# Patient Record
Sex: Female | Born: 1970
Health system: Southern US, Community
[De-identification: ages and names within clinical notes are randomized; demographics above are authoritative.]

## PROBLEM LIST (undated history)

## (undated) DIAGNOSIS — Z9884 Bariatric surgery status: Secondary | ICD-10-CM

## (undated) DIAGNOSIS — B2790 Infectious mononucleosis, unspecified without complication: Secondary | ICD-10-CM

## (undated) DIAGNOSIS — G4730 Sleep apnea, unspecified: Secondary | ICD-10-CM

## (undated) DIAGNOSIS — M48061 Spinal stenosis, lumbar region without neurogenic claudication: Secondary | ICD-10-CM

## (undated) DIAGNOSIS — Z8619 Personal history of other infectious and parasitic diseases: Secondary | ICD-10-CM

## (undated) DIAGNOSIS — I10 Essential (primary) hypertension: Secondary | ICD-10-CM

## (undated) DIAGNOSIS — J45909 Unspecified asthma, uncomplicated: Secondary | ICD-10-CM

## (undated) DIAGNOSIS — E119 Type 2 diabetes mellitus without complications: Secondary | ICD-10-CM

## (undated) HISTORY — DX: Type 2 diabetes mellitus without complications: E11.9

## (undated) HISTORY — PX: BREAST REDUCTION SURGERY: SHX8

## (undated) HISTORY — DX: Essential (primary) hypertension: I10

## (undated) HISTORY — PX: OTHER SURGICAL HISTORY: SHX169

## (undated) HISTORY — PX: CERVICAL LAMINECTOMY: SHX94

## (undated) HISTORY — PX: REDUCTION MAMMAPLASTY: SUR839

## (undated) HISTORY — DX: Spinal stenosis, lumbar region without neurogenic claudication: M48.061

## (undated) HISTORY — PX: DIAGNOSTIC LAPAROSCOPY: SUR761

---

## 2011-08-21 HISTORY — PX: GASTRIC BYPASS: SHX52

## 2014-11-28 DIAGNOSIS — I10 Essential (primary) hypertension: Secondary | ICD-10-CM | POA: Insufficient documentation

## 2014-11-28 DIAGNOSIS — Z9884 Bariatric surgery status: Secondary | ICD-10-CM | POA: Insufficient documentation

## 2015-04-30 ENCOUNTER — Ambulatory Visit (INDEPENDENT_AMBULATORY_CARE_PROVIDER_SITE_OTHER): Payer: BLUE CROSS/BLUE SHIELD | Admitting: Sports Medicine

## 2015-04-30 ENCOUNTER — Ambulatory Visit (INDEPENDENT_AMBULATORY_CARE_PROVIDER_SITE_OTHER): Payer: BLUE CROSS/BLUE SHIELD

## 2015-04-30 ENCOUNTER — Encounter: Payer: Self-pay | Admitting: Sports Medicine

## 2015-04-30 VITALS — BP 107/71 | HR 76 | Ht 67.0 in | Wt 184.0 lb

## 2015-04-30 DIAGNOSIS — Z9889 Other specified postprocedural states: Secondary | ICD-10-CM | POA: Insufficient documentation

## 2015-04-30 DIAGNOSIS — M4322 Fusion of spine, cervical region: Secondary | ICD-10-CM | POA: Diagnosis not present

## 2015-04-30 DIAGNOSIS — I10 Essential (primary) hypertension: Secondary | ICD-10-CM

## 2015-04-30 DIAGNOSIS — Z0000 Encounter for general adult medical examination without abnormal findings: Secondary | ICD-10-CM

## 2015-04-30 DIAGNOSIS — M8588 Other specified disorders of bone density and structure, other site: Secondary | ICD-10-CM

## 2015-04-30 MED ORDER — PREDNISONE 50 MG PO TABS: ORAL_TABLET | ORAL | Status: DC

## 2015-04-30 MED ORDER — LOSARTAN POTASSIUM 100 MG PO TABS: 100.0000 mg | ORAL_TABLET | Freq: Every day | ORAL | Status: DC

## 2015-04-30 NOTE — Assessment & Plan Note (Signed)
Present, x-rays, formal physical therapy.  Return in one month, we'll probably add gabapentin, and get an MR for interventional planning if no better. Symptoms seem somewhat higher up.

## 2015-04-30 NOTE — Assessment & Plan Note (Signed)
Refilling losartan, we will watch this over the next several visits.

## 2015-04-30 NOTE — Progress Notes (Signed)
  Subjective:    CC: Establish care.   HPI:  Neck pain: With right radicular pain, history of C5-C7 ACDF, overall did well after surgery, however she continued to have right hand paresthesias. Unfortunately pain is worsened significantly over the past few months, and is worse with prolonged downgaze. Moderate, persistent. No lower extremity symptoms.  Essential hypertension: Needs a refill on losartan  Past medical history, Surgical history, Family history not pertinant except as noted below, Social history, Allergies, and medications have been entered into the medical record, reviewed, and no changes needed.   Review of Systems: No headache, visual changes, nausea, vomiting, diarrhea, constipation, dizziness, abdominal pain, skin rash, fevers, chills, night sweats, swollen lymph nodes, weight loss, chest pain, body aches, joint swelling, muscle aches, shortness of breath, mood changes, visual or auditory hallucinations.  Objective:    General: Well Developed, well nourished, and in no acute distress.  Neuro: Alert and oriented x3, extra-ocular muscles intact, sensation grossly intact.  HEENT: Normocephalic, atraumatic, pupils equal round reactive to light, neck supple, no masses, no lymphadenopathy, thyroid nonpalpable.  Skin: Warm and dry, no rashes noted.  Cardiac: Regular rate and rhythm, no murmurs rubs or gallops.  Respiratory: Clear to auscultation bilaterally. Not using accessory muscles, speaking in full sentences.  Abdominal: Soft, nontender, nondistended, positive bowel sounds, no masses, no organomegaly.  Neck: Negative spurling's Full neck range of motion Grip strength and sensation normal in bilateral hands Strength good C4 to T1 distribution No sensory change to C4 to T1 Reflexes are all somewhat hypoactive on the right compared to the left  Impression and Recommendations:    The patient was counselled, risk factors were discussed, anticipatory guidance given.

## 2015-04-30 NOTE — Assessment & Plan Note (Signed)
Routine blood work was done in December of last year, we will recheck blood work at the end of 2016

## 2015-05-09 ENCOUNTER — Ambulatory Visit (INDEPENDENT_AMBULATORY_CARE_PROVIDER_SITE_OTHER): Payer: BLUE CROSS/BLUE SHIELD | Admitting: Physical Therapy

## 2015-05-09 ENCOUNTER — Encounter: Payer: Self-pay | Admitting: Physical Therapy

## 2015-05-09 DIAGNOSIS — R52 Pain, unspecified: Secondary | ICD-10-CM | POA: Diagnosis not present

## 2015-05-09 DIAGNOSIS — R293 Abnormal posture: Secondary | ICD-10-CM

## 2015-05-09 DIAGNOSIS — M5382 Other specified dorsopathies, cervical region: Secondary | ICD-10-CM | POA: Diagnosis not present

## 2015-05-09 NOTE — Patient Instructions (Signed)
Shoulder Press    [place] [phone]   Press both shoulders down. Hold 5___ seconds. Repeat _10__ times.  Surface: floor   Copyright  VHI. All rights reserved.  Head Press With [place] chin SLIGHTLY toward chest, keep mouth closed. Feel weight on back of head. Increase weight by pressing head down. Hold _5__ seconds. Relax. Repeat _10__ times. Surface: floor   Scapula Adduction With Pectorals, Low   Stand in doorframe with palms against frame and arms at 45. Lean forward and squeeze shoulder blades. Hold _30__ seconds. Repeat _1__ times per session. Do _1__ sessions per day.  Scapula Adduction With Pectorals, Mid-Range   Stand in doorframe with palms against frame and arms at 90. Lean forward and squeeze shoulder blades. Hold _45__ seconds. Repeat _1__ times per session. Do __1_ sessions per day.  Shoulder Rotation: Double Arm   On back, knees bent, feet flat, elbows tucked at sides, bent 90, hands palms up. Pull hands apart and down toward floor, keeping elbows near sides. Hold momentarily. Slowly return to starting position. Repeat 2x10__ times. Band color __red____

## 2015-05-09 NOTE — Therapy (Signed)
[place], [place], [address] Phone: [phone]   Fax:  [phone]  Physical Therapy Evaluation  Patient Details  Name: Denise Gay MRN: [id number] Date of Birth: 03-14-1971 Referring Provider:  Silverio Decamp,*  Encounter Date: 05/09/2015      PT End of Session - 05/09/15 1650    Visit Number 1   Number of Visits 8   Date for PT Re-Evaluation 06/06/15   PT Start Time 6712   PT Stop Time 4580   PT Time Calculation (min) 46 min   Activity Tolerance Patient tolerated treatment well      Past Medical History  Diagnosis Date  . Hypertension   . Diabetes mellitus without complication     Past Surgical History  Procedure Laterality Date  . Gastric bypass  08/2011    There were no vitals filed for this visit.  Visit Diagnosis:  Weakness of neck - Plan: PT plan of care cert/re-cert  Pain of multiple sites - Plan: PT plan of care cert/re-cert  Abnormal posture - Plan: PT plan of care cert/re-cert      Subjective Assessment - 05/09/15 1609    Subjective Pt reports neck surgery in 2007 until 5 months began having tingling into Rt UE into 2nd and 3rd finger, most of the pain is in the upper trap area.    Pertinent History prednisone x 5 days, Saturday and the pain is returning.    How long can you sit comfortably? no limitations except for turning her head.    How long can you walk comfortably? no limitations   Diagnostic tests x-ray   Patient Stated Goals pt wishes to figure out whats wrong, decrease pain and prevent surgery.    Currently in Pain? Yes   Pain Score 2    Pain Location Arm   Pain Orientation Right   Pain Descriptors / Indicators Stabbing;Numbness   Pain Type Chronic pain            [facility] PT Assessment - 05/09/15 0001    Assessment   Medical Diagnosis h/o cervical discectomy   Onset Date/Surgical Date 12/09/14   Hand Dominance Left   Next MD Visit  05/28/15   Prior Therapy not for this    Precautions   Precautions None   Balance Screen   Has the patient fallen in the past 6 months No   Has the patient had a decrease in activity level because of a fear of falling?  No   Is the patient reluctant to leave their home because of a fear of falling?  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    Leisure go to [employer], swim.    Observation/Other Assessments   Focus on Therapeutic Outcomes (FOTO)  57% limited   Posture/Postural Control   Posture/Postural Control Postural limitations   Postural Limitations Rounded Shoulders;Forward head   ROM / Strength   AROM / PROM / Strength AROM;Strength   AROM   Overall AROM  --  bilat UE's WNL   AROM Assessment Site Cervical   Cervical Flexion 55   Cervical Extension 35   Cervical - Right Side Bend 25  pain at endrange   Cervical - Left Side Bend 20   Cervical - Right Rotation 45   Cervical - Left Rotation 55  Strength   Overall Strength --  bilat UE's WNL   Overall Strength Comments mid traps Rt 4+/5, Lt 4/5, low traps 4/5   Palpation   Palpation comment significant tightness and tenderness in Rt upper trap/levator   Special Tests    Special Tests Cervical   Cervical Tests Spurling's   Spurling's   Findings Negative                   [facility] Adult PT Treatment/Exercise - 05/09/15 0001    Exercises   Exercises Neck   Neck Exercises: Theraband   Shoulder External Rotation Red;20 reps  in hooklying   Neck Exercises: Standing   Other Standing Exercises door way stretch low and mid   Neck Exercises: Supine   Cervical Isometrics 10 reps  head presses   Other Supine Exercise 10 reps shoulder presses   Modalities   Modalities Ultrasound   Ultrasound   Ultrasound Location Rt upper trap/levator   Ultrasound Parameters combo, 100% 3.35mhz,  1.5w/cm2   Ultrasound Goals Pain  tightness                PT Education - 05/09/15 1631    Education provided Yes   Education Details HEP and work station set up   [employer]) Educated Patient   Methods Explanation;Demonstration;Handout   Comprehension Verbalized understanding;Returned demonstration             PT Long Term Goals - 05/09/15 1654    PT LONG TERM GOAL #1   Title I with postural ther ex ( 06/06/15)   Time 4   Period Weeks   Status New   PT LONG TERM GOAL #2   Title increase strength upper back =/> 5-/5 ( 06/06/15)   Time 4   Period Weeks   Status New   PT LONG TERM GOAL #3   Title report pain decrease =/> 75 % in Rt UE ( 06/06/15)   Time 4   Period Weeks   Status New   PT LONG TERM GOAL #4   Title improve FOTO =/< 41% limited ( 06/06/15)    Time 4   Period Weeks   Status New   PT LONG TERM GOAL #5   Title increase cervical rotation biilat =/> 60 degrees ( 06/06/15)   Time 4   Period Weeks   Status New               Plan - 05/09/15 1651    Clinical Impression Statement 44 y/o female presents with postural changes, weakness and trigger points that are pulling on her Rt upper shoulder and possibly causing nerve pinching and symptoms into her Rt UE. 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   Pt will benefit from skilled therapeutic intervention in order to improve on the following deficits Postural dysfunction;Decreased strength;Pain;Decreased range of motion   Rehab Potential Good   PT Frequency 2x / week   PT Duration 4 weeks   PT Treatment/Interventions Ultrasound;Moist Heat;Therapeutic exercise;Taping;Manual techniques;Neuromuscular re-education;Cryotherapy;Electrical Stimulation;Patient/family education;Passive range of motion   PT Next Visit Plan progress postural realignment, TPR    Consulted and Agree with Plan of Care Patient         Problem List Patient Active Problem List   Diagnosis Date Noted  . H/O C5-C7  ACDF with adjacent level disease 04/30/2015  . Annual physical exam 04/30/2015  . History of Roux-en-Y gastric bypass 11/28/2014  . Essential hypertension, benign 11/28/2014    Manuela Schwartz  Charlii Yost PT  05/09/2015, 4:58 PM  [facility] [place], [place], [address] Phone: [phone]   Fax:  [phone]

## 2015-05-14 ENCOUNTER — Ambulatory Visit (INDEPENDENT_AMBULATORY_CARE_PROVIDER_SITE_OTHER): Payer: BLUE CROSS/BLUE SHIELD | Admitting: Physical Therapy

## 2015-05-14 ENCOUNTER — Encounter: Payer: Self-pay | Admitting: Physical Therapy

## 2015-05-14 DIAGNOSIS — R293 Abnormal posture: Secondary | ICD-10-CM | POA: Diagnosis not present

## 2015-05-14 DIAGNOSIS — M5382 Other specified dorsopathies, cervical region: Secondary | ICD-10-CM | POA: Diagnosis not present

## 2015-05-14 DIAGNOSIS — R52 Pain, unspecified: Secondary | ICD-10-CM | POA: Diagnosis not present

## 2015-05-14 NOTE — Therapy (Signed)
[place], [place], [address] Phone: [phone]   Fax:  [phone]  Physical Therapy Treatment  Patient Details  Name: Denise Gay MRN: [id number] Date of Birth: Jan 03, 1971 Referring Provider:  Silverio Decamp,*  Encounter Date: 05/14/2015      PT End of Session - 05/14/15 1558    Visit Number 2   Number of Visits 8   Date for PT Re-Evaluation 06/06/15   PT Start Time 8811   PT Stop Time 0315   PT Time Calculation (min) 54 min      Past Medical History  Diagnosis Date  . Hypertension   . Diabetes mellitus without complication     Past Surgical History  Procedure Laterality Date  . Gastric bypass  08/2011    There were no vitals filed for this visit.  Visit Diagnosis:  Weakness of neck  Pain of multiple sites  Abnormal posture      Subjective Assessment - 05/14/15 1558    Subjective Pt reports she is having pain on both sides of her neck.  It began Friday. Doesn 't feel like she can do too much for her work station .    Currently in Pain? Yes   Pain Score 5    Pain Location Neck   Pain Orientation Left;Right   Pain Descriptors / Indicators Sharp;Burning   Pain Radiating Towards into both arms Rt to fingers, Lt just below the elbow   Pain Onset In the past 7 days   Aggravating Factors  working on her computer monitors.    Pain Relieving Factors moved TV to be in front of her            [facility] PT Assessment - 05/14/15 0001    Assessment   Medical Diagnosis h/o cervical discectomy   Onset Date/Surgical Date 12/09/14   Next MD Visit 05/28/15   AROM   Cervical - Right Rotation 56   Cervical - Left Rotation 50                     [facility] Adult PT Treatment/Exercise - 05/14/15 0001    Neck Exercises: Machines for Strengthening   UBE (Upper Arm Bike) L1 x 2'/2'   Neck Exercises: Theraband   Other Theraband Exercises in hooklying, with yellow band.  overrhead, horizontal abduction, SASH, increased difficulty with Lt side today.    Neck Exercises: Supine   Other Supine Exercise 20 reps thoracic lifts   Ultrasound   Ultrasound Location Lt and Rt upper trap   Ultrasound Parameters combo, 7' each side, 100%   Ultrasound Goals Pain  tightness   Manual Therapy   Manual Therapy Soft tissue mobilization   Soft tissue mobilization bilat UT and levators, STW to bilat pecs and cervical muscle stretching                     PT Long Term Goals - 05/14/15 1558    PT LONG TERM GOAL #1   Title I with postural ther ex ( 06/06/15)   Status On-going   PT LONG TERM GOAL #2   Title increase strength upper back =/> 5-/5 ( 06/06/15)   Status On-going   PT LONG TERM GOAL #3   Title report pain decrease =/> 75 % in Rt UE ( 06/06/15)   Status On-going   PT LONG TERM GOAL #4   Title improve FOTO =/< 41% limited ( 06/06/15)  Status On-going   PT LONG TERM GOAL #5   Title increase cervical rotation biilat =/> 60 degrees ( 06/06/15)   Status On-going               Plan - 05/14/15 1657    Clinical Impression Statement Pt had increased pain over the weekend however she also had increased stress as she was fostering a cat.  She is tight in bilat upper traps Rt > Lt, No goals met, only second visit   Pt will benefit from skilled therapeutic intervention in order to improve on the following deficits Postural dysfunction;Decreased strength;Pain;Decreased range of motion   Rehab Potential Good   PT Frequency 2x / week   PT Duration 4 weeks   PT Next Visit Plan progress postural realignment, TPR    Consulted and Agree with Plan of Care Patient        Problem List Patient Active Problem List   Diagnosis Date Noted  . H/O C5-C7 ACDF with adjacent level disease 04/30/2015  . Annual physical exam 04/30/2015  . History of Roux-en-Y gastric bypass 11/28/2014  . Essential hypertension, benign 11/28/2014    Jeral Pinch PT 05/14/2015,  5:01 PM  [facility] [place], [place], [address] Phone: [phone]   Fax:  [phone]

## 2015-05-16 ENCOUNTER — Ambulatory Visit (INDEPENDENT_AMBULATORY_CARE_PROVIDER_SITE_OTHER): Payer: BLUE CROSS/BLUE SHIELD | Admitting: Rehabilitative and Restorative Service Providers"

## 2015-05-16 ENCOUNTER — Encounter: Payer: Self-pay | Admitting: Rehabilitative and Restorative Service Providers"

## 2015-05-16 DIAGNOSIS — M5382 Other specified dorsopathies, cervical region: Secondary | ICD-10-CM

## 2015-05-16 DIAGNOSIS — R52 Pain, unspecified: Secondary | ICD-10-CM | POA: Diagnosis not present

## 2015-05-16 DIAGNOSIS — R293 Abnormal posture: Secondary | ICD-10-CM | POA: Diagnosis not present

## 2015-05-16 NOTE — Therapy (Signed)
[place], [place], [address] Phone: [phone]   Fax:  [phone]  Physical Therapy Treatment  Patient Details  Name: Denise Gay MRN: [id number] Date of Birth: February 08, 1971 Referring Provider:  Silverio Decamp,*  Encounter Date: 05/16/2015      PT End of Session - 05/16/15 1202    Visit Number 3   Number of Visits 8   Date for PT Re-Evaluation 06/06/15   PT Start Time 0935   PT Stop Time 1032   PT Time Calculation (min) 57 min   Activity Tolerance Patient tolerated treatment well      Past Medical History  Diagnosis Date  . Hypertension   . Diabetes mellitus without complication     Past Surgical History  Procedure Laterality Date  . Gastric bypass  08/2011    There were no vitals filed for this visit.  Visit Diagnosis:  Weakness of neck  Pain of multiple sites  Abnormal posture      Subjective Assessment - 05/16/15 1133    Subjective Pain in the Lt more than the Rt today. She has requested an evaluation of her work station. Will see ophthalmologist to ask about computer glasses to decrease axial extension wihen at [facility] station.   Currently in Pain? Yes   Pain Score 5    Pain Location Neck   Pain Orientation Left;Right   Pain Descriptors / Indicators Sharp;Burning   Pain Type Chronic pain   Pain Onset More than a month ago   Pain Frequency Constant             [facility] Adult PT Treatment/Exercise - 05/16/15 0001    Self-Care   Self-Care --  education re head position/glasses at computer   Neuro Re-ed    Neuro Re-ed Details  working on posture and alignment; positioin of head and neck; chin tuck; chest lift; posture   Neck Exercises: Standing   Other Standing Exercises doorway 3 positioins 3 reps 20 sec each   Other Standing Exercises scap squeeze around noodle/work on posture   Neck Exercises: Supine   Neck Retraction 10 reps;10 secs  difficulty  achieving neutral spine position   Cryotherapy   Number Minutes Cryotherapy 12 Minutes   Cryotherapy Location Cervical   Type of Cryotherapy Ice pack   Ultrasound   Ultrasound Location Lt/Rtposterior/lateral cervical musculature; upper trap   Ultrasound Parameters 1.5 w/cm2; 100% 1 MHz   Ultrasound Goals Pain   Manual Therapy   Manual Therapy Soft tissue mobilization;Myofascial release;Manual Traction   Soft tissue mobilization ant/lat/post cervical musculature; pecs; upper traps   Myofascial Release chest; traps; leveator   Manual Traction cervical traction working on neutral position for chin/head/neck                PT Education - 05/16/15 1200    Education provided Yes   Education Details suggestions for glasses for work; postural correction; chin tuck; chest lift; stretch for pec in doorway; passive pec stretch with noodle in standing; golf balls for occipital inhibition   Person(s) Educated Patient   Methods Explanation;Demonstration;Tactile cues;Verbal cues;Handout   Comprehension Verbalized understanding;Returned demonstration;Verbal cues required;Tactile cues required             PT Long Term Goals - 05/14/15 1558    PT LONG TERM GOAL #1   Title I with postural ther ex ( 06/06/15)   Status On-going   PT LONG TERM GOAL #2   Title increase strength upper  back =/> 5-/5 ( 06/06/15)   Status On-going   PT LONG TERM GOAL #3   Title report pain decrease =/> 75 % in Rt UE ( 06/06/15)   Status On-going   PT LONG TERM GOAL #4   Title improve FOTO =/< 41% limited ( 06/06/15)    Status On-going   PT LONG TERM GOAL #5   Title increase cervical rotation biilat =/> 60 degrees ( 06/06/15)   Status On-going               Plan - 05/16/15 1203    Clinical Impression Statement Continued pain in neck and shoulders, Lt > Rt. Discussed posture and alignment of head and neck at work. Pt has bifocals and likely tips head to see computer screen creating static position of  neck. 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 Education re-posture and alignment; HEP.   Pt will benefit from skilled therapeutic intervention in order to improve on the following deficits Postural dysfunction;Decreased strength;Pain;Decreased range of motion   Rehab Potential Good   PT Frequency 2x / week   PT Duration 4 weeks   PT Treatment/Interventions Ultrasound;Moist Heat;Therapeutic exercise;Taping;Manual techniques;Neuromuscular re-education;Cryotherapy;Electrical Stimulation;Patient/family education;Passive range of motion   PT Next Visit Plan continue work on postural correction; axial extension; pec stretches; posterior shoulcer girdel strengthening; manual work C-spine   PT Home Exercise Plan postural work; HEP   Consulted and Agree with Plan of Care Patient        Problem List Patient Active Problem List   Diagnosis Date Noted  . H/O C5-C7 ACDF with adjacent level disease 04/30/2015  . Annual physical exam 04/30/2015  . History of Roux-en-Y gastric bypass 11/28/2014  . Essential hypertension, benign 11/28/2014    Everardo All, PT, MPH 05/16/2015, 12:10 PM  [facility] [place], [place], [address] Phone: [phone]   Fax:  [phone]

## 2015-05-16 NOTE — Patient Instructions (Signed)
Lying on back - golf balls at skull on either side of spine 2-5 min    Scapula Adduction With Pectorals, Low   Stand in doorframe with palms against frame and arms at 45. Lean forward and squeeze shoulder blades. Hold __20-30_ seconds. Repeat _3__ times per session. Do _3-4__ sessions per day.  Copyright  VHI. All rights reserved.    Scapula Adduction With Pectorals, Mid-Range   Stand in doorframe with palms against frame and arms at 90. Lean forward and squeeze shoulder blades. Hold _20-30__ seconds. Repeat _3__ times per session. Do _3-4__ sessions per day. \Scapula Adduction With Pectorals, High   Stand in doorframe with palms against frame and arms at 120. Lean forward and squeeze shoulder blades. Hold _20-30__ seconds. Repeat _3__ times per session. Do _3-4__ sessions per day.

## 2015-05-17 ENCOUNTER — Encounter: Payer: BLUE CROSS/BLUE SHIELD | Admitting: Physical Therapy

## 2015-05-21 ENCOUNTER — Ambulatory Visit (INDEPENDENT_AMBULATORY_CARE_PROVIDER_SITE_OTHER): Payer: BLUE CROSS/BLUE SHIELD | Admitting: Physical Therapy

## 2015-05-21 DIAGNOSIS — R293 Abnormal posture: Secondary | ICD-10-CM | POA: Diagnosis not present

## 2015-05-21 DIAGNOSIS — M5382 Other specified dorsopathies, cervical region: Secondary | ICD-10-CM

## 2015-05-21 DIAGNOSIS — R52 Pain, unspecified: Secondary | ICD-10-CM

## 2015-05-21 NOTE — Patient Instructions (Signed)
.  Sash   On back, knees bent, feet flat, left hand on left hip, right hand above left. Pull right arm DIAGONALLY (hip to shoulder) across chest. Bring right arm along head toward floor. Hold momentarily. Slowly return to starting position. Repeat _10__ times. Do with left arm. Band color _yellow_____  (thumb pointed up)   Resisted Horizontal Abduction: Bilateral   While on back, place tubing in both hands, arms out in front. Keeping arms straight, pinch shoulder blades together and stretch arms out. Repeat _10___ times per set. Do __2__ sets per session. Do __1__ sessions per day. Yellow band

## 2015-05-21 NOTE — Therapy (Signed)
[place], [place], [address] Phone: [phone]   Fax:  [phone]  Physical Therapy Treatment  Patient Details  Name: Denise Gay MRN: [id number] Date of Birth: 01-12-1971 Referring Provider:  Silverio Decamp,*  Encounter Date: 05/21/2015      PT End of Session - 05/21/15 1602    Visit Number 4   Number of Visits 8   Date for PT Re-Evaluation 06/06/15   PT Start Time 1602   PT Stop Time 1702   PT Time Calculation (min) 60 min      Past Medical History  Diagnosis Date  . Hypertension   . Diabetes mellitus without complication     Past Surgical History  Procedure Laterality Date  . Gastric bypass  08/2011    There were no vitals filed for this visit.  Visit Diagnosis:  Weakness of neck  Pain of multiple sites  Abnormal posture      Subjective Assessment - 05/21/15 1603    Subjective Pt reports she is still experiencing pain in neck and tingling down to fingers (1st/2nd finger Rt hand, 3rd/4th finger on Lt).  "Not as bad as last week". Pt reports she needs a MD note to have work station assessed.    Currently in Pain? Yes   Pain Score 2    Pain Location Neck   Pain Orientation Right;Left;Lower   Pain Descriptors / Indicators Burning;Sharp   Aggravating Factors  working on computer    Pain Relieving Factors hot tub use, massage, medicine.             [facility] PT Assessment - 05/21/15 0001    Assessment   Medical Diagnosis h/o cervical discectomy   Onset Date/Surgical Date 12/09/14   Hand Dominance Left   Next MD Visit 05/28/15   AROM   AROM Assessment Site Cervical   Cervical Flexion 45   Cervical Extension 50   Cervical - Right Side Bend 35   Cervical - Left Side Bend 35   Cervical - Right Rotation 65   Cervical - Left Rotation 55                     [facility] Adult PT Treatment/Exercise - 05/21/15 0001    Exercises   Exercises Neck   Neck  Exercises: Machines for Strengthening   UBE (Upper Arm Bike) L1: 1 min each way up to 4 min .   Neck Exercises: Theraband   Shoulder External Rotation 10 reps;Red  2 sets, supine   Horizontal ABduction 10 reps  yellow band, supine. 2 sets   Other Theraband Exercises In hooklying: yellow band x 10 reps for bilateral shoulder flex, sash each arm - no irritation to next this time.    Neck Exercises: Standing   Other Standing Exercises scap squeeze around noodle/work on posture- 5 sec hold x 10    Modalities   Modalities Cryotherapy   Cryotherapy   Number Minutes Cryotherapy 12 Minutes   Cryotherapy Location Cervical   Type of Cryotherapy Ice pack   Manual Therapy   Manual Therapy Soft tissue mobilization;Myofascial release;Manual Traction   Soft tissue mobilization ant/lat/post cervical musculature;  upper traps   Manual Traction cervical traction working on neutral position for chin/head/neck   Neck Exercises: Stretches   Upper Trapezius Stretch 3 reps;20 seconds   Corner Stretch 2 reps;20 seconds   Corner Stretch Limitations doorway stretch 3 angles bilateral arms; required multiple cues to correct form.  PT Education - 05/21/15 1702    Education provided Yes   Education Details HEP    Person(s) Educated Patient   Methods Explanation;Handout;Demonstration   Comprehension Returned demonstration;Verbalized understanding;Tactile cues required             PT Long Term Goals - 05/14/15 1558    PT LONG TERM GOAL #1   Title I with postural ther ex ( 06/06/15)   Status On-going   PT LONG TERM GOAL #2   Title increase strength upper back =/> 5-/5 ( 06/06/15)   Status On-going   PT LONG TERM GOAL #3   Title report pain decrease =/> 75 % in Rt UE ( 06/06/15)   Status On-going   PT LONG TERM GOAL #4   Title improve FOTO =/< 41% limited ( 06/06/15)    Status On-going   PT LONG TERM GOAL #5   Title increase cervical rotation biilat =/> 60 degrees ( 06/06/15)    Status On-going               Plan - 05/21/15 1634    Clinical Impression Statement Pt demo improved cervical ROM this visit. Pt able to tolerate ther ex better than 2 visits ago; no reported increase in pain.  Making good gains towards goals.    Pt will benefit from skilled therapeutic intervention in order to improve on the following deficits Postural dysfunction;Decreased strength;Pain;Decreased range of motion   Rehab Potential Good   PT Frequency 2x / week   PT Duration 4 weeks   PT Treatment/Interventions Ultrasound;Moist Heat;Therapeutic exercise;Taping;Manual techniques;Neuromuscular re-education;Cryotherapy;Electrical Stimulation;Patient/family education;Passive range of motion   PT Next Visit Plan continue work on postural correction; axial extension; pec stretches; posterior shoulder girdel strengthening; manual work C-spine   Consulted and Agree with Plan of Care Patient        Problem List Patient Active Problem List   Diagnosis Date Noted  . H/O C5-C7 ACDF with adjacent level disease 04/30/2015  . Annual physical exam 04/30/2015  . History of Roux-en-Y gastric bypass 11/28/2014  . Essential hypertension, benign 11/28/2014    Kerin Perna, PTA 05/21/2015 5:09 PM  [place], [place], [address] Phone: [phone]   Fax:  [phone]

## 2015-05-23 ENCOUNTER — Encounter: Payer: Self-pay | Admitting: Physical Therapy

## 2015-05-23 ENCOUNTER — Ambulatory Visit (INDEPENDENT_AMBULATORY_CARE_PROVIDER_SITE_OTHER): Payer: BLUE CROSS/BLUE SHIELD | Admitting: Physical Therapy

## 2015-05-23 DIAGNOSIS — R52 Pain, unspecified: Secondary | ICD-10-CM

## 2015-05-23 DIAGNOSIS — R293 Abnormal posture: Secondary | ICD-10-CM | POA: Diagnosis not present

## 2015-05-23 DIAGNOSIS — M5382 Other specified dorsopathies, cervical region: Secondary | ICD-10-CM | POA: Diagnosis not present

## 2015-05-23 NOTE — Therapy (Addendum)
[place], [place], [address] Phone: [phone]   Fax:  [phone]  Physical Therapy Treatment  Patient Details  Name: Joliet Mallozzi MRN: [id number] Date of Birth: 04-May-1971 Referring Provider:  Silverio Decamp,*  Encounter Date: 05/23/2015      PT End of Session - 05/23/15 1605    Visit Number 5   Number of Visits 8   Date for PT Re-Evaluation 06/06/15   PT Start Time 9163   PT Stop Time 8466   PT Time Calculation (min) 49 min      Past Medical History  Diagnosis Date  . Hypertension   . Diabetes mellitus without complication     Past Surgical History  Procedure Laterality Date  . Gastric bypass  08/2011    There were no vitals filed for this visit.  Visit Diagnosis:  Weakness of neck  Pain of multiple sites  Abnormal posture      Subjective Assessment - 05/23/15 1605    Subjective Pt reports tingling is still into her fingers, the pain is worse today.  Was fine over the weekend.    Currently in Pain? Yes   Pain Score 4    Pain Location Neck   Pain Orientation Right   Pain Type Chronic pain   Pain Radiating Towards neck and tingling constant, arm pain intermittent.    Aggravating Factors  working on the computer   Pain Relieving Factors med, hot tub.                          [facility] Adult PT Treatment/Exercise - 05/23/15 0001    Neck Exercises: Machines for Strengthening   UBE (Upper Arm Bike) L2x 2'/2'   Neck Exercises: Standing   Other Standing Exercises scap squeeze around noodle/work on posture- FWD reach with arms to 90 degrees, the horizontal abduction.    Modalities   Modalities Cryotherapy;Electrical Stimulation   Cryotherapy   Number Minutes Cryotherapy 15 Minutes   Cryotherapy Location Cervical   Type of Cryotherapy Ice pack   Electrical Stimulation   Electrical Stimulation Location cervical    Electrical Stimulation Action IFC    Electrical Stimulation Parameters to tolerance   Electrical Stimulation Goals Pain   Manual Therapy   Manual Therapy Soft tissue mobilization;Myofascial release;Manual Traction   Soft tissue mobilization ant/lat/post cervical musculature;  upper traps   Myofascial Release chest; traps; leveator   Manual Traction cervical traction working on neutral position for chin/head/neck                     PT Long Term Goals - 05/14/15 1558    PT LONG TERM GOAL #1   Title I with postural ther ex ( 06/06/15)   Status On-going   PT LONG TERM GOAL #2   Title increase strength upper back =/> 5-/5 ( 06/06/15)   Status On-going   PT LONG TERM GOAL #3   Title report pain decrease =/> 75 % in Rt UE ( 06/06/15)   Status On-going   PT LONG TERM GOAL #4   Title improve FOTO =/< 41% limited ( 06/06/15)    Status On-going   PT LONG TERM GOAL #5   Title increase cervical rotation biilat =/> 60 degrees ( 06/06/15)   Status On-going               Plan - 05/23/15 1641    Clinical Impression Statement Pt  reports some increased symptoms in her neck today. Felt really good after last Wed tx and into the weekend.  She is planning on making an appointment with the eye MD to see about computer glassess.    Pt will benefit from skilled therapeutic intervention in order to improve on the following deficits Postural dysfunction;Decreased strength;Pain;Decreased range of motion   Rehab Potential Good   PT Frequency 2x / week   PT Duration 4 weeks   PT Treatment/Interventions Ultrasound;Moist Heat;Therapeutic exercise;Taping;Manual techniques;Neuromuscular re-education;Cryotherapy;Electrical Stimulation;Patient/family education;Passive range of motion   PT Next Visit Plan continue work on postural correction; axial extension; pec stretches; posterior shoulcer girdel strengthening; manual work C-spine   PT Home Exercise Plan postural work; HEP   Consulted and Agree with Plan of Care Patient         Problem List Patient Active Problem List   Diagnosis Date Noted  . H/O C5-C7 ACDF with adjacent level disease 04/30/2015  . Annual physical exam 04/30/2015  . History of Roux-en-Y gastric bypass 11/28/2014  . Essential hypertension, benign 11/28/2014    Jeral Pinch PT  05/23/2015, 4:43 PM  [facility] [place], [place], [address] Phone: [phone]   Fax:  [phone]  PHYSICAL THERAPY DISCHARGE SUMMARY  Visits from Start of Care: 5  Current functional level related to goals / functional outcomes: See above   Remaining deficits: See above   Education / Equipment: Initial HEP Plan: Patient agrees to discharge.  Patient goals were not met. Patient is being discharged due to a change in medical status. She is going to have an MRI later in September ?????        Jeral Pinch, PT 06/20/2015 4:33 PM

## 2015-05-28 ENCOUNTER — Encounter: Payer: Self-pay | Admitting: Sports Medicine

## 2015-05-28 ENCOUNTER — Ambulatory Visit (INDEPENDENT_AMBULATORY_CARE_PROVIDER_SITE_OTHER): Payer: BLUE CROSS/BLUE SHIELD | Admitting: Sports Medicine

## 2015-05-28 ENCOUNTER — Encounter: Payer: BLUE CROSS/BLUE SHIELD | Admitting: Physical Therapy

## 2015-05-28 DIAGNOSIS — Z9889 Other specified postprocedural states: Secondary | ICD-10-CM

## 2015-05-28 LAB — BASIC METABOLIC PANEL
BUN: 13 mg/dL (ref 7–25)
CO2: 23 mmol/L (ref 20–31)
Calcium: 8.9 mg/dL (ref 8.6–10.2)
Chloride: 106 mmol/L (ref 98–110)
Creat: 0.77 mg/dL (ref 0.50–1.10)
Glucose, Bld: 82 mg/dL (ref 65–99)
Potassium: 4.5 mmol/L (ref 3.5–5.3)
Sodium: 138 mmol/L (ref 135–146)

## 2015-05-28 MED ORDER — GABAPENTIN 300 MG PO CAPS: ORAL_CAPSULE | ORAL | Status: DC

## 2015-05-28 NOTE — Progress Notes (Signed)
  Subjective:    CC: Follow-up  HPI: Denise Gay returns, she is a pleasant 44 year old female with a C5-C7 ACDF, currently having C5 type radiculopathy. She has been through formal physical therapy, oral medications, unfortunately continues to have pain. Moderate, persistent with radiation down the left shoulder in a C5 versus C4 distribution. No lower acuity symptoms or bowel or bladder dysfunction, no saddle numbness.  Past medical history, Surgical history, Family history not pertinant except as noted below, Social history, Allergies, and medications have been entered into the medical record, reviewed, and no changes needed.   Review of Systems: No fevers, chills, night sweats, weight loss, chest pain, or shortness of breath.   Objective:    General: Well Developed, well nourished, and in no acute distress.  Neuro: Alert and oriented x3, extra-ocular muscles intact, sensation grossly intact.  HEENT: Normocephalic, atraumatic, pupils equal round reactive to light, neck supple, no masses, no lymphadenopathy, thyroid nonpalpable.  Skin: Warm and dry, no rashes. Cardiac: Regular rate and rhythm, no murmurs rubs or gallops, no lower extremity edema.  Respiratory: Clear to auscultation bilaterally. Not using accessory muscles, speaking in full sentences.  X-rays reviewed and do show a C3-4 and C4-5 adjacent level disease. There is solid fusion from C5-C7.  Impression and Recommendations:    I spent 25 minutes with this patient, greater than 50% was face-to-face time counseling regarding the above diagnoses

## 2015-05-28 NOTE — Assessment & Plan Note (Signed)
Symptoms do likely represent C5 radiculopathy versus C4 radiculopathy, she does have adjacent level disease at the C4-C5 level as well as the C3-C4 level. Physical therapy seems to be worsening symptoms, at this point we are going to obtain an MRI with contrast, as well as start gabapentin. Return to see me to go over MRI.

## 2015-05-30 ENCOUNTER — Encounter: Payer: BLUE CROSS/BLUE SHIELD | Admitting: Physical Therapy

## 2015-06-04 ENCOUNTER — Encounter: Payer: BLUE CROSS/BLUE SHIELD | Admitting: Physical Therapy

## 2015-06-04 ENCOUNTER — Telehealth: Payer: Self-pay

## 2015-06-04 NOTE — Telephone Encounter (Signed)
Patient called stated that she has a  Cervical spine MRI scheduled for Saturday. She request a medication for anxiety sent to her pharmacy. Johnny Latu,CMA

## 2015-06-05 MED ORDER — DIAZEPAM 5 MG PO TABS: ORAL_TABLET | ORAL | Status: DC

## 2015-06-05 NOTE — Telephone Encounter (Signed)
Prescription for Valium is in my box, she will need a driver

## 2015-06-05 NOTE — Telephone Encounter (Signed)
Rx is in provider box waiting for a signature. Jeanell Mangan,CMA

## 2015-06-06 ENCOUNTER — Encounter: Payer: BLUE CROSS/BLUE SHIELD | Admitting: Physical Therapy

## 2015-06-09 ENCOUNTER — Ambulatory Visit (HOSPITAL_BASED_OUTPATIENT_CLINIC_OR_DEPARTMENT_OTHER): Payer: BLUE CROSS/BLUE SHIELD

## 2015-07-14 ENCOUNTER — Ambulatory Visit (HOSPITAL_BASED_OUTPATIENT_CLINIC_OR_DEPARTMENT_OTHER): Payer: BLUE CROSS/BLUE SHIELD

## 2016-01-15 ENCOUNTER — Other Ambulatory Visit: Payer: Self-pay | Admitting: Sports Medicine

## 2016-01-15 ENCOUNTER — Encounter: Payer: Self-pay | Admitting: Sports Medicine

## 2016-01-15 ENCOUNTER — Ambulatory Visit (HOSPITAL_COMMUNITY)
Admission: RE | Admit: 2016-01-15 | Discharge: 2016-01-15 | Disposition: A | Discharge status: Home / Self Care | Payer: BLUE CROSS/BLUE SHIELD | Source: Ambulatory Visit | Attending: Sports Medicine | Admitting: Sports Medicine

## 2016-01-15 ENCOUNTER — Ambulatory Visit (INDEPENDENT_AMBULATORY_CARE_PROVIDER_SITE_OTHER): Payer: BLUE CROSS/BLUE SHIELD | Admitting: Sports Medicine

## 2016-01-15 ENCOUNTER — Ambulatory Visit (INDEPENDENT_AMBULATORY_CARE_PROVIDER_SITE_OTHER): Payer: BLUE CROSS/BLUE SHIELD

## 2016-01-15 ENCOUNTER — Telehealth: Payer: Self-pay | Admitting: Sports Medicine

## 2016-01-15 VITALS — BP 152/83 | HR 80 | Resp 18 | Wt 195.8 lb

## 2016-01-15 DIAGNOSIS — G959 Disease of spinal cord, unspecified: Secondary | ICD-10-CM

## 2016-01-15 DIAGNOSIS — M6281 Muscle weakness (generalized): Secondary | ICD-10-CM | POA: Insufficient documentation

## 2016-01-15 DIAGNOSIS — R531 Weakness: Secondary | ICD-10-CM

## 2016-01-15 DIAGNOSIS — M5126 Other intervertebral disc displacement, lumbar region: Secondary | ICD-10-CM | POA: Diagnosis not present

## 2016-01-15 DIAGNOSIS — M4806 Spinal stenosis, lumbar region: Secondary | ICD-10-CM | POA: Insufficient documentation

## 2016-01-15 DIAGNOSIS — M545 Low back pain: Secondary | ICD-10-CM

## 2016-01-15 DIAGNOSIS — M5136 Other intervertebral disc degeneration, lumbar region: Secondary | ICD-10-CM | POA: Insufficient documentation

## 2016-01-15 MED ORDER — CYCLOBENZAPRINE HCL 10 MG PO TABS: ORAL_TABLET | ORAL | Status: DC

## 2016-01-15 MED ORDER — MELOXICAM 15 MG PO TABS: ORAL_TABLET | ORAL | Status: DC

## 2016-01-15 MED ORDER — DIAZEPAM 5 MG PO TABS: ORAL_TABLET | ORAL | Status: DC

## 2016-01-15 MED ORDER — PREDNISONE 50 MG PO TABS: ORAL_TABLET | ORAL | Status: DC

## 2016-01-15 NOTE — Addendum Note (Signed)
Addended by: Silverio Decamp on: 01/15/2016 04:51 PM   Modules accepted: Orders

## 2016-01-15 NOTE — Telephone Encounter (Signed)
Patient's husband called request to know if you can fill out a handicap form or give something for patient to take to work for her leg pain. She is not able to walk far and her husband said she has to go to work tomorrow and needs to be able to park close. He said she can hardly work and she had an emergency mri scheduled today. The husband Shanon Brow) said she needs it today if possible. [phone] Thanks

## 2016-01-15 NOTE — Telephone Encounter (Signed)
Okay thank you Dr. Darene Lamer I will call the patient and let her know. Thanks

## 2016-01-15 NOTE — Progress Notes (Signed)
  Subjective:    CC: back pain and leg weakness  HPI: For the past several Weeks this pleasant 45 year old female has had worsening back pain, worse with standing, worse with Valsalva, with radiation down into the thighs but also progressive weakness in both legs particularly to dorsiflexion of both feet, no bowel or bladder dysfunction, no saddle numbness, no constitutional symptoms, no trauma. She did just back from a cruise.  Past medical history, Surgical history, Family history not pertinant except as noted below, Social history, Allergies, and medications have been entered into the medical record, reviewed, and no changes needed.   Review of Systems: No fevers, chills, night sweats, weight loss, chest pain, or shortness of breath.   Objective:    General: Well Developed, well nourished, and in no acute distress.  Neuro: Alert and oriented x3, extra-ocular muscles intact, sensation grossly intact.  HEENT: Normocephalic, atraumatic, pupils equal round reactive to light, neck supple, no masses, no lymphadenopathy, thyroid nonpalpable.  Skin: Warm and dry, no rashes. Cardiac: Regular rate and rhythm, no murmurs rubs or gallops, no lower extremity edema.  Respiratory: Clear to auscultation bilaterally. Not using accessory muscles, speaking in full sentences. Back Exam:  Inspection: Unremarkable  Motion: Flexion 45 deg, Extension 45 deg, Side Bending to 45 deg bilaterally,  Rotation to 45 deg bilaterally  SLR laying: Negative  XSLR laying: Negative  Palpable tenderness: None. FABER: negative. Sensory change: Gross sensation intact to all lumbar and sacral dermatomes.  Reflexes: 2+ at both patellar tendons, 2+ at achilles tendons, Babinski's downgoing.  Strength at foot  Plantar-flexion: 5/5 Dorsi-flexion: 4-/5 Eversion: 5/5 Inversion: 5/5  Leg strength  Quad: 5/5 Hamstring: 5/5 Hip flexor: 5/5 Hip abductors: 5/5  Gait unremarkable.  Impression and Recommendations:    I spent 40  minutes with this patient, greater than 50% was face-to-face time counseling regarding the above diagnoses

## 2016-01-15 NOTE — Assessment & Plan Note (Addendum)
Progressive lower extremity weakness to dorsiflexion of the ankle suggestive of bilateral foot drop. Combined with back pain without constitutional symptoms we do need to obtain an urgent MRI of the lumbar spine without contrast. Adding prednisone, meloxicam, Flexeril. X-rays will be done downstairs today.  MRI shows severe spinal stenosis at L3-L4 and L4-L5, considering bilateral dorsiflexion weakness of both ankles she does need urgent operative intervention. This was discussed with Dr. Christella Noa with spine surgery, we looked at her images together and he will probably take her to the operating room on Thursday. Official referral placed.

## 2016-01-15 NOTE — Telephone Encounter (Signed)
Its finished and in my box, they haven't filled out their portion.

## 2016-01-16 ENCOUNTER — Ambulatory Visit: Payer: BLUE CROSS/BLUE SHIELD | Admitting: Family Medicine

## 2016-01-17 ENCOUNTER — Ambulatory Visit (INDEPENDENT_AMBULATORY_CARE_PROVIDER_SITE_OTHER): Payer: BLUE CROSS/BLUE SHIELD | Admitting: Sports Medicine

## 2016-01-17 ENCOUNTER — Other Ambulatory Visit: Payer: BLUE CROSS/BLUE SHIELD

## 2016-01-17 DIAGNOSIS — M6281 Muscle weakness (generalized): Secondary | ICD-10-CM | POA: Diagnosis not present

## 2016-01-17 DIAGNOSIS — R531 Weakness: Secondary | ICD-10-CM

## 2016-01-17 MED ORDER — PAROXETINE HCL 20 MG PO TABS: 20.0000 mg | ORAL_TABLET | Freq: Every day | ORAL | Status: DC

## 2016-01-17 NOTE — Progress Notes (Signed)
  Subjective:    CC:  MRI follow-up  HPI: This is a pleasant 45  Year old female, she returns to go over her MRI,  She has progressive weakness in her legs with difficulty walking, predominantly dorsiflexion weakness. MRI reults be dictated below, I have ordered discussed this with neurosurgery who plans to take her to the operating room today for a 2 level decompression. Continues to deny any bowel or bladder dysfunction or saddle numbness.  Past medical history, Surgical history, Family history not pertinant except as noted below, Social history, Allergies, and medications have been entered into the medical record, reviewed, and no changes needed.   Review of Systems: No fevers, chills, night sweats, weight loss, chest pain, or shortness of breath.   Objective:    General: Well Developed, well nourished, and in no acute distress.  Neuro: Alert and oriented x3, extra-ocular muscles intact, sensation grossly intact.  HEENT: Normocephalic, atraumatic, pupils equal round reactive to light, neck supple, no masses, no lymphadenopathy, thyroid nonpalpable.  Skin: Warm and dry, no rashes. Cardiac: Regular rate and rhythm, no murmurs rubs or gallops, no lower extremity edema.  Respiratory: Clear to auscultation bilaterally. Not using accessory muscles, speaking in full sentences.   MRI personally reviewed and shows severe central canal stenosis at L3-L4 and L4-L5.  Impression and Recommendations:

## 2016-01-17 NOTE — Assessment & Plan Note (Signed)
2 level central canal stenosis with progressive weakness. This was discussed with neurosurgery and they're going to proceed with a 2 level decompression. FMLA paperwork filled out today.

## 2016-01-18 ENCOUNTER — Encounter: Payer: Self-pay | Admitting: Neurology

## 2016-01-18 ENCOUNTER — Ambulatory Visit (INDEPENDENT_AMBULATORY_CARE_PROVIDER_SITE_OTHER): Payer: BLUE CROSS/BLUE SHIELD | Admitting: Neurology

## 2016-01-18 VITALS — BP 142/89 | HR 80 | Ht 67.0 in | Wt 198.0 lb

## 2016-01-18 DIAGNOSIS — M4806 Spinal stenosis, lumbar region: Secondary | ICD-10-CM

## 2016-01-18 DIAGNOSIS — M48061 Spinal stenosis, lumbar region without neurogenic claudication: Secondary | ICD-10-CM | POA: Insufficient documentation

## 2016-01-18 DIAGNOSIS — R29898 Other symptoms and signs involving the musculoskeletal system: Secondary | ICD-10-CM

## 2016-01-18 HISTORY — DX: Spinal stenosis, lumbar region without neurogenic claudication: M48.061

## 2016-01-18 MED ORDER — ALPRAZOLAM 0.5 MG PO TABS: ORAL_TABLET | ORAL | Status: DC

## 2016-01-18 NOTE — Progress Notes (Signed)
Reason for visit: Leg weakness  Referring physician: Dr. Cherre Huger Ardoin is a 45 y.o. female  History of present illness:  [name] is a 45 year old white female with a history of cervical spine surgery done previously in [place]. The patient indicated that this was done in 2007 at the C4-5 level, and that there was spinal cord compression prior to surgery. The patient has done well following this, she has bariatric surgery for weight loss, she is on B12 supplementation and multivitamins. Around 12/26/2015, she developed some back pain across the low back, and began noting some weakness of the lower extremities. She had already planned on going on a cruise which she went ahead and did, but the trip down to [place] worsened the back pain and leg weakness. While on the cruise, she visited several beaches, she noted that she had difficulty getting out of the ocean, and she fell on a pier and required assistance standing up. The patient has not noted any difficulty with control of the bowels or the bladder. She does have some discomfort down the back of the legs at times, the pain is somewhat more severe with walking. The patient has undergone MRI evaluation of the lumbar spine shows fairly severe spinal stenosis at the L3-4 and L4-5 levels. The patient was seen by Dr. Cyndy Freeze, and she is referred to this office for evaluation. There is concern that the onset of the weakness is rather rapid, and may not be fully related to the spinal stenosis. The patient also has hyperreflexia the knees, and in the upper extremities as well. The patient denies any significant numbness with exception she has some bilateral hand numbness following the cervical spine surgery. Occasionally, she may have some tingling into the left leg. She has been on prednisone without much benefit with the leg weakness.  Past Medical History  Diagnosis Date  . Hypertension   . Diabetes mellitus without complication  ([place])   . Spinal stenosis of lumbar region 01/18/2016    Past Surgical History  Procedure Laterality Date  . Gastric bypass  08/2011  . Cervical laminectomy    . Breast reduction surgery      Family History  Problem Relation Age of Onset  . Diabetes Mother   . Hypertension Father   . Diabetes Brother   . Diabetes Maternal Grandmother     Social history:  reports that she quit smoking about 10 years ago. She does not have any smokeless tobacco history on file. Her alcohol and drug histories are not on file.  Medications:  Prior to Admission medications   Medication Sig Start Date End Date Taking? Authorizing Provider  albuterol (PROVENTIL HFA;VENTOLIN HFA) 108 (90 BASE) MCG/ACT inhaler Inhale into the lungs every 6 (six) hours as needed for wheezing or shortness of breath.   Yes Historical Provider, MD  cyclobenzaprine (FLEXERIL) 10 MG tablet One half tab PO qHS, then increase gradually to one tab TID. 01/15/16  Yes Silverio Decamp, MD  diazepam (VALIUM) 5 MG tablet Take 1 tab PO 1 hour before procedure or imaging. 01/15/16  Yes Silverio Decamp, MD  gabapentin (NEURONTIN) 300 MG capsule One tab PO qHS for a week, then BID for a week, then TID. May double weekly to a max of 3,600mg /day 05/28/15  Yes Silverio Decamp, MD  levonorgestrel-ethinyl estradiol (JOLESSA) 0.15-0.03 MG tablet  11/06/14  Yes Historical Provider, MD  loratadine (CLARITIN) 10 MG tablet Take 10 mg by mouth daily.  Yes Historical Provider, MD  losartan (COZAAR) 100 MG tablet Take 1 tablet (100 mg total) by mouth daily. 04/30/15  Yes Silverio Decamp, MD  meloxicam (MOBIC) 15 MG tablet One tab PO qAM with breakfast for 2 weeks, then daily prn pain. 01/15/16  Yes Silverio Decamp, MD  Multiple Vitamin (MULTIVITAMIN) tablet Take 1 tablet by mouth daily.   Yes Historical Provider, MD  PARoxetine (PAXIL) 20 MG tablet Take 1 tablet (20 mg total) by mouth daily. 01/17/16  Yes Silverio Decamp, MD    predniSONE (DELTASONE) 50 MG tablet One tab PO daily for 5 days. 01/15/16  Yes Silverio Decamp, MD     No Known Allergies  ROS:  Out of a complete 14 system review of symptoms, the patient complains only of the following symptoms, and all other reviewed systems are negative.  Back pain, walking difficulty Leg weakness  Blood pressure 142/89, pulse 80, height 5\' 7"  (1.702 m), weight 198 lb (89.812 kg).  Physical Exam  General: The patient is alert and cooperative at the time of the examination.  Eyes: Pupils are equal, round, and reactive to light. Discs are flat bilaterally.  Neck: The neck is supple, no carotid bruits are noted.  Respiratory: The respiratory examination is clear.  Cardiovascular: The cardiovascular examination reveals a regular rate and rhythm, no obvious murmurs or rubs are noted.  Skin: Extremities are with 1+ edema below the knees.  Neurologic Exam  Mental status: The patient is alert and oriented x 3 at the time of the examination. The patient has apparent normal recent and remote memory, with an apparently normal attention span and concentration ability.  Cranial nerves: Facial symmetry is present. There is good sensation of the face to pinprick and soft touch bilaterally. The strength of the facial muscles and the muscles to head turning and shoulder shrug are normal bilaterally. Speech is well enunciated, no aphasia or dysarthria is noted. Extraocular movements are full. Visual fields are full. The tongue is midline, and the patient has symmetric elevation of the soft palate. No obvious hearing deficits are noted.  Motor: The motor testing reveals 5 over 5 strength of the upper extremities. With the lower extremities, there is slight weakness with hip flexion bilaterally, the patient has bilateral foot drops, and has plantar flexion weakness on the left, not on the right. There is weakness with eversion greater than inversion of the feet bilaterally.  Good symmetric motor tone is noted throughout.  Sensory: Sensory testing is intact to pinprick, soft touch, vibration sensation, and position sense on the upper extremities. With the lower extremity, there is some decrease in pinprick sensation of the left foot is compared to the right, no stocking pattern pinprick sensory deficit is noted. Vibration sensation is decreased on the left foot relative to the right, minimal depression of position sensation is seen in both feet. No evidence of extinction is noted.  Coordination: Cerebellar testing reveals good finger-nose-finger and heel-to-shin bilaterally.  Gait and station: Gait is slightly wide-based. Tandem gait is slightly unsteady. Romberg is negative. No drift is seen.  Reflexes: Deep tendon reflexes are notable for increased reflexes on the triceps reflexes bilaterally, the left biceps reflex is brisk as compared to the right. The knee jerk reflexes are brisk bilaterally, the ankle jerk reflexes are depressed bilaterally. Toes are downgoing bilaterally.   MRI lumbar 01/15/16:  IMPRESSION: Moderately severe to severe congenital and acquired central canal stenosis and right worse than left lateral recess narrowing at  L4-5 due to a disc bulge more prominent to the right and ligamentum flavum thickening.  Moderately severe congenital and acquired central canal and bilateral lateral recess stenosis L4-5 due to a disc bulge and ligamentum flavum thickening.  * MRI scan images were reviewed online. I agree with the written report.    Assessment/Plan:  1. Lumbosacral spinal stenosis, L3-4 and L4-5  2. History of cervical spine surgery  The patient will be sent for blood work today. She will undergo nerve conduction studies of both legs, EMG of the left leg. MRI of the cervical spine will be done given the hyperreflexia. The clinical examination does show some weakness proximally and distally in both legs, the left leg is more affected  than the right. The hyperreflexia seen in the arms and at the knees may be a residual from prior cervical spine surgery. If acute denervation is seen on EMG, this would suggest that the spinal stenosis is the etiology of the leg weakness. MRI of the cervical spine will be done to exclude the possibility of spinal cord compression, or evidence of demyelinating disease. These studies will be done soon as possible. The patient is claustrophobic, a prescription was given for alprazolam.  Jill Alexanders MD 01/18/2016 12:56 PM  [facility] [address] [place], [place] [address]  Phone [phone] Fax [phone]

## 2016-01-22 LAB — VITAMIN B12: Vitamin B-12: 649 pg/mL (ref 211–946)

## 2016-01-22 LAB — RPR: RPR Ser Ql: NONREACTIVE

## 2016-01-22 LAB — CK: Total CK: 80 U/L (ref 24–173)

## 2016-01-22 LAB — TSH: TSH: 0.919 u[IU]/mL (ref 0.450–4.500)

## 2016-01-22 LAB — ANA W/REFLEX: Anti Nuclear Antibody(ANA): NEGATIVE

## 2016-01-22 LAB — SEDIMENTATION RATE: Sed Rate: 8 mm/hr (ref 0–32)

## 2016-01-22 LAB — HIV ANTIBODY (ROUTINE TESTING W REFLEX): HIV Screen 4th Generation wRfx: NONREACTIVE

## 2016-01-22 LAB — METHYLMALONIC ACID, SERUM: Methylmalonic Acid: 158 nmol/L (ref 0–378)

## 2016-01-23 ENCOUNTER — Ambulatory Visit (INDEPENDENT_AMBULATORY_CARE_PROVIDER_SITE_OTHER): Payer: BLUE CROSS/BLUE SHIELD

## 2016-01-23 ENCOUNTER — Telehealth: Payer: Self-pay | Admitting: *Deleted

## 2016-01-23 DIAGNOSIS — R29898 Other symptoms and signs involving the musculoskeletal system: Secondary | ICD-10-CM

## 2016-01-23 DIAGNOSIS — M4806 Spinal stenosis, lumbar region: Secondary | ICD-10-CM | POA: Diagnosis not present

## 2016-01-23 DIAGNOSIS — M48061 Spinal stenosis, lumbar region without neurogenic claudication: Secondary | ICD-10-CM

## 2016-01-23 NOTE — Telephone Encounter (Signed)
Spoke to pt gave lab results.  She verbalized understanding

## 2016-01-23 NOTE — Telephone Encounter (Signed)
-----   Message from Kathrynn Ducking, MD sent at 01/22/2016  4:38 PM EDT -----  The blood work results are unremarkable. Please call the patient.  ----- Message -----    From: [facility] Lab Results In Interface    Sent: 01/19/2016   7:42 AM      To: Kathrynn Ducking, MD

## 2016-01-25 ENCOUNTER — Ambulatory Visit (INDEPENDENT_AMBULATORY_CARE_PROVIDER_SITE_OTHER): Payer: BLUE CROSS/BLUE SHIELD | Admitting: Neurology

## 2016-01-25 ENCOUNTER — Ambulatory Visit (INDEPENDENT_AMBULATORY_CARE_PROVIDER_SITE_OTHER): Payer: Self-pay | Admitting: Neurology

## 2016-01-25 ENCOUNTER — Encounter: Payer: Self-pay | Admitting: Neurology

## 2016-01-25 DIAGNOSIS — R29898 Other symptoms and signs involving the musculoskeletal system: Secondary | ICD-10-CM

## 2016-01-25 DIAGNOSIS — M4806 Spinal stenosis, lumbar region: Secondary | ICD-10-CM

## 2016-01-25 DIAGNOSIS — M48061 Spinal stenosis, lumbar region without neurogenic claudication: Secondary | ICD-10-CM

## 2016-01-25 NOTE — Progress Notes (Signed)
Denise Gay is a 45 year old patient with a history of subacute onset bilateral leg weakness. The patient comes in today for EMG and nerve conduction study that shows evidence of diffuse denervation of the left leg. This suggests that the lumbosacral spinal stenosis is a contributing factor to the leg weakness.  MRI of the cervical spine shows two-level central disc bulges with spinal stenosis that is significant at the C3-4 and C4-5 levels with flattening the spinal cord, I do not have the formal report of the MRI, but I do not see any cord signal in the cervical area. This issue will likely need to be managed in the future.  I have called the office of Dr. Christella Noa and left him my cell phone number for him to call me back regarding the above findings.

## 2016-01-25 NOTE — Procedures (Signed)
     HISTORY:  Denise Gay is a 45 year old patient with a history of prior cervical spine surgery who has been found to have significant lumbosacral spinal stenosis at the L3-4 and L4-5 levels. The patient has had a subacute onset of leg weakness and difficulty with walking. The patient is being evaluated for this leg weakness.  NERVE CONDUCTION STUDIES:  Nerve conduction studies were performed on both lower extremities. The distal motor latencies and motor amplitudes for the peroneal and posterior tibial nerves were within normal limits. The nerve conduction velocities for these nerves were also normal. The H reflex latencies were normal. The sensory latencies for the peroneal nerves were within normal limits.   EMG STUDIES:  EMG study was performed on the left lower extremity:  The tibialis anterior muscle reveals 2 to 4K motor units with full recruitment. 2+ fibrillations and positive waves were seen. The peroneus tertius muscle reveals 2 to 4K motor units with full recruitment. 2+ fibrillations and positive waves were seen. The medial gastrocnemius muscle reveals 1 to 3K motor units with full recruitment. 1+ fibrillations and positive waves were seen. The vastus lateralis muscle reveals 2 to 4K motor units with full recruitment. 1+ fibrillations and positive waves were seen. The iliopsoas muscle reveals 2 to 4K motor units with full recruitment. 1+ fibrillations and positive waves were seen. The biceps femoris muscle (long head) reveals 2 to 4K motor units with full recruitment. 2+ fibrillations and positive waves were seen. The lumbosacral paraspinal muscles were tested at 3 levels, and revealed 2+ positive waves at all 3 levels tested. There was good relaxation.   IMPRESSION:  Nerve conduction studies done on both lower extremities were unremarkable, no evidence of a peripheral neuropathy is seen. EMG evaluation of the left lower extremity shows diffuse acute denervation involving  multiple nerve roots from at least the L3 level through the S1 level. This study would suggest that the lumbosacral spinal stenosis is a significant contributing factor with the leg weakness.  Jill Alexanders MD 01/25/2016 10:50 AM   [facility] [address] [place], [place] [address]  Phone [phone] Fax [phone]

## 2016-01-25 NOTE — Progress Notes (Signed)
Please refer to EMG and nerve conduction study procedure note. 

## 2016-01-28 ENCOUNTER — Other Ambulatory Visit: Payer: Self-pay | Admitting: Sports Medicine

## 2016-02-04 ENCOUNTER — Ambulatory Visit: Payer: BLUE CROSS/BLUE SHIELD | Admitting: Sports Medicine

## 2016-02-21 ENCOUNTER — Telehealth: Payer: Self-pay | Admitting: Emergency Medicine

## 2016-02-21 ENCOUNTER — Encounter: Payer: Self-pay | Admitting: Emergency Medicine

## 2016-02-21 ENCOUNTER — Emergency Department
Admission: EM | Admit: 2016-02-21 | Discharge: 2016-02-21 | Disposition: A | Discharge status: Home / Self Care | Payer: BLUE CROSS/BLUE SHIELD | Source: Home / Self Care | Attending: Family Medicine | Admitting: Family Medicine

## 2016-02-21 DIAGNOSIS — L03312 Cellulitis of back [any part except buttock]: Secondary | ICD-10-CM

## 2016-02-21 DIAGNOSIS — L7682 Other postprocedural complications of skin and subcutaneous tissue: Secondary | ICD-10-CM

## 2016-02-21 DIAGNOSIS — R208 Other disturbances of skin sensation: Secondary | ICD-10-CM

## 2016-02-21 MED ORDER — DOXYCYCLINE HYCLATE 100 MG PO CAPS: 100.0000 mg | ORAL_CAPSULE | Freq: Two times a day (BID) | ORAL | Status: DC

## 2016-02-21 NOTE — ED Notes (Signed)
Incisional pain, post-op 2 weeks, concerned that it's infected, slight drainage of blood for a couple of days, she has been laying on her back.

## 2016-02-21 NOTE — ED Provider Notes (Signed)
CSN: [id number]     Arrival date & time 02/21/16  1603 History   First MD Initiated Contact with Patient 02/21/16 1647     Chief Complaint  Patient presents with  . Incisional Pain      HPI Comments: Patient underwent lumbar disc surgery two weeks ago.  She has been recovering uneventfully except for development of small amounts of bloody drainage from her incision over the past several days.  She is concerned that she may be developing an early wound infection.  She feels well otherwise.  No fevers, chills, and sweats.  The history is provided by the patient and the spouse.    Past Medical History  Diagnosis Date  . Hypertension   . Diabetes mellitus without complication ([place])   . Spinal stenosis of lumbar region 01/18/2016   Past Surgical History  Procedure Laterality Date  . Gastric bypass  08/2011  . Cervical laminectomy    . Breast reduction surgery     Family History  Problem Relation Age of Onset  . Diabetes Mother   . Hypertension Father   . Diabetes Brother   . Diabetes Maternal Grandmother    Social History  Substance Use Topics  . Smoking status: Former Smoker    Quit date: 12/18/2005  . Smokeless tobacco: None  . Alcohol Use: None   OB History    No data available     Review of Systems  Constitutional: Negative for fever, chills, diaphoresis, activity change, appetite change and fatigue.  HENT: Negative.   Eyes: Negative.   Respiratory: Negative.   Cardiovascular: Negative.   Gastrointestinal: Negative.   Genitourinary: Negative.   Musculoskeletal: Negative.   Skin:       Lumbar incisional wound drainage  Neurological: Negative for headaches.    Allergies  Review of patient's allergies indicates no known allergies.  Home Medications   Prior to Admission medications   Medication Sig Start Date End Date Taking? Authorizing Provider  oxyCODONE-acetaminophen (PERCOCET/ROXICET) 5-325 MG tablet Take by mouth every 6 (six) hours as needed for severe  pain.   Yes Historical Provider, MD  albuterol (PROVENTIL HFA;VENTOLIN HFA) 108 (90 BASE) MCG/ACT inhaler Inhale into the lungs every 6 (six) hours as needed for wheezing or shortness of breath.    Historical Provider, MD  ALPRAZolam Duanne Moron) 0.5 MG tablet Take 2 tablets approximately 45 minutes prior to the MRI study, take a third tablet if needed. 01/18/16   Kathrynn Ducking, MD  cyclobenzaprine (FLEXERIL) 10 MG tablet One half tab PO qHS, then increase gradually to one tab TID. 01/15/16   Silverio Decamp, MD  diazepam (VALIUM) 5 MG tablet Take 1 tab PO 1 hour before procedure or imaging. 01/15/16   Silverio Decamp, MD  doxycycline (VIBRAMYCIN) 100 MG capsule Take 1 capsule (100 mg total) by mouth 2 (two) times daily. Take with food. 02/21/16   Kandra Nicolas, MD  gabapentin (NEURONTIN) 300 MG capsule One tab PO qHS for a week, then BID for a week, then TID. May double weekly to a max of 3,600mg /day 05/28/15   Silverio Decamp, MD  levonorgestrel-ethinyl estradiol (JOLESSA) 0.15-0.03 MG tablet  11/06/14   Historical Provider, MD  loratadine (CLARITIN) 10 MG tablet Take 10 mg by mouth daily.    Historical Provider, MD  losartan (COZAAR) 100 MG tablet Take 1 tablet (100 mg total) by mouth daily. 04/30/15   Silverio Decamp, MD  meloxicam (MOBIC) 15 MG tablet One tab PO qAM with  breakfast for 2 weeks, then daily prn pain. 01/15/16   Silverio Decamp, MD  Multiple Vitamin (MULTIVITAMIN) tablet Take 1 tablet by mouth daily.    Historical Provider, MD  PARoxetine (PAXIL) 20 MG tablet Take 1 tablet (20 mg total) by mouth daily. 01/17/16   Silverio Decamp, MD  predniSONE (DELTASONE) 50 MG tablet One tab PO daily for 5 days. 01/15/16   Silverio Decamp, MD   Meds Ordered and Administered this Visit  Medications - No data to display  BP 120/82 mmHg  Pulse 89  Temp(Src) 98.4 F (36.9 C) (Oral)  Ht 5\' 7"  (1.702 m)  Wt 198 lb (89.812 kg)  BMI 31.00 kg/m2  SpO2 97% No data  found.   Physical Exam  Constitutional: She is oriented to person, place, and time. She appears well-developed and well-nourished.  HENT:  Head: Normocephalic.  Eyes: Pupils are equal, round, and reactive to light.  Neck: Neck supple.  Cardiovascular: Normal heart sounds.   Pulmonary/Chest: Breath sounds normal.  Abdominal: There is no tenderness.  Musculoskeletal: She exhibits no edema.  Neurological: She is alert and oriented to person, place, and time.  Skin: Skin is warm and dry.     Midline lumbosacral surgical incision appears to be healing well without surrounding erythema, swelling, or tenderness.  There is a small amount of crusted discharge from the wound centrally.  No dehiscence.  Nursing note and vitals reviewed.   ED Course  Procedures  None    Labs Reviewed  WOUND CULTURE      MDM   1. Incisional pain   2. Cellulitis of back except buttock    Wound culture pending.  Begin doxycycline 100mg  BID for staph coverage. Change bandage daily.  Keep wound clean and dry. If symptoms become significantly worse during the night or over the weekend, proceed to the local emergency room.  Followup with neurosurgeon as scheduled.    Kandra Nicolas, MD 02/27/16 2145

## 2016-02-21 NOTE — Discharge Instructions (Signed)
Change bandage daily.  Keep wound clean and dry. If symptoms become significantly worse during the night or over the weekend, proceed to the local emergency room.    Cellulitis Cellulitis is an infection of the skin and the tissue beneath it. The infected area is usually red and tender. Cellulitis occurs most often in the arms and lower legs.  CAUSES  Cellulitis is caused by bacteria that enter the skin through cracks or cuts in the skin. The most common types of bacteria that cause cellulitis are staphylococci and streptococci. SIGNS AND SYMPTOMS   Redness and warmth.  Swelling.  Tenderness or pain.  Fever. DIAGNOSIS  Your health care provider can usually determine what is wrong based on a physical exam. Blood tests may also be done. TREATMENT  Treatment usually involves taking an antibiotic medicine. HOME CARE INSTRUCTIONS   Take your antibiotic medicine as directed by your health care provider. Finish the antibiotic even if you start to feel better.  Keep the infected arm or leg elevated to reduce swelling.  Apply a warm cloth to the affected area up to 4 times per day to relieve pain.  Take medicines only as directed by your health care provider.  Keep all follow-up visits as directed by your health care provider. SEEK MEDICAL CARE IF:   You notice red streaks coming from the infected area.  Your red area gets larger or turns dark in color.  Your bone or joint underneath the infected area becomes painful after the skin has healed.  Your infection returns in the same area or another area.  You notice a swollen bump in the infected area.  You develop new symptoms.  You have a fever. SEEK IMMEDIATE MEDICAL CARE IF:   You feel very sleepy.  You develop vomiting or diarrhea.  You have a general ill feeling (malaise) with muscle aches and pains.   This information is not intended to replace advice given to you by your health care provider. Make sure you discuss  any questions you have with your health care provider.   Document Released: 07/16/2005 Document Revised: 06/27/2015 Document Reviewed: 12/22/2011 Elsevier Interactive Patient Education [employer].

## 2016-02-23 ENCOUNTER — Inpatient Hospital Stay (HOSPITAL_COMMUNITY): Admission: RE | Admit: 2016-02-23 | Payer: BLUE CROSS/BLUE SHIELD | Source: Ambulatory Visit | Admitting: Neurosurgery

## 2016-02-24 LAB — WOUND CULTURE: Gram Stain: NONE SEEN

## 2016-02-28 ENCOUNTER — Other Ambulatory Visit: Payer: Self-pay | Admitting: Sports Medicine

## 2016-02-28 DIAGNOSIS — R531 Weakness: Secondary | ICD-10-CM

## 2016-02-28 MED ORDER — CYCLOBENZAPRINE HCL 10 MG PO TABS: ORAL_TABLET | ORAL | Status: DC

## 2016-03-07 ENCOUNTER — Encounter: Payer: Self-pay | Admitting: Sports Medicine

## 2016-03-10 ENCOUNTER — Encounter: Payer: Self-pay | Admitting: Sports Medicine

## 2016-03-10 ENCOUNTER — Ambulatory Visit (INDEPENDENT_AMBULATORY_CARE_PROVIDER_SITE_OTHER): Payer: BLUE CROSS/BLUE SHIELD | Admitting: Sports Medicine

## 2016-03-10 VITALS — BP 117/77 | HR 90 | Resp 16 | Wt 204.6 lb

## 2016-03-10 DIAGNOSIS — Z9889 Other specified postprocedural states: Secondary | ICD-10-CM | POA: Diagnosis not present

## 2016-03-10 DIAGNOSIS — E669 Obesity, unspecified: Secondary | ICD-10-CM | POA: Diagnosis not present

## 2016-03-10 MED ORDER — OXYCODONE-ACETAMINOPHEN 5-325 MG PO TABS: 1.0000 | ORAL_TABLET | Freq: Three times a day (TID) | ORAL | Status: DC | PRN

## 2016-03-10 MED ORDER — PHENTERMINE HCL 37.5 MG PO TABS: ORAL_TABLET | ORAL | Status: DC

## 2016-03-10 NOTE — Assessment & Plan Note (Signed)
Starting phentermine, return monthly for weight checks and refills. 

## 2016-03-10 NOTE — Progress Notes (Signed)
  Subjective:    CC: Neck pain  HPI: Denise Gay is a pleasant 45 year old female, she has a history of a C5-C6 fusion, more recently she's had pain that she localizes in her neck with radiation down the right arm, moderate, persistent. Denies any weakness in her arms, overall she's done well after her lumbar decompression for severe spinal stenosis. She did have an MRI of her cervical spine that showed multilevel adjacent level cervical spinal stenosis without myelomalacia.  Obesity: Post Roux-en-Y gastric bypass, desires additional help losing weight, her surgery was years ago.  Past medical history, Surgical history, Family history not pertinant except as noted below, Social history, Allergies, and medications have been entered into the medical record, reviewed, and no changes needed.   Review of Systems: No fevers, chills, night sweats, weight loss, chest pain, or shortness of breath.   Objective:    General: Well Developed, well nourished, and in no acute distress.  Neuro: Alert and oriented x3, extra-ocular muscles intact, sensation grossly intact.  HEENT: Normocephalic, atraumatic, pupils equal round reactive to light, neck supple, no masses, no lymphadenopathy, thyroid nonpalpable.  Skin: Warm and dry, no rashes. Cardiac: Regular rate and rhythm, no murmurs rubs or gallops, no lower extremity edema.  Respiratory: Clear to auscultation bilaterally. Not using accessory muscles, speaking in full sentences. Neck: Negative spurling's Full neck range of motion Strength is somewhat weak to elbow flexion, reflexes are 1+ at the biceps. Strength good C4 to T1 distribution No sensory change to C4 to T1 Reflexes normal  Impression and Recommendations:    I spent 40 minutes with this patient, greater than 50% was face-to-face time counseling regarding the above diagnoses

## 2016-03-10 NOTE — Assessment & Plan Note (Signed)
MRI shows severe multilevel stenosis, adjacent to the prior fusion. Proceeding with cervical epidural, I'm going to give her a bit of Percocet with the understanding that no more prescriptions will be given. Return to see me in one month, if no better we will refer her to Dr. Lynann Bologna.

## 2016-03-11 ENCOUNTER — Ambulatory Visit (HOSPITAL_COMMUNITY)
Admission: RE | Admit: 2016-03-11 | Payer: BLUE CROSS/BLUE SHIELD | Source: Ambulatory Visit | Admitting: Obstetrics and Gynecology

## 2016-03-11 ENCOUNTER — Encounter (HOSPITAL_COMMUNITY): Admission: RE | Payer: Self-pay | Source: Ambulatory Visit

## 2016-03-11 SURGERY — HYSTERECTOMY, VAGINAL, LAPAROSCOPY-ASSISTED, WITH SALPINGECTOMY
Anesthesia: General | Laterality: Bilateral

## 2016-03-19 ENCOUNTER — Telehealth: Payer: Self-pay

## 2016-03-19 ENCOUNTER — Other Ambulatory Visit: Payer: Self-pay | Admitting: Sports Medicine

## 2016-03-19 DIAGNOSIS — R531 Weakness: Secondary | ICD-10-CM

## 2016-03-19 NOTE — Telephone Encounter (Signed)
Received faxed request for additional medical records from [facility] for STD benefits, along w/ authorization to release of info signed by pt. Last OV notes as well as MRI and NCV results faxed to [phone].

## 2016-03-20 ENCOUNTER — Ambulatory Visit
Admission: RE | Admit: 2016-03-20 | Discharge: 2016-03-20 | Disposition: A | Discharge status: Home / Self Care | Payer: BLUE CROSS/BLUE SHIELD | Source: Ambulatory Visit | Attending: Sports Medicine | Admitting: Sports Medicine

## 2016-03-20 MED ORDER — IOPAMIDOL (ISOVUE-300) INJECTION 61%
1.0000 mL | Freq: Once | INTRAVENOUS | Status: AC | PRN
  Administered 2016-03-20: 1 mL

## 2016-03-20 MED ORDER — CYCLOBENZAPRINE HCL 10 MG PO TABS: ORAL_TABLET | ORAL | Status: DC

## 2016-03-20 MED ORDER — METHYLPREDNISOLONE ACETATE 40 MG/ML INJ SUSP (RADIOLOG
120.0000 mg | Freq: Once | INTRAMUSCULAR | Status: AC
  Administered 2016-03-20: 120 mg via EPIDURAL

## 2016-03-20 NOTE — Discharge Instructions (Signed)

## 2016-03-20 NOTE — Addendum Note (Signed)
Addended by: Silverio Decamp on: 03/20/2016 10:33 AM   Modules accepted: Orders

## 2016-03-26 ENCOUNTER — Other Ambulatory Visit: Payer: Self-pay | Admitting: Sports Medicine

## 2016-03-26 DIAGNOSIS — E669 Obesity, unspecified: Secondary | ICD-10-CM

## 2016-04-01 ENCOUNTER — Telehealth: Payer: Self-pay | Admitting: Sports Medicine

## 2016-04-01 DIAGNOSIS — E669 Obesity, unspecified: Secondary | ICD-10-CM

## 2016-04-01 MED ORDER — IBUPROFEN 800 MG PO TABS: 800.0000 mg | ORAL_TABLET | Freq: Three times a day (TID) | ORAL | Status: DC | PRN

## 2016-04-01 NOTE — Telephone Encounter (Signed)
Routed to PCP for review

## 2016-04-01 NOTE — Telephone Encounter (Signed)
Ibuprofen sent in, appointment needed for weight checks and phentermine refills

## 2016-04-17 ENCOUNTER — Ambulatory Visit: Payer: BLUE CROSS/BLUE SHIELD | Admitting: Sports Medicine

## 2016-04-18 ENCOUNTER — Ambulatory Visit: Payer: BLUE CROSS/BLUE SHIELD | Admitting: Sports Medicine

## 2016-04-24 ENCOUNTER — Encounter: Payer: Self-pay | Admitting: Sports Medicine

## 2016-04-24 ENCOUNTER — Telehealth: Payer: Self-pay | Admitting: Sports Medicine

## 2016-04-24 DIAGNOSIS — Z9889 Other specified postprocedural states: Secondary | ICD-10-CM

## 2016-04-28 ENCOUNTER — Ambulatory Visit (INDEPENDENT_AMBULATORY_CARE_PROVIDER_SITE_OTHER): Payer: BLUE CROSS/BLUE SHIELD | Admitting: Sports Medicine

## 2016-04-28 ENCOUNTER — Other Ambulatory Visit: Payer: Self-pay | Admitting: Sports Medicine

## 2016-04-28 ENCOUNTER — Encounter: Payer: Self-pay | Admitting: Sports Medicine

## 2016-04-28 DIAGNOSIS — Z9889 Other specified postprocedural states: Secondary | ICD-10-CM

## 2016-04-28 DIAGNOSIS — M48061 Spinal stenosis, lumbar region without neurogenic claudication: Secondary | ICD-10-CM

## 2016-04-28 DIAGNOSIS — M4806 Spinal stenosis, lumbar region: Secondary | ICD-10-CM | POA: Diagnosis not present

## 2016-04-28 NOTE — Progress Notes (Signed)
Patient ID: Denise Gay, female   DOB: 11-17-1970, 45 y.o.   MRN: [id number]   Subjective:    CC: R arm weakness and L leg numbness  HPI: Presents one month s/p cervical epidural, which provided relief for 2 weeks but pain has now recurred.  Complains of R arm and neck weakness and pain.  Also complains of L leg numbness for past three weeks.  Pain is worse at night.  Pain does not pass the knee.  Prior MRIs show L4-L5 stenosis and C3-4, C4-5, C6-7 stenosis.   Past medical history, Surgical history, Family history not pertinant except as noted below, Social history, Allergies, and medications have been entered into the medical record, reviewed, and no changes needed.   Review of Systems: No fevers, chills, night sweats, weight loss, chest pain, or shortness of breath.   Objective:    General: Well Developed, well nourished, and in no acute distress.  Neuro: Alert and oriented x3, extra-ocular muscles intact, sensation grossly intact.  HEENT: Normocephalic, atraumatic, pupils equal round reactive to light, neck supple, no masses, no lymphadenopathy, thyroid nonpalpable.  Skin: Warm and dry, no rashes. Cardiac: Regular rate and rhythm, no murmurs rubs or gallops, no lower extremity edema.  Respiratory: Clear to auscultation bilaterally. Not using accessory muscles, speaking in full sentences.   Impression and Recommendations:   Recurrent cervical radiculopathy and new onset lumbar radiculopathy.    MRI lumbar spine with and without IV contrast   Right C6-C7 epidural   Will need to f/u with neurosurgery

## 2016-04-28 NOTE — Assessment & Plan Note (Addendum)
With severe stenosis at levels above her ACDF. We are going to proceed with a right-sided cervical epidural but I would do think she ultimately will need to touch base again with neurosurgery. On further questioning she would prefer to use a different spine surgeon, we will have her discuss this with Dr. Phylliss Bob with [facility].

## 2016-04-28 NOTE — Telephone Encounter (Signed)
Routed to PCP for review

## 2016-04-28 NOTE — Assessment & Plan Note (Signed)
Status post multilevel decompression, for lumbar spinal stenosis. Unfortunately having a recurrence of left-sided radicular pain. Does not past the knee. There is also a degree of axial discogenic back pain, we are going to proceed with an MRI with IV contrast for further evaluation of new disc herniation in a postop patient.

## 2016-04-29 ENCOUNTER — Ambulatory Visit: Payer: BLUE CROSS/BLUE SHIELD | Admitting: Sports Medicine

## 2016-04-29 ENCOUNTER — Other Ambulatory Visit: Payer: Self-pay | Admitting: Sports Medicine

## 2016-04-29 ENCOUNTER — Encounter: Payer: Self-pay | Admitting: Sports Medicine

## 2016-04-29 DIAGNOSIS — Z9889 Other specified postprocedural states: Secondary | ICD-10-CM

## 2016-04-29 LAB — BASIC METABOLIC PANEL
BUN: 19 mg/dL (ref 7–25)
CO2: 20 mmol/L (ref 20–31)
Calcium: 9.5 mg/dL (ref 8.6–10.2)
Chloride: 102 mmol/L (ref 98–110)
Creat: 0.61 mg/dL (ref 0.50–1.10)
Glucose, Bld: 84 mg/dL (ref 65–99)
Potassium: 4.6 mmol/L (ref 3.5–5.3)
Sodium: 135 mmol/L (ref 135–146)

## 2016-04-29 NOTE — Addendum Note (Signed)
Addended by: Silverio Decamp on: 04/29/2016 10:10 AM   Modules accepted: Orders

## 2016-04-30 ENCOUNTER — Telehealth: Payer: Self-pay | Admitting: Sports Medicine

## 2016-04-30 ENCOUNTER — Encounter: Payer: Self-pay | Admitting: Sports Medicine

## 2016-04-30 DIAGNOSIS — M5136 Other intervertebral disc degeneration, lumbar region: Secondary | ICD-10-CM

## 2016-04-30 DIAGNOSIS — M51369 Other intervertebral disc degeneration, lumbar region without mention of lumbar back pain or lower extremity pain: Secondary | ICD-10-CM

## 2016-04-30 MED ORDER — DIAZEPAM 5 MG PO TABS: ORAL_TABLET | ORAL | Status: DC

## 2016-04-30 NOTE — Telephone Encounter (Signed)
Left information on Pt's VM. Rx faxed.

## 2016-04-30 NOTE — Telephone Encounter (Signed)
Rx'ed single valium.  Rx in box. (or maybe still on printer if you are too quick)

## 2016-04-30 NOTE — Telephone Encounter (Signed)
Pt would like pre-meds for MRI, states she is worried about being claustrophobic. Will route.

## 2016-04-30 NOTE — Telephone Encounter (Signed)
Leta would you please look into this again.  She needs this epidural asap.

## 2016-05-05 ENCOUNTER — Ambulatory Visit
Admission: RE | Admit: 2016-05-05 | Discharge: 2016-05-05 | Disposition: A | Discharge status: Home / Self Care | Payer: BLUE CROSS/BLUE SHIELD | Source: Ambulatory Visit | Attending: Sports Medicine | Admitting: Sports Medicine

## 2016-05-05 ENCOUNTER — Ambulatory Visit (INDEPENDENT_AMBULATORY_CARE_PROVIDER_SITE_OTHER): Payer: BLUE CROSS/BLUE SHIELD

## 2016-05-05 DIAGNOSIS — M8448XA Pathological fracture, other site, initial encounter for fracture: Secondary | ICD-10-CM

## 2016-05-05 DIAGNOSIS — G9619 Other disorders of meninges, not elsewhere classified: Secondary | ICD-10-CM | POA: Diagnosis not present

## 2016-05-05 DIAGNOSIS — M5126 Other intervertebral disc displacement, lumbar region: Secondary | ICD-10-CM

## 2016-05-05 MED ORDER — IOPAMIDOL (ISOVUE-M 300) INJECTION 61%
1.0000 mL | Freq: Once | INTRAMUSCULAR | Status: AC | PRN
  Administered 2016-05-05: 1 mL via EPIDURAL

## 2016-05-05 MED ORDER — TRIAMCINOLONE ACETONIDE 40 MG/ML IJ SUSP (RADIOLOGY)
60.0000 mg | Freq: Once | INTRAMUSCULAR | Status: AC
  Administered 2016-05-05: 60 mg via EPIDURAL

## 2016-05-05 MED ORDER — GADOBENATE DIMEGLUMINE 529 MG/ML IV SOLN
20.0000 mL | Freq: Once | INTRAVENOUS | Status: AC | PRN
  Administered 2016-05-05: 19 mL via INTRAVENOUS

## 2016-05-05 NOTE — Discharge Instructions (Signed)

## 2016-05-06 ENCOUNTER — Encounter: Payer: Self-pay | Admitting: Sports Medicine

## 2016-05-29 ENCOUNTER — Other Ambulatory Visit: Payer: Self-pay | Admitting: Sports Medicine

## 2016-05-29 DIAGNOSIS — Z9889 Other specified postprocedural states: Secondary | ICD-10-CM

## 2016-06-26 ENCOUNTER — Other Ambulatory Visit: Payer: Self-pay | Admitting: Sports Medicine

## 2016-06-26 DIAGNOSIS — Z9889 Other specified postprocedural states: Secondary | ICD-10-CM

## 2016-07-25 ENCOUNTER — Other Ambulatory Visit: Payer: Self-pay | Admitting: *Deleted

## 2016-07-25 MED ORDER — LOSARTAN POTASSIUM 100 MG PO TABS: 100.0000 mg | ORAL_TABLET | Freq: Every day | ORAL | 0 refills | Status: DC

## 2016-07-28 ENCOUNTER — Telehealth: Payer: Self-pay | Admitting: Sports Medicine

## 2016-07-28 NOTE — Telephone Encounter (Signed)
I called pt and left a voicemail stating she needs to call the office for an appointment for F/u on meds

## 2016-08-04 ENCOUNTER — Encounter: Payer: Self-pay | Admitting: Sports Medicine

## 2016-08-04 ENCOUNTER — Ambulatory Visit (INDEPENDENT_AMBULATORY_CARE_PROVIDER_SITE_OTHER): Payer: BLUE CROSS/BLUE SHIELD | Admitting: Sports Medicine

## 2016-08-04 DIAGNOSIS — M48061 Spinal stenosis, lumbar region without neurogenic claudication: Secondary | ICD-10-CM | POA: Diagnosis not present

## 2016-08-04 DIAGNOSIS — I10 Essential (primary) hypertension: Secondary | ICD-10-CM

## 2016-08-04 MED ORDER — PHENTERMINE HCL 37.5 MG PO TABS: ORAL_TABLET | ORAL | 0 refills | Status: DC

## 2016-08-04 MED ORDER — IBUPROFEN 800 MG PO TABS: 800.0000 mg | ORAL_TABLET | Freq: Three times a day (TID) | ORAL | 2 refills | Status: DC | PRN

## 2016-08-04 MED ORDER — LOSARTAN POTASSIUM 100 MG PO TABS: 100.0000 mg | ORAL_TABLET | Freq: Every day | ORAL | 3 refills | Status: DC

## 2016-08-04 NOTE — Assessment & Plan Note (Signed)
Starting phentermine, return in one month for a weight check and refills

## 2016-08-04 NOTE — Progress Notes (Signed)
  Subjective:    CC: medication refill  HPI: 45 yo F with history of HTN, C4-C7 ACDF with adjacent level disease, lumbar spinal stenosis presenting for medication refill.  She is hoping to have her losartan refilled today.  She would also like to get her handicap license renewed.  She recently started appointments at the pain clinic for cervical and lumbar pain- she is on Percocet three times a day.  This is helping with her pain.  She has trigger point injections scheduled in a couple of weeks.  She does want to discuss whether or not she is allowed to take Ibuprofen now that she is being seen at pain clinic.  She would also like to discuss weight loss medication vs. Gastric bypass revision now that she feels healthy enough to try losing weight again.   Past medical history:  Negative.  See flowsheet/record as well for more information.  Surgical history: Negative.  See flowsheet/record as well for more information.  Family history: Negative.  See flowsheet/record as well for more information.  Social history: Negative.  See flowsheet/record as well for more information.  Allergies, and medications have been entered into the medical record, reviewed, and no changes needed.   Review of Systems: No fevers, chills, night sweats, weight loss, chest pain, or shortness of breath.   Objective:    General: Well Developed, well nourished, and in no acute distress.  Neuro: Alert and oriented x3, extra-ocular muscles intact, sensation grossly intact.  HEENT: Normocephalic, atraumatic, pupils equal round reactive to light, neck supple, no masses, no lymphadenopathy, thyroid nonpalpable.  Skin: Warm and dry, no rashes. Cardiac: Regular rate and rhythm, no murmurs rubs or gallops, no lower extremity edema.  Respiratory: Clear to auscultation bilaterally. Not using accessory muscles, speaking in full sentences.   Impression and Recommendations:    1. Cervical and lumbar radiculopathy: Currently being  managed by pain clinic. -Continue recommendations per pain clinic.  Okay to take Ibuprofen -Handicap pass paperwork today   2. HTN -Refill losartan   3. Weight loss: Is interested in re-starting phentermine -One month prescription for phentermine  -Follow-up in 1 month for weight re-check

## 2016-08-04 NOTE — Assessment & Plan Note (Signed)
Is currently working with the pain clinic. Adding ibuprofen 800.

## 2016-08-04 NOTE — Assessment & Plan Note (Signed)
Refilling medication

## 2016-09-01 ENCOUNTER — Ambulatory Visit (INDEPENDENT_AMBULATORY_CARE_PROVIDER_SITE_OTHER): Payer: BLUE CROSS/BLUE SHIELD | Admitting: Sports Medicine

## 2016-09-01 ENCOUNTER — Ambulatory Visit: Payer: BLUE CROSS/BLUE SHIELD | Admitting: Sports Medicine

## 2016-09-01 ENCOUNTER — Encounter: Payer: Self-pay | Admitting: Sports Medicine

## 2016-09-01 DIAGNOSIS — E6609 Other obesity due to excess calories: Secondary | ICD-10-CM

## 2016-09-01 DIAGNOSIS — H6690 Otitis media, unspecified, unspecified ear: Secondary | ICD-10-CM | POA: Insufficient documentation

## 2016-09-01 DIAGNOSIS — H6693 Otitis media, unspecified, bilateral: Secondary | ICD-10-CM | POA: Diagnosis not present

## 2016-09-01 MED ORDER — AZITHROMYCIN 250 MG PO TABS: ORAL_TABLET | ORAL | 0 refills | Status: DC

## 2016-09-01 MED ORDER — NALTREXONE-BUPROPION HCL ER 8-90 MG PO TB12: ORAL_TABLET | ORAL | 0 refills | Status: DC

## 2016-09-01 NOTE — Assessment & Plan Note (Signed)
Patient was noncompliant with phentermine. Switching to Contrave.

## 2016-09-01 NOTE — Progress Notes (Signed)
  Subjective:    CC: Follow-up  HPI: Obesity: Was relatively noncompliant with phentermine. Agrees to switch to something else.  Ear pain: Right-sided, recent upper respiration symptoms, history of otitis media.  Past medical history:  Negative.  See flowsheet/record as well for more information.  Surgical history: Negative.  See flowsheet/record as well for more information.  Family history: Negative.  See flowsheet/record as well for more information.  Social history: Negative.  See flowsheet/record as well for more information.  Allergies, and medications have been entered into the medical record, reviewed, and no changes needed.   Review of Systems: No fevers, chills, night sweats, weight loss, chest pain, or shortness of breath.   Objective:    General: Well Developed, well nourished, and in no acute distress.  Neuro: Alert and oriented x3, extra-ocular muscles intact, sensation grossly intact.  HEENT: Normocephalic, atraumatic, pupils equal round reactive to light, neck supple, no masses, no lymphadenopathy, thyroid nonpalpable. Oropharynx, nasopharynx unremarkable, bilateral otitis media, right worse than left. Skin: Warm and dry, no rashes. Cardiac: Regular rate and rhythm, no murmurs rubs or gallops, no lower extremity edema.  Respiratory: Clear to auscultation bilaterally. Not using accessory muscles, speaking in full sentences.  Impression and Recommendations:    Obesity Patient was noncompliant with phentermine. Switching to Contrave.  Otitis media Azithromycin  I spent 25 minutes with this patient, greater than 50% was face-to-face time counseling regarding the above diagnoses

## 2016-09-01 NOTE — Assessment & Plan Note (Signed)
Azithromycin

## 2016-09-02 ENCOUNTER — Encounter: Payer: Self-pay | Admitting: Sports Medicine

## 2016-09-05 ENCOUNTER — Telehealth: Payer: Self-pay | Admitting: *Deleted

## 2016-09-05 MED ORDER — LORCASERIN HCL ER 20 MG PO TB24: 1.0000 | ORAL_TABLET | Freq: Every day | ORAL | 3 refills | Status: DC

## 2016-09-05 NOTE — Telephone Encounter (Signed)
PA submitted through covermymed for Contrave Key: [id number] - PA Case ID: [id number]

## 2016-09-05 NOTE — Telephone Encounter (Signed)
Left message on patient's vm and pharm vm that contrave approved. Approved from 08/06/2016-01/03/2017

## 2016-09-29 ENCOUNTER — Ambulatory Visit: Payer: BLUE CROSS/BLUE SHIELD | Admitting: Sports Medicine

## 2017-04-14 ENCOUNTER — Telehealth: Payer: Self-pay | Admitting: Sports Medicine

## 2017-04-14 NOTE — Telephone Encounter (Signed)
Just received the new MRI, previous ACDF looks good from C5-C7, unfortunately there is severe central canal stenosis at C3-C4 and C4-C5. There is myelomalacia at C4-C5 suggesting spinal cord compression, she needs to get in with Dr. Lynann Bologna on an urgent basis. I see that she has seen him already and he did recommend extension of the fusion, but I don't see that surgery has been performed.

## 2017-04-15 NOTE — Telephone Encounter (Signed)
Pt notified of results -EH/RMA   

## 2017-04-29 ENCOUNTER — Other Ambulatory Visit: Payer: Self-pay | Admitting: Orthopedic Surgery

## 2017-05-07 NOTE — Pre-Procedure Instructions (Signed)
Maraya Dasher  [birth year]      [facility], [address] [place] [address] Phone: [phone] Fax: [phone]  [id number] [place], [address] [place] [address] Phone: [phone] Fax: [phone]    Your procedure is scheduled on July 25  Report to [facility] at [place].M.  Call this number if you have problems the morning of surgery:  [phone]   Remember:  Do not eat food or drink liquids after midnight.   Take these medicines the morning of surgery with A SIP OF WATER oxyCODONE-acetaminophen (PERCOCET), PARoxetine (PAXIL),   7 days prior to surgery STOP taking any Aspirin, Aleve, Naproxen, Ibuprofen, Motrin, Advil, Goody's, BC's, all herbal medications, fish oil, and all vitamins     Do not wear jewelry, make-up or nail polish.  Do not wear lotions, powders, or perfumes, or deoderant.  Do not shave 48 hours prior to surgery.    Do not bring valuables to the hospital.  [facility] is not responsible for any belongings or valuables.  Contacts, dentures or bridgework may not be worn into surgery.  Leave your suitcase in the car.  After surgery it may be brought to your room.  For patients admitted to the hospital, discharge time will be determined by your treatment team.  Patients discharged the day of surgery will not be allowed to drive home.    Special instructions:   [facility]- Preparing For Surgery  Before surgery, you can play an important role. Because skin is not sterile, your skin needs to be as free of germs as possible. You can reduce the number of germs on your skin by washing with CHG (chlorahexidine gluconate) Soap before surgery.  CHG is an antiseptic cleaner which kills germs and bonds with the skin to continue killing germs even after washing.  Please do not use if you  have an allergy to CHG or antibacterial soaps. If your skin becomes reddened/irritated stop using the CHG.  Do not shave (including legs and underarms) for at least 48 hours prior to first CHG shower. It is OK to shave your face.  Please follow these instructions carefully.   1. Shower the NIGHT BEFORE SURGERY and the MORNING OF SURGERY with CHG.   2. If you chose to wash your hair, wash your hair first as usual with your normal shampoo.  3. After you shampoo, rinse your hair and body thoroughly to remove the shampoo.  4. Use CHG as you would any other liquid soap. You can apply CHG directly to the skin and wash gently with a scrungie or a clean washcloth.   5. Apply the CHG Soap to your body ONLY FROM THE NECK DOWN.  Do not use on open wounds or open sores. Avoid contact with your eyes, ears, mouth and genitals (private parts). Wash genitals (private parts) with your normal soap.  6. Wash thoroughly, paying special attention to the area where your surgery will be performed.  7. Thoroughly rinse your body with warm water from the neck down.  8. DO NOT shower/wash with your normal soap after using and rinsing off the CHG Soap.  9. Pat yourself dry with a CLEAN TOWEL.   10. Wear CLEAN PAJAMAS   11. Place CLEAN SHEETS on your bed the night of your first shower and DO  NOT SLEEP WITH PETS.    Day of Surgery: Do not apply any deodorants/lotions. Please wear clean clothes to the hospital/surgery center.      Please read over the following fact sheets that you were given.

## 2017-05-08 ENCOUNTER — Encounter (HOSPITAL_COMMUNITY)
Admission: RE | Admit: 2017-05-08 | Discharge: 2017-05-08 | Disposition: A | Discharge status: Home / Self Care | Payer: BLUE CROSS/BLUE SHIELD | Source: Ambulatory Visit | Attending: Orthopedic Surgery | Admitting: Orthopedic Surgery

## 2017-05-08 ENCOUNTER — Encounter (HOSPITAL_COMMUNITY): Payer: Self-pay

## 2017-05-08 DIAGNOSIS — G959 Disease of spinal cord, unspecified: Secondary | ICD-10-CM | POA: Insufficient documentation

## 2017-05-08 DIAGNOSIS — Z01818 Encounter for other preprocedural examination: Secondary | ICD-10-CM | POA: Insufficient documentation

## 2017-05-08 DIAGNOSIS — Z01812 Encounter for preprocedural laboratory examination: Secondary | ICD-10-CM | POA: Diagnosis not present

## 2017-05-08 DIAGNOSIS — Z0183 Encounter for blood typing: Secondary | ICD-10-CM | POA: Insufficient documentation

## 2017-05-08 HISTORY — DX: Bariatric surgery status: Z98.84

## 2017-05-08 HISTORY — DX: Infectious mononucleosis, unspecified without complication: B27.90

## 2017-05-08 HISTORY — DX: Personal history of other infectious and parasitic diseases: Z86.19

## 2017-05-08 HISTORY — DX: Sleep apnea, unspecified: G47.30

## 2017-05-08 HISTORY — DX: Unspecified asthma, uncomplicated: J45.909

## 2017-05-08 LAB — CBC WITH DIFFERENTIAL/PLATELET
Basophils Absolute: 0.1 10*3/uL (ref 0.0–0.1)
Basophils Relative: 1 %
Eosinophils Absolute: 0.2 10*3/uL (ref 0.0–0.7)
Eosinophils Relative: 3 %
HCT: 42.7 % (ref 36.0–46.0)
Hemoglobin: 14.1 g/dL (ref 12.0–15.0)
Lymphocytes Relative: 29 %
Lymphs Abs: 2.7 10*3/uL (ref 0.7–4.0)
MCH: 28.8 pg (ref 26.0–34.0)
MCHC: 33 g/dL (ref 30.0–36.0)
MCV: 87.3 fL (ref 78.0–100.0)
Monocytes Absolute: 0.5 10*3/uL (ref 0.1–1.0)
Monocytes Relative: 5 %
Neutro Abs: 6 10*3/uL (ref 1.7–7.7)
Neutrophils Relative %: 62 %
Platelets: 248 10*3/uL (ref 150–400)
RBC: 4.89 MIL/uL (ref 3.87–5.11)
RDW: 13.6 % (ref 11.5–15.5)
WBC: 9.6 10*3/uL (ref 4.0–10.5)

## 2017-05-08 LAB — COMPREHENSIVE METABOLIC PANEL
ALT: 28 U/L (ref 14–54)
AST: 28 U/L (ref 15–41)
Albumin: 4 g/dL (ref 3.5–5.0)
Alkaline Phosphatase: 66 U/L (ref 38–126)
Anion gap: 5 (ref 5–15)
BUN: 14 mg/dL (ref 6–20)
CO2: 24 mmol/L (ref 22–32)
Calcium: 9.5 mg/dL (ref 8.9–10.3)
Chloride: 108 mmol/L (ref 101–111)
Creatinine, Ser: 0.58 mg/dL (ref 0.44–1.00)
GFR calc Af Amer: >60 mL/min (ref >60)
GFR calc non Af Amer: >60 mL/min (ref >60)
Glucose, Bld: 83 mg/dL (ref 65–99)
Potassium: 3.9 mmol/L (ref 3.5–5.1)
Sodium: 137 mmol/L (ref 135–145)
Total Bilirubin: 0.5 mg/dL (ref 0.3–1.2)
Total Protein: 7 g/dL (ref 6.5–8.1)

## 2017-05-08 LAB — SURGICAL PCR SCREEN
MRSA, PCR: NEGATIVE
Staphylococcus aureus: POSITIVE — AB

## 2017-05-08 LAB — HCG, SERUM, QUALITATIVE: Preg, Serum: NEGATIVE

## 2017-05-08 LAB — PROTIME-INR
INR: 0.98
Prothrombin Time: 13 seconds (ref 11.4–15.2)

## 2017-05-08 LAB — GLUCOSE, CAPILLARY: Glucose-Capillary: 86 mg/dL (ref 65–99)

## 2017-05-08 NOTE — Progress Notes (Signed)
Unable to get urine sample at PAT appointment.  Patient instructed to bring it in before surgery if she could and if not to bring it in the day of surgery

## 2017-05-08 NOTE — Progress Notes (Signed)
[facility], patient verbalized understanding of instructions

## 2017-05-08 NOTE — Progress Notes (Signed)
PCP - Denise Gay Cardiologist - denies  Chest x-ray - 05/08/17 EKG - 05/08/17 Stress Test - denies ECHO - denies Cardiac Cath - denies  Sleep Study - sleep study 7 years ago before gastric bypass surgery all she could tell me was somewhere in [place], [place] CPAP - does not wear since gastric bypass  Patient was diabetic before gastric bypass. She is not currently taking medications for diabetes.  Blood sugar today was 86    Patient denies shortness of breath, fever, cough and chest pain at PAT appointment   Patient verbalized understanding of instructions that were given to them at the PAT appointment. Patient was also instructed that they will need to review over the PAT instructions again at home before surgery.

## 2017-05-12 NOTE — Anesthesia Preprocedure Evaluation (Addendum)
Anesthesia Evaluation  Patient identified by MRN, date of birth, ID band Patient awake    Reviewed: Allergy & Precautions, H&P , NPO status , Patient's Chart, lab work & pertinent test results  Airway Mallampati: II  TM Distance: >3 FB Neck ROM: Full    Dental no notable dental hx. (+) Teeth Intact, Dental Advisory Given   Pulmonary neg pulmonary ROS, former smoker,    Pulmonary exam normal breath sounds clear to auscultation       Cardiovascular Exercise Tolerance: Good hypertension, Pt. on medications  Rhythm:Regular Rate:Normal     Neuro/Psych negative neurological ROS  negative psych ROS   GI/Hepatic negative GI ROS, Neg liver ROS,   Endo/Other  negative endocrine ROSdiabetes, Well Controlled  Renal/GU negative Renal ROS  negative genitourinary   Musculoskeletal   Abdominal   Peds  Hematology negative hematology ROS (+)   Anesthesia Other Findings   Reproductive/Obstetrics negative OB ROS                           Anesthesia Physical Anesthesia Plan  ASA: II  Anesthesia Plan: General   Post-op Pain Management:    Induction: Intravenous  PONV Risk Score and Plan: 4 or greater and Ondansetron, Dexamethasone and Midazolam  Airway Management Planned: Oral ETT and Video Laryngoscope Planned  Additional Equipment:   Intra-op Plan:   Post-operative Plan: Extubation in OR  Informed Consent: I have reviewed the patients History and Physical, chart, labs and discussed the procedure including the risks, benefits and alternatives for the proposed anesthesia with the patient or authorized representative who has indicated his/her understanding and acceptance.   Dental advisory given  Plan Discussed with: CRNA, Anesthesiologist and Surgeon  Anesthesia Plan Comments:       Anesthesia Quick Evaluation

## 2017-05-13 ENCOUNTER — Ambulatory Visit (HOSPITAL_COMMUNITY): Payer: BLUE CROSS/BLUE SHIELD

## 2017-05-13 ENCOUNTER — Observation Stay (HOSPITAL_COMMUNITY)
Admission: RE | Admit: 2017-05-13 | Discharge: 2017-05-14 | Disposition: A | Discharge status: Home / Self Care | Payer: BLUE CROSS/BLUE SHIELD | Source: Ambulatory Visit | Attending: Orthopedic Surgery | Admitting: Orthopedic Surgery

## 2017-05-13 ENCOUNTER — Ambulatory Visit (HOSPITAL_COMMUNITY): Payer: BLUE CROSS/BLUE SHIELD | Admitting: Anesthesiology

## 2017-05-13 ENCOUNTER — Ambulatory Visit (HOSPITAL_COMMUNITY)
Admission: RE | Disposition: A | Discharge status: Home / Self Care | Payer: Self-pay | Source: Ambulatory Visit | Attending: Orthopedic Surgery

## 2017-05-13 ENCOUNTER — Encounter (HOSPITAL_COMMUNITY): Payer: Self-pay

## 2017-05-13 DIAGNOSIS — M5001 Cervical disc disorder with myelopathy,  high cervical region: Secondary | ICD-10-CM | POA: Diagnosis present

## 2017-05-13 DIAGNOSIS — Z8249 Family history of ischemic heart disease and other diseases of the circulatory system: Secondary | ICD-10-CM | POA: Diagnosis not present

## 2017-05-13 DIAGNOSIS — Z87891 Personal history of nicotine dependence: Secondary | ICD-10-CM | POA: Insufficient documentation

## 2017-05-13 DIAGNOSIS — Z472 Encounter for removal of internal fixation device: Secondary | ICD-10-CM | POA: Diagnosis not present

## 2017-05-13 DIAGNOSIS — Z833 Family history of diabetes mellitus: Secondary | ICD-10-CM | POA: Insufficient documentation

## 2017-05-13 DIAGNOSIS — M5410 Radiculopathy, site unspecified: Secondary | ICD-10-CM | POA: Diagnosis present

## 2017-05-13 DIAGNOSIS — I10 Essential (primary) hypertension: Secondary | ICD-10-CM | POA: Insufficient documentation

## 2017-05-13 DIAGNOSIS — Z419 Encounter for procedure for purposes other than remedying health state, unspecified: Secondary | ICD-10-CM

## 2017-05-13 DIAGNOSIS — Z9884 Bariatric surgery status: Secondary | ICD-10-CM | POA: Insufficient documentation

## 2017-05-13 DIAGNOSIS — Z79899 Other long term (current) drug therapy: Secondary | ICD-10-CM | POA: Diagnosis not present

## 2017-05-13 HISTORY — PX: ANTERIOR CERVICAL DECOMP/DISCECTOMY FUSION: SHX1161

## 2017-05-13 LAB — GLUCOSE, CAPILLARY
Glucose-Capillary: 197 mg/dL — ABNORMAL HIGH (ref 65–99)
Glucose-Capillary: 170 mg/dL — ABNORMAL HIGH (ref 65–99)

## 2017-05-13 SURGERY — ANTERIOR CERVICAL DECOMPRESSION/DISCECTOMY FUSION 2 LEVELS
Anesthesia: General

## 2017-05-13 MED ORDER — DEXTROSE 5 % IV SOLN
INTRAVENOUS | Status: DC | PRN
  Administered 2017-05-13: 40 ug/min via INTRAVENOUS

## 2017-05-13 MED ORDER — HYDROMORPHONE HCL 1 MG/ML IJ SOLN
INTRAMUSCULAR | Status: AC
  Administered 2017-05-13: 0.5 mg via INTRAVENOUS
  Filled 2017-05-13: qty 0.5

## 2017-05-13 MED ORDER — FENTANYL CITRATE (PF) 100 MCG/2ML IJ SOLN
INTRAMUSCULAR | Status: DC | PRN
  Administered 2017-05-13 (×3): 50 ug via INTRAVENOUS
  Administered 2017-05-13: 150 ug via INTRAVENOUS

## 2017-05-13 MED ORDER — DIAZEPAM 5 MG PO TABS
ORAL_TABLET | ORAL | Status: AC
  Administered 2017-05-13: 5 mg via ORAL
  Filled 2017-05-13: qty 1

## 2017-05-13 MED ORDER — ONDANSETRON HCL 4 MG/2ML IJ SOLN
INTRAMUSCULAR | Status: DC | PRN
  Administered 2017-05-13: 4 mg via INTRAVENOUS

## 2017-05-13 MED ORDER — CEFAZOLIN SODIUM-DEXTROSE 2-4 GM/100ML-% IV SOLN
2.0000 g | INTRAVENOUS | Status: AC
  Administered 2017-05-13: 2 g via INTRAVENOUS

## 2017-05-13 MED ORDER — INSULIN ASPART 100 UNIT/ML ~~LOC~~ SOLN
0.0000 [IU] | Freq: Three times a day (TID) | SUBCUTANEOUS | Status: DC
  Administered 2017-05-13: 3 [IU] via SUBCUTANEOUS

## 2017-05-13 MED ORDER — FLEET ENEMA 7-19 GM/118ML RE ENEM: 1.0000 | ENEMA | Freq: Once | RECTAL | Status: DC | PRN

## 2017-05-13 MED ORDER — FENTANYL CITRATE (PF) 250 MCG/5ML IJ SOLN
INTRAMUSCULAR | Status: AC
  Filled 2017-05-13: qty 5

## 2017-05-13 MED ORDER — POVIDONE-IODINE 7.5 % EX SOLN
Freq: Once | CUTANEOUS | Status: DC
  Filled 2017-05-13: qty 118

## 2017-05-13 MED ORDER — SUCCINYLCHOLINE CHLORIDE 200 MG/10ML IV SOSY
PREFILLED_SYRINGE | INTRAVENOUS | Status: AC
  Filled 2017-05-13: qty 10

## 2017-05-13 MED ORDER — MORPHINE SULFATE (PF) 4 MG/ML IV SOLN
INTRAVENOUS | Status: AC
  Administered 2017-05-13: 2 mg via INTRAVENOUS
  Filled 2017-05-13: qty 1

## 2017-05-13 MED ORDER — CEFAZOLIN SODIUM-DEXTROSE 2-4 GM/100ML-% IV SOLN
INTRAVENOUS | Status: AC
  Filled 2017-05-13: qty 100

## 2017-05-13 MED ORDER — ZOLPIDEM TARTRATE 5 MG PO TABS
5.0000 mg | ORAL_TABLET | Freq: Every evening | ORAL | Status: DC | PRN
  Administered 2017-05-13: 5 mg via ORAL
  Filled 2017-05-13: qty 1

## 2017-05-13 MED ORDER — 0.9 % SODIUM CHLORIDE (POUR BTL) OPTIME
TOPICAL | Status: DC | PRN
  Administered 2017-05-13: 1000 mL

## 2017-05-13 MED ORDER — HYDROMORPHONE HCL 1 MG/ML IJ SOLN
INTRAMUSCULAR | Status: AC
  Administered 2017-05-13: 0.5 mg via INTRAVENOUS
  Filled 2017-05-13: qty 1

## 2017-05-13 MED ORDER — LIDOCAINE HCL (CARDIAC) 20 MG/ML IV SOLN
INTRAVENOUS | Status: DC | PRN
  Administered 2017-05-13: 60 mg via INTRAVENOUS

## 2017-05-13 MED ORDER — MIDAZOLAM HCL 5 MG/5ML IJ SOLN
INTRAMUSCULAR | Status: DC | PRN
  Administered 2017-05-13: 2 mg via INTRAVENOUS

## 2017-05-13 MED ORDER — DOCUSATE SODIUM 100 MG PO CAPS
100.0000 mg | ORAL_CAPSULE | Freq: Two times a day (BID) | ORAL | Status: DC
  Administered 2017-05-13 (×2): 100 mg via ORAL
  Filled 2017-05-13 (×2): qty 1

## 2017-05-13 MED ORDER — TOPIRAMATE 100 MG PO TABS
100.0000 mg | ORAL_TABLET | Freq: Every day | ORAL | Status: DC
  Administered 2017-05-13: 100 mg via ORAL
  Filled 2017-05-13: qty 1

## 2017-05-13 MED ORDER — PANTOPRAZOLE SODIUM 40 MG IV SOLR: 40.0000 mg | Freq: Every day | INTRAVENOUS | Status: DC

## 2017-05-13 MED ORDER — ONDANSETRON HCL 4 MG/2ML IJ SOLN
INTRAMUSCULAR | Status: AC
  Filled 2017-05-13: qty 2

## 2017-05-13 MED ORDER — PANTOPRAZOLE SODIUM 40 MG PO TBEC
40.0000 mg | DELAYED_RELEASE_TABLET | Freq: Every day | ORAL | Status: DC
  Administered 2017-05-13: 40 mg via ORAL

## 2017-05-13 MED ORDER — OXYCODONE-ACETAMINOPHEN 5-325 MG PO TABS
ORAL_TABLET | ORAL | Status: AC
  Filled 2017-05-13: qty 2

## 2017-05-13 MED ORDER — LOSARTAN POTASSIUM 50 MG PO TABS
100.0000 mg | ORAL_TABLET | Freq: Every day | ORAL | Status: DC
  Administered 2017-05-13: 100 mg via ORAL
  Filled 2017-05-13: qty 2

## 2017-05-13 MED ORDER — OXYCODONE-ACETAMINOPHEN 5-325 MG PO TABS
1.0000 | ORAL_TABLET | ORAL | Status: DC | PRN
  Administered 2017-05-13 – 2017-05-14 (×6): 2 via ORAL
  Filled 2017-05-13 (×5): qty 2

## 2017-05-13 MED ORDER — POTASSIUM CHLORIDE IN NACL 20-0.9 MEQ/L-% IV SOLN: INTRAVENOUS | Status: DC

## 2017-05-13 MED ORDER — PROPOFOL 10 MG/ML IV BOLUS
INTRAVENOUS | Status: DC | PRN
  Administered 2017-05-13: 130 mg via INTRAVENOUS

## 2017-05-13 MED ORDER — PAROXETINE HCL 30 MG PO TABS
30.0000 mg | ORAL_TABLET | Freq: Every day | ORAL | Status: DC
  Filled 2017-05-13: qty 1

## 2017-05-13 MED ORDER — SENNOSIDES-DOCUSATE SODIUM 8.6-50 MG PO TABS: 1.0000 | ORAL_TABLET | Freq: Every evening | ORAL | Status: DC | PRN

## 2017-05-13 MED ORDER — MORPHINE SULFATE (PF) 2 MG/ML IV SOLN: 1.0000 mg | INTRAVENOUS | Status: DC | PRN

## 2017-05-13 MED ORDER — SODIUM CHLORIDE 0.9% FLUSH: 3.0000 mL | INTRAVENOUS | Status: DC | PRN

## 2017-05-13 MED ORDER — THROMBIN 20000 UNITS EX KIT
PACK | CUTANEOUS | Status: DC | PRN
  Administered 2017-05-13: 20000 [IU] via TOPICAL

## 2017-05-13 MED ORDER — PHENOL 1.4 % MT LIQD
1.0000 | OROMUCOSAL | Status: DC | PRN
  Filled 2017-05-13: qty 177

## 2017-05-13 MED ORDER — ACETAMINOPHEN 325 MG PO TABS: 650.0000 mg | ORAL_TABLET | ORAL | Status: DC | PRN

## 2017-05-13 MED ORDER — MIDAZOLAM HCL 2 MG/2ML IJ SOLN
INTRAMUSCULAR | Status: AC
  Filled 2017-05-13: qty 2

## 2017-05-13 MED ORDER — HYDROMORPHONE HCL 1 MG/ML IJ SOLN
0.2500 mg | INTRAMUSCULAR | Status: DC | PRN
  Administered 2017-05-13 (×4): 0.5 mg via INTRAVENOUS

## 2017-05-13 MED ORDER — BISACODYL 5 MG PO TBEC: 5.0000 mg | DELAYED_RELEASE_TABLET | Freq: Every day | ORAL | Status: DC | PRN

## 2017-05-13 MED ORDER — BUPIVACAINE-EPINEPHRINE 0.25% -1:200000 IJ SOLN
INTRAMUSCULAR | Status: DC | PRN
  Administered 2017-05-13: 3 mL

## 2017-05-13 MED ORDER — ONDANSETRON HCL 4 MG/2ML IJ SOLN: 4.0000 mg | Freq: Four times a day (QID) | INTRAMUSCULAR | Status: DC | PRN

## 2017-05-13 MED ORDER — ONDANSETRON HCL 4 MG PO TABS: 4.0000 mg | ORAL_TABLET | Freq: Four times a day (QID) | ORAL | Status: DC | PRN

## 2017-05-13 MED ORDER — MENTHOL 3 MG MT LOZG: 1.0000 | LOZENGE | OROMUCOSAL | Status: DC | PRN

## 2017-05-13 MED ORDER — ALUM & MAG HYDROXIDE-SIMETH 200-200-20 MG/5ML PO SUSP: 30.0000 mL | Freq: Four times a day (QID) | ORAL | Status: DC | PRN

## 2017-05-13 MED ORDER — BUPIVACAINE-EPINEPHRINE (PF) 0.25% -1:200000 IJ SOLN
INTRAMUSCULAR | Status: AC
  Filled 2017-05-13: qty 30

## 2017-05-13 MED ORDER — INSULIN ASPART 100 UNIT/ML ~~LOC~~ SOLN: 0.0000 [IU] | Freq: Every day | SUBCUTANEOUS | Status: DC

## 2017-05-13 MED ORDER — LACTATED RINGERS IV SOLN
INTRAVENOUS | Status: DC | PRN
  Administered 2017-05-13 (×2): via INTRAVENOUS

## 2017-05-13 MED ORDER — SUGAMMADEX SODIUM 200 MG/2ML IV SOLN
INTRAVENOUS | Status: DC | PRN
  Administered 2017-05-13: 200 mg via INTRAVENOUS

## 2017-05-13 MED ORDER — THROMBIN 20000 UNITS EX SOLR
CUTANEOUS | Status: AC
  Filled 2017-05-13: qty 20000

## 2017-05-13 MED ORDER — CEFAZOLIN SODIUM-DEXTROSE 2-4 GM/100ML-% IV SOLN
2.0000 g | Freq: Three times a day (TID) | INTRAVENOUS | Status: AC
  Administered 2017-05-13 (×2): 2 g via INTRAVENOUS
  Filled 2017-05-13 (×2): qty 100

## 2017-05-13 MED ORDER — MORPHINE SULFATE (PF) 4 MG/ML IV SOLN: 1.0000 mg | INTRAVENOUS | Status: DC | PRN

## 2017-05-13 MED ORDER — PROPOFOL 10 MG/ML IV BOLUS
INTRAVENOUS | Status: AC
  Filled 2017-05-13: qty 20

## 2017-05-13 MED ORDER — EPHEDRINE 5 MG/ML INJ
INTRAVENOUS | Status: AC
  Filled 2017-05-13: qty 10

## 2017-05-13 MED ORDER — DEXAMETHASONE SODIUM PHOSPHATE 10 MG/ML IJ SOLN
INTRAMUSCULAR | Status: DC | PRN
  Administered 2017-05-13: 5 mg via INTRAVENOUS

## 2017-05-13 MED ORDER — MORPHINE SULFATE (PF) 4 MG/ML IV SOLN
2.0000 mg | INTRAVENOUS | Status: DC | PRN
  Administered 2017-05-13 (×4): 2 mg via INTRAVENOUS

## 2017-05-13 MED ORDER — SODIUM CHLORIDE 0.9% FLUSH: 3.0000 mL | Freq: Two times a day (BID) | INTRAVENOUS | Status: DC

## 2017-05-13 MED ORDER — SODIUM CHLORIDE 0.9 % IV SOLN: 250.0000 mL | INTRAVENOUS | Status: DC

## 2017-05-13 MED ORDER — DIAZEPAM 5 MG PO TABS
5.0000 mg | ORAL_TABLET | Freq: Four times a day (QID) | ORAL | Status: DC | PRN
  Administered 2017-05-13 – 2017-05-14 (×4): 5 mg via ORAL
  Filled 2017-05-13 (×3): qty 1

## 2017-05-13 MED ORDER — ROCURONIUM BROMIDE 100 MG/10ML IV SOLN
INTRAVENOUS | Status: DC | PRN
  Administered 2017-05-13: 10 mg via INTRAVENOUS
  Administered 2017-05-13: 50 mg via INTRAVENOUS
  Administered 2017-05-13 (×5): 10 mg via INTRAVENOUS

## 2017-05-13 MED ORDER — ACETAMINOPHEN 650 MG RE SUPP: 650.0000 mg | RECTAL | Status: DC | PRN

## 2017-05-13 SURGICAL SUPPLY — 73 items
BENZOIN TINCTURE PRP APPL 2/3 (GAUZE/BANDAGES/DRESSINGS) ×2 IMPLANT
BIT DRILL NEURO 2X3.1 SFT TUCH (MISCELLANEOUS) ×1 IMPLANT
BIT DRILL SRG 14X2.2XFLT CHK (BIT) ×1 IMPLANT
BIT DRL SRG 14X2.2XFLT CHK (BIT) ×1
BLADE CLIPPER SURG (BLADE) ×2 IMPLANT
BLADE SURG 15 STRL LF DISP TIS (BLADE) ×1 IMPLANT
BLADE SURG 15 STRL SS (BLADE) ×1
BONE VIVIGEN FORMABLE 1.3CC (Bone Implant) ×2 IMPLANT
BUR MATCHSTICK NEURO 3.0 LAGG (BURR) IMPLANT
CARTRIDGE OIL MAESTRO DRILL (MISCELLANEOUS) ×1 IMPLANT
COLLAR CERV LO CONTOUR FIRM DE (SOFTGOODS) IMPLANT
CORDS BIPOLAR (ELECTRODE) ×2 IMPLANT
COVER SURGICAL LIGHT HANDLE (MISCELLANEOUS) ×2 IMPLANT
CRADLE DONUT ADULT HEAD (MISCELLANEOUS) ×2 IMPLANT
DECANTER SPIKE VIAL GLASS SM (MISCELLANEOUS) ×2 IMPLANT
DIFFUSER DRILL AIR PNEUMATIC (MISCELLANEOUS) ×2 IMPLANT
DRAIN JACKSON RD 7FR 3/32 (WOUND CARE) IMPLANT
DRAPE C-ARM 42X72 X-RAY (DRAPES) ×2 IMPLANT
DRAPE POUCH INSTRU U-SHP 10X18 (DRAPES) ×2 IMPLANT
DRAPE SURG 17X23 STRL (DRAPES) ×8 IMPLANT
DRILL BIT SKYLINE 14MM (BIT) ×1
DRILL NEURO 2X3.1 SOFT TOUCH (MISCELLANEOUS) ×2
DURAPREP 26ML APPLICATOR (WOUND CARE) ×2 IMPLANT
ELECT COATED BLADE 2.86 ST (ELECTRODE) ×2 IMPLANT
ELECT REM PT RETURN 9FT ADLT (ELECTROSURGICAL) ×2
ELECTRODE REM PT RTRN 9FT ADLT (ELECTROSURGICAL) ×1 IMPLANT
EVACUATOR SILICONE 100CC (DRAIN) IMPLANT
GAUZE SPONGE 4X4 12PLY STRL (GAUZE/BANDAGES/DRESSINGS) ×2 IMPLANT
GAUZE SPONGE 4X4 16PLY XRAY LF (GAUZE/BANDAGES/DRESSINGS) ×2 IMPLANT
GLOVE BIO SURGEON STRL SZ7 (GLOVE) ×2 IMPLANT
GLOVE BIO SURGEON STRL SZ8 (GLOVE) ×2 IMPLANT
GLOVE BIOGEL PI IND STRL 7.0 (GLOVE) ×4 IMPLANT
GLOVE BIOGEL PI IND STRL 8 (GLOVE) ×1 IMPLANT
GLOVE BIOGEL PI INDICATOR 7.0 (GLOVE) ×4
GLOVE BIOGEL PI INDICATOR 8 (GLOVE) ×1
GLOVE SURG SS PI 6.5 STRL IVOR (GLOVE) ×4 IMPLANT
GOWN STRL REUS W/ TWL LRG LVL3 (GOWN DISPOSABLE) ×3 IMPLANT
GOWN STRL REUS W/ TWL XL LVL3 (GOWN DISPOSABLE) ×1 IMPLANT
GOWN STRL REUS W/TWL LRG LVL3 (GOWN DISPOSABLE) ×3
GOWN STRL REUS W/TWL XL LVL3 (GOWN DISPOSABLE) ×1
INTERLOCK LRDTC CRVCL VBR 7MM (Bone Implant) ×2 IMPLANT
IV CATH 14GX2 1/4 (CATHETERS) ×2 IMPLANT
KIT BASIN OR (CUSTOM PROCEDURE TRAY) ×2 IMPLANT
KIT ROOM TURNOVER OR (KITS) ×2 IMPLANT
LORDOTIC CERVICAL VBR 7MM SM (Bone Implant) ×4 IMPLANT
MANIFOLD NEPTUNE II (INSTRUMENTS) ×2 IMPLANT
NEEDLE PRECISIONGLIDE 27X1.5 (NEEDLE) ×2 IMPLANT
NEEDLE SPNL 20GX3.5 QUINCKE YW (NEEDLE) ×2 IMPLANT
NS IRRIG 1000ML POUR BTL (IV SOLUTION) ×2 IMPLANT
OIL CARTRIDGE MAESTRO DRILL (MISCELLANEOUS) ×2
PACK ORTHO CERVICAL (CUSTOM PROCEDURE TRAY) ×2 IMPLANT
PAD ARMBOARD 7.5X6 YLW CONV (MISCELLANEOUS) ×4 IMPLANT
PATTIES SURGICAL .5 X.5 (GAUZE/BANDAGES/DRESSINGS) IMPLANT
PATTIES SURGICAL .5 X1 (DISPOSABLE) ×2 IMPLANT
PIN DISTRACTION 14 (PIN) ×4 IMPLANT
PLATE SKYLINE TWO LEVEL 32MM (Plate) ×2 IMPLANT
SCREW SKYLINE VAR OS 14MM (Screw) ×12 IMPLANT
SPONGE INTESTINAL PEANUT (DISPOSABLE) ×2 IMPLANT
SPONGE SURGIFOAM ABS GEL 100 (HEMOSTASIS) ×2 IMPLANT
STRIP CLOSURE SKIN 1/2X4 (GAUZE/BANDAGES/DRESSINGS) ×2 IMPLANT
SURGIFLO W/THROMBIN 8M KIT (HEMOSTASIS) IMPLANT
SUT MNCRL AB 4-0 PS2 18 (SUTURE) ×2 IMPLANT
SUT SILK 4 0 (SUTURE)
SUT SILK 4-0 18XBRD TIE 12 (SUTURE) IMPLANT
SUT VIC AB 2-0 CT2 18 VCP726D (SUTURE) ×2 IMPLANT
SYR BULB IRRIGATION 50ML (SYRINGE) ×2 IMPLANT
SYR CONTROL 10ML LL (SYRINGE) ×6 IMPLANT
TAPE CLOTH 4X10 WHT NS (GAUZE/BANDAGES/DRESSINGS) ×2 IMPLANT
TAPE UMBILICAL COTTON 1/8X30 (MISCELLANEOUS) ×2 IMPLANT
TOWEL OR 17X24 6PK STRL BLUE (TOWEL DISPOSABLE) ×2 IMPLANT
TOWEL OR 17X26 10 PK STRL BLUE (TOWEL DISPOSABLE) ×2 IMPLANT
WATER STERILE IRR 1000ML POUR (IV SOLUTION) ×2 IMPLANT
YANKAUER SUCT BULB TIP NO VENT (SUCTIONS) ×2 IMPLANT

## 2017-05-13 NOTE — Transfer of Care (Signed)
Immediate Anesthesia Transfer of Care Note  Patient: Denise Gay  Procedure(s) Performed: Procedure(s) with comments: ANTERIOR CERVICAL DECOMPRESSION FUSION, CERVICAL 3-4, CERVICAL 4-5 WITH INSTRUMENTATION AND ALLOGRAFT; HARDWARE REMOVAL AT CERVICAL 5-6, CERVICAL 6-7. (N/A) - ANTERIOR CERVICAL DECOMPRESSION FUSION, CERVICAL 3-4, CERVICAL 4-5 WITH INSTRUMENTATION AND ALLOGRAFT; HARDWARE REMOVAL AT CERVICAL 5-6, CERVICAL 6-7; REQUEST 3.5 HOURS  Patient Location: PACU  Anesthesia Type:General  Level of Consciousness: awake, alert , oriented and patient cooperative  Airway & Oxygen Therapy: Patient Spontanous Breathing and Patient connected to nasal cannula oxygen  Post-op Assessment: Report given to RN, Post -op Vital signs reviewed and stable, Patient moving all extremities X 4 and Patient able to stick tongue midline  Post vital signs: Reviewed and stable  Last Vitals:  Vitals:   05/13/17 0640 05/13/17 1100  BP: (!) 157/87 (!) 160/91  Pulse: 74 (!) 102  Resp: 18 14  Temp: 36.8 C 37.4 C    Last Pain:  Vitals:   05/13/17 0640  TempSrc: Oral  PainSc:       Patients Stated Pain Goal: 3 (13/24/40 1027)  Complications: No apparent anesthesia complications

## 2017-05-13 NOTE — Anesthesia Postprocedure Evaluation (Signed)
Anesthesia Post Note  Patient: Denise Gay  Procedure(s) Performed: Procedure(s) (LRB): ANTERIOR CERVICAL DECOMPRESSION FUSION, CERVICAL 3-4, CERVICAL 4-5 WITH INSTRUMENTATION AND ALLOGRAFT; HARDWARE REMOVAL AT CERVICAL 5-6, CERVICAL 6-7. (N/A)     Patient location during evaluation: PACU Anesthesia Type: General Level of consciousness: awake and alert Pain management: pain level controlled Vital Signs Assessment: post-procedure vital signs reviewed and stable Respiratory status: spontaneous breathing, nonlabored ventilation and respiratory function stable Cardiovascular status: blood pressure returned to baseline and stable Postop Assessment: no signs of nausea or vomiting Anesthetic complications: no    Last Vitals:  Vitals:   05/13/17 1300 05/13/17 1315  BP:  129/77  Pulse: 81 83  Resp: 13 13  Temp: 37 C     Last Pain:  Vitals:   05/13/17 1315  TempSrc:   PainSc: 3                  Livvy Spilman,W. EDMOND

## 2017-05-13 NOTE — Op Note (Signed)
NAME:  Denise Gay, Denise Gay               ACCOUNT NO.:  [id number]  MEDICAL RECORD NO.:  [id number]  LOCATION:                                 FACILITY:  PHYSICIAN:  Phylliss Bob, MD           DATE OF BIRTH:  DATE OF PROCEDURE:  05/13/2017                              OPERATIVE REPORT   PREOPERATIVE DIAGNOSES: 1. Cervical myelopathy. 2. Spinal cord compression at C3-4, C4-5. 3. Status post previous anterior cervical decompression and fusion     with instrumentation at C5-6, C6-7.  POSTOPERATIVE DIAGNOSES: 1. Cervical myelopathy. 2. Spinal cord compression at C3-4, C4-5. 3. Status post previous anterior cervical decompression and fusion     with instrumentation at C5-6, C6-7.  PROCEDURES: 1. Removal of anterior instrumentation (anterior cervical plate and     screws spanning C5-C7). 2. Anterior cervical decompression and fusion at C3-4, C4-5. 3. Placement of anterior instrumentation at C3-C5. 4. Insertion of interbody device x2 (Titan intervertebral spacers). 5. Use of morselized allograft - ViviGen. 6. Intraoperative use of fluoroscopy.  SURGEON:  Phylliss Bob, MD  ASSISTANT:  Pricilla Holm, PAC.  ANESTHESIA:  General endotracheal anesthesia.  COMPLICATIONS:  None.  DISPOSITION:  Stable.  ESTIMATED BLOOD LOSS:  Minimal.  INDICATIONS FOR SURGERY:  Briefly, [name] is a very pleasant 46- year-old female, who did present to me with progressive cervical myelopathy.  Of note, she is status post a previous C5-6 and C6-7 ACDF from approximately 10 years prior with retained hardware.  The patient's MRI did reveal spinal cord compression at C3-4 and C4-5.  Given the patient's ongoing deterioration in her balance in addition to her other symptoms associated with cervical myelopathy, we did elect to proceed with the procedure outlined above.  The patient was fully aware of the risks and limitations of surgery and did elect to proceed.  OPERATIVE DETAILS:  On May 13, 2017,  the patient was brought to Surgery and general endotracheal anesthesia was administered.  The patient was placed supine on the hospital bed.  Her neck was very minimally and gently extended.  Her arms were secured to her sides and all bony prominences were padded.  A left-sided transverse incision was then made.  The platysma was incised and a Smith-Robinson approach was used. The anterior spine was noted.  The previously placed plate across F7-T0 was identified.  Of note, there were substantial osteophytes noted encompassing the plate, particularly over the superior aspect of the plate.  These were removed.  All 6 screws into the C5, C6, and C7 vertebral bodies were removed, as was the anterior cervical plate.  Bone wax was placed in the holes that remained from removal of the screws.  A self-retaining retractor was then placed, centered over the C4-5 intervertebral space.  Caspar pins were placed into the C4 and C5 vertebral bodies and distraction was applied.  Extensive osteophytes were noted anteriorly, and were removed.  I then proceeded with a thorough and complete C4-5 intervertebral diskectomy, with decompression of the spinal canal and right and left neural foramina.  The endplates were then prepared and the appropriate size interbody spacer was packed with ViviGen and tamped into  position in the usual fashion.  I was very pleased with the press-fit of the implant.  The lower Caspar pin was then removed and bone wax was placed in its place.  A new Caspar pin was placed into the C3 vertebral body and distraction was then applied across the C3-4 intervertebral space.  Once again, a thorough diskectomy and decompression was performed.  After preparing the endplates, the appropriate size interbody spacer was packed with ViviGen and tamped into position in the usual fashion.  The Caspar pins were then removed. The appropriate-sized anterior cervical plate was placed over the anterior  spine.  14-mm variable angle screws were placed, 2 in each vertebral body at C3, C4, and C5.  The plates were then locked to the plate using the Cam locking mechanism.  The wound was then copiously irrigated.  I was very pleased with the final fluoroscopic images.  The wound was then closed in layers using 2-0 Vicryl followed by 4-0 Monocryl.  Benzoin and Steri-Strips were applied followed by a sterile dressing.  All instrument counts were correct at the termination of the procedure.  Of note, Pricilla Holm was my assistant throughout surgery and did aid in retraction, suctioning, and closure from start to finish.     Phylliss Bob, MD   ______________________________ Phylliss Bob, MD    MD/MEDQ  D:  05/13/2017  T:  05/13/2017  Job:  [id number]  cc:   Silverio Decamp, MD [facility]

## 2017-05-13 NOTE — H&P (Signed)
PREOPERATIVE H&P  Chief Complaint: Bilateral arm and hand tingling and weakness  HPI: Denise Gay is a 46 y.o. female who presents with ongoing pain in the neck with arm symptoms as noted above.  MRI reveals SCC at C3/4 and C4/5  Patient has failed multiple forms of conservative care and continues to have pain (see office notes for additional details regarding the patient's full course of treatment)  Past Medical History:  Diagnosis Date  . Asthma    as a child  . Diabetes mellitus without complication ([place])    was before gastric bypass but patient is not taking any medications for that  . Gastric bypass status for obesity   . History of staph infection    from previous lumbar back surgery  . Hypertension   . Mononucleosis   . Sleep apnea    before gastric bypass no longer wearing CPAP  . Spinal stenosis of lumbar region 01/18/2016   Past Surgical History:  Procedure Laterality Date  . BREAST REDUCTION SURGERY    . CERVICAL LAMINECTOMY    . GASTRIC BYPASS  08/2011  . lumber surgery     Social History   Social History  . Marital status: Married    Spouse name: N/A  . Number of children: N/A  . Years of education: N/A   Social History Main Topics  . Smoking status: Former Smoker    Quit date: 12/18/2005  . Smokeless tobacco: Never Used  . Alcohol use No  . Drug use: No  . Sexual activity: Not Asked   Other Topics Concern  . None   Social History Narrative  . None   Family History  Problem Relation Age of Onset  . Diabetes Mother   . Hypertension Father   . Diabetes Brother   . Diabetes Maternal Grandmother    No Known Allergies Prior to Admission medications   Medication Sig Start Date End Date Taking? Authorizing Provider  Biotin 5000 MCG SUBL Place 1 tablet under the tongue daily.   Yes [provider]  ibuprofen (ADVIL,MOTRIN) 800 MG tablet Take 1 tablet (800 mg total) by mouth every 8 (eight) hours as needed. 08/04/16  Yes  Silverio Decamp, MD  losartan (COZAAR) 100 MG tablet Take 1 tablet (100 mg total) by mouth daily. 08/04/16  Yes Silverio Decamp, MD  Multiple Vitamins-Minerals (MULTIVITAMIN WITH MINERALS) tablet Take 1 tablet by mouth daily.   Yes [provider]  oxyCODONE-acetaminophen (PERCOCET) 7.5-325 MG tablet Take 1 tablet by mouth every 6 (six) hours as needed for severe pain.   Yes [provider]  PARoxetine (PAXIL) 30 MG tablet Take 30 mg by mouth daily.   Yes [provider]  topiramate (TOPAMAX) 50 MG tablet Take 100 mg by mouth at bedtime.   Yes [provider]     All other systems have been reviewed and were otherwise negative with the exception of those mentioned in the HPI and as above.  Physical Exam: Vitals:   05/13/17 0640  BP: (!) 157/87  Pulse: 74  Resp: 18  Temp: 98.3 F (36.8 C)    General: Alert, no acute distress Cardiovascular: No pedal edema Respiratory: No cyanosis, no use of accessory musculature Skin: No lesions in the area of chief complaint Neurologic: Sensation intact distally Psychiatric: Patient is competent for consent with normal mood and affect Lymphatic: No axillary or cervical lymphadenopathy   Assessment/Plan: Cervical myelopathy  Plan for Procedure(s): ANTERIOR CERVICAL DECOMPRESSION FUSION, CERVICAL  3-4, CERVICAL 4-5 WITH INSTRUMENTATION AND ALLOGRAFT; HARDWARE REMOVAL AT CERVICAL 5-6, CERVICAL 6-7.   Sinclair Ship, MD 05/13/2017 7:07 AM

## 2017-05-13 NOTE — Anesthesia Procedure Notes (Signed)
Procedure Name: Intubation Date/Time: 05/13/2017 7:41 AM Performed by: Carney Living Pre-anesthesia Checklist: Patient identified, Emergency Drugs available, Suction available, Patient being monitored and Timeout performed Patient Re-evaluated:Patient Re-evaluated prior to induction Oxygen Delivery Method: Circle system utilized Preoxygenation: Pre-oxygenation with 100% oxygen Induction Type: IV induction Ventilation: Mask ventilation without difficulty Laryngoscope Size: Glidescope and 4 Grade View: Grade I Tube size: 7.5 mm Number of attempts: 1 Airway Equipment and Method: Stylet and Video-laryngoscopy Placement Confirmation: ETT inserted through vocal cords under direct vision,  positive ETCO2 and breath sounds checked- equal and bilateral Secured at: 22 cm Tube secured with: Tape Dental Injury: Teeth and Oropharynx as per pre-operative assessment  Comments: Pt head remained neutral throughout DL and ETT placement, Airway as above, VSS

## 2017-05-14 ENCOUNTER — Encounter (HOSPITAL_COMMUNITY): Payer: Self-pay | Admitting: Orthopedic Surgery

## 2017-05-14 DIAGNOSIS — M5001 Cervical disc disorder with myelopathy,  high cervical region: Secondary | ICD-10-CM | POA: Diagnosis not present

## 2017-05-14 LAB — GLUCOSE, CAPILLARY: Glucose-Capillary: 100 mg/dL — ABNORMAL HIGH (ref 65–99)

## 2017-05-14 MED FILL — Thrombin For Soln 20000 Unit: CUTANEOUS | Qty: 1 | Status: AC

## 2017-05-14 NOTE — Progress Notes (Signed)
Pt doing well. Pt and husband given D/C instructions with Rx's, verbal understanding was provided. Pt's incision is clean and dry with no sign of infection. Pt's IV was removed prior to D/C. Pt has Aspen collar and Philly collar @ bedside for home. Pt D/C'd home home via wheelchair @ 0945 per MD order. Pt is stable @ D/C and has no other needs at this time. Holli Humbles, RN

## 2017-05-14 NOTE — Progress Notes (Signed)
    Patient doing well Minimal neck discomfort Has been eating and drinking well Has been ambulating   Physical Exam: Vitals:   05/13/17 1951 05/13/17 2311  BP: (!) 155/91 (!) 142/87  Pulse: 74 72  Resp: 20 20  Temp: 97.9 F (36.6 C) 98 F (36.7 C)    Dressing in place NVI  POD #1 s/p C3-5 ACDF, doing well  - encourage ambulation - Percocet for pain, Valium for muscle spasms - likely d/c home later today with f/u in 2 weeks

## 2017-05-14 NOTE — Discharge Instructions (Signed)
Anterior Cervical Diskectomy and Fusion, Care After °Refer to this sheet in the next few weeks. These instructions provide you with information about caring for yourself after your procedure. Your health care provider may also give you more specific instructions. Your treatment has been planned according to current medical practices, but problems sometimes occur. Call your health care provider if you have any problems or questions after your procedure. °What can I expect after the procedure? °After the procedure, it is common to have: °· Neck pain. °· Discomfort when swallowing. °· Slight hoarseness. ° °Follow these instructions at home: °If You Have a Neck Brace: °· Wear it as told by your health care provider. Remove it only as told by your health care provider. °· Keep the brace clean and dry. °Incision care ° °· Follow instructions from your health care provider about how to take care of the cut made during surgery (incision). Make sure you: °? Wash your hands with soap and water before you change your bandage (dressing). If soap and water are not available, use hand sanitizer. °? Change your dressing as told by your health care provider. °? Leave stitches (sutures), skin glue, or adhesive strips in place. These skin closures may need to stay in place for 2 weeks or longer. If adhesive strip edges start to loosen and curl up, you may trim the loose edges. Do not remove adhesive strips completely unless your health care provider tells you to do that. °· Check your incision every day for signs of infection. Watch for: °? Redness, swelling, or pain. °? Fluid or blood. °? Warmth. °? Pus or a bad smell. °Managing pain, stiffness, and swelling °· Take over-the-counter and prescription medicines only as told by your health care provider. °· If directed, apply ice to the injured area. °? Put ice in a plastic bag. °? Place a towel between your skin and the bag. °? Leave the ice on for 20 minutes, 2-3 times per  day. °Activity °· Return to your normal activities as told by your health care provider. Ask your health care provider what activities are safe for you. °· Do exercises as told by your health care provider. °· Do not lift anything that is heavier than 10 lb (4.5 kg). °General instructions °· Do not drive or operate heavy machinery while taking prescription pain medicines. °· Do not use any tobacco products, including cigarettes, chewing tobacco, or e-cigarettes. Tobacco can delay healing. If you need help quitting, ask your health care provider. °· Keep all follow-up visits and physical therapy appointments as told by your health care provider. This is important. °Contact a health care provider if: °· You have a fever. °· You have redness, swelling, or pain around your incision. °· You have fluid or blood coming from your incision. °· You have pus or a bad smell coming from your incision. °· You have pain that is not controlled by your pain medicine. °· You have increasing hoarseness or trouble swallowing. °Get help right away if: °· You have severe pain. °· You have sudden numbness or weakness in your arms. °· You have warmth, tenderness, or swelling in your calf. °· You have chest pain. °· You have difficulty breathing. °This information is not intended to replace advice given to you by your health care provider. Make sure you discuss any questions you have with your health care provider. °Document Released: 11/02/2015 Document Revised: 03/13/2016 Document Reviewed: 09/20/2015 °Elsevier Interactive Patient Education © 2018 Else[employer] Inc[employer] ° °

## 2017-05-14 NOTE — Progress Notes (Signed)
Orthopedic Tech Progress Note Patient Details:  Denise Gay [id number]  Ortho Devices Type of Ortho Device: [place] cervical collar Ortho Device/Splint Location: neck Ortho Device/Splint Interventions: Loanne Drilling, Kris Burd 05/14/2017, 10:49 AM

## 2017-05-27 NOTE — Discharge Summary (Signed)
Patient ID: Denise Gay MRN: [id number] DOB/AGE: [birth year] 48 y.o.  Admit date: 05/13/2017 Discharge date: 05/14/2017  Admission Diagnoses:  Active Problems:   Radiculopathy   Discharge Diagnoses:  Same  Past Medical History:  Diagnosis Date  . Asthma    as a child  . Diabetes mellitus without complication ([place])    was before gastric bypass but patient is not taking any medications for that  . Gastric bypass status for obesity   . History of staph infection    from previous lumbar back surgery  . Hypertension   . Mononucleosis   . Sleep apnea    before gastric bypass no longer wearing CPAP  . Spinal stenosis of lumbar region 01/18/2016    Surgeries: Procedure(s): ANTERIOR CERVICAL DECOMPRESSION FUSION, CERVICAL 3-4, CERVICAL 4-5 WITH INSTRUMENTATION AND ALLOGRAFT; HARDWARE REMOVAL AT CERVICAL 5-6, CERVICAL 6-7. on 05/13/2017   Consultants:   Discharged Condition: Improved  Hospital Course: Denise Gay is an 46 y.o. female who was admitted 05/13/2017 for operative treatment of myeloradiculopathy. Patient has severe unremitting pain that affects sleep, daily activities, and work/hobbies. After pre-op clearance the patient was taken to the operating room on 05/13/2017 and underwent  Procedure(s): ANTERIOR CERVICAL DECOMPRESSION FUSION, CERVICAL 3-4, CERVICAL 4-5 WITH INSTRUMENTATION AND ALLOGRAFT; HARDWARE REMOVAL AT CERVICAL 5-6, CERVICAL 6-7.Marland Kitchen    Patient was given perioperative antibiotics:  Anti-infectives    Start     Dose/Rate Route Frequency Ordered Stop   05/13/17 1600  ceFAZolin (ANCEF) IVPB 2g/100 mL premix     2 g 200 mL/hr over 30 Minutes Intravenous Every 8 hours 05/13/17 1134 05/14/17 0019   05/13/17 0547  ceFAZolin (ANCEF) 2-4 GM/100ML-% IVPB    Comments:  Tamsen Snider   : cabinet override      05/13/17 0547 05/13/17 0752   05/13/17 0546  ceFAZolin (ANCEF) IVPB 2g/100 mL premix     2 g 200 mL/hr over 30 Minutes Intravenous On call to O.R. 05/13/17  4481 05/13/17 8563       Patient was given sequential compression devices, early ambulation to prevent DVT.  Patient benefited maximally from hospital stay and there were no complications.    Recent vital signs: BP (!) 155/88 (BP Location: Right Arm)   Pulse 81   Temp 98 F (36.7 C) (Oral)   Resp 20   SpO2 98%     Discharge Medications:   Allergies as of 05/14/2017   No Known Allergies     Medication List    TAKE these medications   Biotin 5000 MCG Subl Place 1 tablet under the tongue daily.   losartan 100 MG tablet Commonly known as:  COZAAR Take 1 tablet (100 mg total) by mouth daily.   multivitamin with minerals tablet Take 1 tablet by mouth daily.   oxyCODONE-acetaminophen 7.5-325 MG tablet Commonly known as:  PERCOCET Take 1 tablet by mouth every 6 (six) hours as needed for severe pain.   PARoxetine 30 MG tablet Commonly known as:  PAXIL Take 30 mg by mouth daily.   topiramate 50 MG tablet Commonly known as:  TOPAMAX Take 100 mg by mouth at bedtime.       Diagnostic Studies: Dg Chest 2 View  Result Date: 05/08/2017 CLINICAL DATA:  Spinal surgery.  Preadmission exam . EXAM: CHEST  2 VIEW COMPARISON:  No recent prior . FINDINGS: Mediastinum and hilar structures normal. Lungs are clear. Heart size normal. No pleural effusion or pneumothorax. Degenerative changes thoracic spine. Prior cervicothoracic spine fusion .  IMPRESSION: No acute cardiopulmonary disease. Electronically Signed   By: Marcello Moores  Register   On: 05/08/2017 09:29   Dg Cervical Spine 1 View  Result Date: 05/13/2017 CLINICAL DATA:  Cervical spine fusion. EXAM: DG CERVICAL SPINE - 1 VIEW; DG C-ARM 61-120 MIN COMPARISON:  Cervical spine MRI 01/23/2016 FLUOROSCOPY TIME:  C-arm fluoroscopic images were obtained intraoperatively and submitted for post operative interpretation. Please see the performing provider's procedural report for the fluoroscopy time utilized. FINDINGS: Two intraoperative spot  fluoroscopic images of the cervical spine in the lateral projection are provided. The first image demonstrates interval placement of interbody fusion devices at C3-4 and C4-5. The second image demonstrates placement of an anterior fusion plate and screws from C3-C5. The lower cervical spine is obscured by the patient's shoulders. An endotracheal tube is partially visualized. IMPRESSION: Intraoperative images during C3-C5 ACDF. Electronically Signed   By: Logan Bores M.D.   On: 05/13/2017 10:54   Dg C-arm 1-60 Min  Result Date: 05/13/2017 CLINICAL DATA:  Cervical spine fusion. EXAM: DG CERVICAL SPINE - 1 VIEW; DG C-ARM 61-120 MIN COMPARISON:  Cervical spine MRI 01/23/2016 FLUOROSCOPY TIME:  C-arm fluoroscopic images were obtained intraoperatively and submitted for post operative interpretation. Please see the performing provider's procedural report for the fluoroscopy time utilized. FINDINGS: Two intraoperative spot fluoroscopic images of the cervical spine in the lateral projection are provided. The first image demonstrates interval placement of interbody fusion devices at C3-4 and C4-5. The second image demonstrates placement of an anterior fusion plate and screws from C3-C5. The lower cervical spine is obscured by the patient's shoulders. An endotracheal tube is partially visualized. IMPRESSION: Intraoperative images during C3-C5 ACDF. Electronically Signed   By: Logan Bores M.D.   On: 05/13/2017 10:54    Disposition: 01-Home or Self Care   POD #1 s/p C3-5 ACDF, doing well  - encourage ambulation - Percocet for pain, Valium for muscle spasms -Written scripts for pain signed and in chart -D/C instructions sheet printed and in chart -D/C today  -F/U in office 2 weeks   Signed: Justice Britain 05/27/2017, 11:07 AM

## 2017-06-26 DIAGNOSIS — G959 Disease of spinal cord, unspecified: Secondary | ICD-10-CM | POA: Diagnosis not present

## 2017-07-01 DIAGNOSIS — M25511 Pain in right shoulder: Secondary | ICD-10-CM | POA: Diagnosis not present

## 2017-07-01 DIAGNOSIS — M6281 Muscle weakness (generalized): Secondary | ICD-10-CM | POA: Diagnosis not present

## 2017-07-01 DIAGNOSIS — M542 Cervicalgia: Secondary | ICD-10-CM | POA: Diagnosis not present

## 2017-07-02 DIAGNOSIS — M25511 Pain in right shoulder: Secondary | ICD-10-CM | POA: Diagnosis not present

## 2017-07-02 DIAGNOSIS — M6281 Muscle weakness (generalized): Secondary | ICD-10-CM | POA: Diagnosis not present

## 2017-07-02 DIAGNOSIS — M542 Cervicalgia: Secondary | ICD-10-CM | POA: Diagnosis not present

## 2017-07-06 DIAGNOSIS — M6281 Muscle weakness (generalized): Secondary | ICD-10-CM | POA: Diagnosis not present

## 2017-07-06 DIAGNOSIS — M542 Cervicalgia: Secondary | ICD-10-CM | POA: Diagnosis not present

## 2017-07-06 DIAGNOSIS — M25511 Pain in right shoulder: Secondary | ICD-10-CM | POA: Diagnosis not present

## 2017-07-13 DIAGNOSIS — M25511 Pain in right shoulder: Secondary | ICD-10-CM | POA: Diagnosis not present

## 2017-07-13 DIAGNOSIS — M542 Cervicalgia: Secondary | ICD-10-CM | POA: Diagnosis not present

## 2017-07-13 DIAGNOSIS — M6281 Muscle weakness (generalized): Secondary | ICD-10-CM | POA: Diagnosis not present

## 2017-07-17 DIAGNOSIS — M25511 Pain in right shoulder: Secondary | ICD-10-CM | POA: Diagnosis not present

## 2017-07-17 DIAGNOSIS — M6281 Muscle weakness (generalized): Secondary | ICD-10-CM | POA: Diagnosis not present

## 2017-07-17 DIAGNOSIS — M542 Cervicalgia: Secondary | ICD-10-CM | POA: Diagnosis not present

## 2017-07-20 DIAGNOSIS — M542 Cervicalgia: Secondary | ICD-10-CM | POA: Diagnosis not present

## 2017-07-20 DIAGNOSIS — M6281 Muscle weakness (generalized): Secondary | ICD-10-CM | POA: Diagnosis not present

## 2017-07-20 DIAGNOSIS — M25511 Pain in right shoulder: Secondary | ICD-10-CM | POA: Diagnosis not present

## 2017-07-22 DIAGNOSIS — M542 Cervicalgia: Secondary | ICD-10-CM | POA: Diagnosis not present

## 2017-07-22 DIAGNOSIS — M25511 Pain in right shoulder: Secondary | ICD-10-CM | POA: Diagnosis not present

## 2017-07-22 DIAGNOSIS — M6281 Muscle weakness (generalized): Secondary | ICD-10-CM | POA: Diagnosis not present

## 2017-07-24 DIAGNOSIS — M542 Cervicalgia: Secondary | ICD-10-CM | POA: Diagnosis not present

## 2017-07-24 DIAGNOSIS — M6281 Muscle weakness (generalized): Secondary | ICD-10-CM | POA: Diagnosis not present

## 2017-07-27 DIAGNOSIS — M6281 Muscle weakness (generalized): Secondary | ICD-10-CM | POA: Diagnosis not present

## 2017-07-27 DIAGNOSIS — M542 Cervicalgia: Secondary | ICD-10-CM | POA: Diagnosis not present

## 2017-07-27 DIAGNOSIS — M25511 Pain in right shoulder: Secondary | ICD-10-CM | POA: Diagnosis not present

## 2017-07-29 DIAGNOSIS — M6281 Muscle weakness (generalized): Secondary | ICD-10-CM | POA: Diagnosis not present

## 2017-07-29 DIAGNOSIS — M542 Cervicalgia: Secondary | ICD-10-CM | POA: Diagnosis not present

## 2017-07-29 DIAGNOSIS — M25511 Pain in right shoulder: Secondary | ICD-10-CM | POA: Diagnosis not present

## 2017-08-03 DIAGNOSIS — M542 Cervicalgia: Secondary | ICD-10-CM | POA: Diagnosis not present

## 2017-08-03 DIAGNOSIS — M25511 Pain in right shoulder: Secondary | ICD-10-CM | POA: Diagnosis not present

## 2017-08-03 DIAGNOSIS — M6281 Muscle weakness (generalized): Secondary | ICD-10-CM | POA: Diagnosis not present

## 2017-08-07 DIAGNOSIS — M542 Cervicalgia: Secondary | ICD-10-CM | POA: Diagnosis not present

## 2017-08-07 DIAGNOSIS — Z9889 Other specified postprocedural states: Secondary | ICD-10-CM | POA: Diagnosis not present

## 2017-08-18 DIAGNOSIS — M5033 Other cervical disc degeneration, cervicothoracic region: Secondary | ICD-10-CM | POA: Diagnosis not present

## 2017-08-18 DIAGNOSIS — M9971 Connective tissue and disc stenosis of intervertebral foramina of cervical region: Secondary | ICD-10-CM | POA: Diagnosis not present

## 2017-08-18 DIAGNOSIS — M75100 Unspecified rotator cuff tear or rupture of unspecified shoulder, not specified as traumatic: Secondary | ICD-10-CM | POA: Diagnosis not present

## 2017-08-18 DIAGNOSIS — M5020 Other cervical disc displacement, unspecified cervical region: Secondary | ICD-10-CM | POA: Diagnosis not present

## 2017-08-18 DIAGNOSIS — Z981 Arthrodesis status: Secondary | ICD-10-CM | POA: Diagnosis not present

## 2017-08-18 DIAGNOSIS — M542 Cervicalgia: Secondary | ICD-10-CM | POA: Diagnosis not present

## 2017-08-18 DIAGNOSIS — M7581 Other shoulder lesions, right shoulder: Secondary | ICD-10-CM | POA: Diagnosis not present

## 2017-08-31 DIAGNOSIS — M542 Cervicalgia: Secondary | ICD-10-CM | POA: Diagnosis not present

## 2017-08-31 DIAGNOSIS — Z981 Arthrodesis status: Secondary | ICD-10-CM | POA: Diagnosis not present

## 2017-08-31 DIAGNOSIS — M67911 Unspecified disorder of synovium and tendon, right shoulder: Secondary | ICD-10-CM | POA: Diagnosis not present

## 2017-08-31 DIAGNOSIS — M25511 Pain in right shoulder: Secondary | ICD-10-CM | POA: Diagnosis not present

## 2017-08-31 DIAGNOSIS — M4712 Other spondylosis with myelopathy, cervical region: Secondary | ICD-10-CM | POA: Diagnosis not present

## 2017-08-31 DIAGNOSIS — M5030 Other cervical disc degeneration, unspecified cervical region: Secondary | ICD-10-CM | POA: Diagnosis not present

## 2017-10-14 DIAGNOSIS — M7918 Myalgia, other site: Secondary | ICD-10-CM | POA: Diagnosis not present

## 2017-11-06 DIAGNOSIS — M542 Cervicalgia: Secondary | ICD-10-CM | POA: Diagnosis not present

## 2017-11-13 DIAGNOSIS — F331 Major depressive disorder, recurrent, moderate: Secondary | ICD-10-CM | POA: Diagnosis not present

## 2017-12-24 DIAGNOSIS — M7918 Myalgia, other site: Secondary | ICD-10-CM | POA: Diagnosis not present

## 2017-12-24 DIAGNOSIS — Z981 Arthrodesis status: Secondary | ICD-10-CM | POA: Diagnosis not present

## 2017-12-24 DIAGNOSIS — G894 Chronic pain syndrome: Secondary | ICD-10-CM | POA: Diagnosis not present

## 2017-12-24 DIAGNOSIS — I10 Essential (primary) hypertension: Secondary | ICD-10-CM | POA: Diagnosis not present

## 2018-01-01 DIAGNOSIS — F331 Major depressive disorder, recurrent, moderate: Secondary | ICD-10-CM | POA: Diagnosis not present

## 2018-01-01 DIAGNOSIS — I10 Essential (primary) hypertension: Secondary | ICD-10-CM | POA: Diagnosis not present

## 2018-03-02 DIAGNOSIS — F4310 Post-traumatic stress disorder, unspecified: Secondary | ICD-10-CM | POA: Diagnosis not present

## 2018-03-02 DIAGNOSIS — F330 Major depressive disorder, recurrent, mild: Secondary | ICD-10-CM | POA: Diagnosis not present

## 2018-03-02 DIAGNOSIS — F4323 Adjustment disorder with mixed anxiety and depressed mood: Secondary | ICD-10-CM | POA: Diagnosis not present

## 2018-03-05 DIAGNOSIS — M7918 Myalgia, other site: Secondary | ICD-10-CM | POA: Diagnosis not present

## 2018-03-05 DIAGNOSIS — M47812 Spondylosis without myelopathy or radiculopathy, cervical region: Secondary | ICD-10-CM | POA: Diagnosis not present

## 2018-03-05 DIAGNOSIS — M5030 Other cervical disc degeneration, unspecified cervical region: Secondary | ICD-10-CM | POA: Diagnosis not present

## 2018-03-05 DIAGNOSIS — G8929 Other chronic pain: Secondary | ICD-10-CM | POA: Diagnosis not present

## 2018-03-05 DIAGNOSIS — I10 Essential (primary) hypertension: Secondary | ICD-10-CM | POA: Diagnosis not present

## 2018-03-26 DIAGNOSIS — M7918 Myalgia, other site: Secondary | ICD-10-CM | POA: Diagnosis not present

## 2018-03-26 DIAGNOSIS — M47812 Spondylosis without myelopathy or radiculopathy, cervical region: Secondary | ICD-10-CM | POA: Diagnosis not present

## 2018-03-26 DIAGNOSIS — G894 Chronic pain syndrome: Secondary | ICD-10-CM | POA: Diagnosis not present

## 2018-03-26 DIAGNOSIS — I10 Essential (primary) hypertension: Secondary | ICD-10-CM | POA: Diagnosis not present

## 2018-03-26 DIAGNOSIS — G8929 Other chronic pain: Secondary | ICD-10-CM | POA: Diagnosis not present

## 2018-04-02 DIAGNOSIS — Z13220 Encounter for screening for lipoid disorders: Secondary | ICD-10-CM | POA: Diagnosis not present

## 2018-04-02 DIAGNOSIS — J301 Allergic rhinitis due to pollen: Secondary | ICD-10-CM | POA: Diagnosis not present

## 2018-04-02 DIAGNOSIS — I10 Essential (primary) hypertension: Secondary | ICD-10-CM | POA: Diagnosis not present

## 2018-05-07 DIAGNOSIS — G8929 Other chronic pain: Secondary | ICD-10-CM | POA: Diagnosis not present

## 2018-05-07 DIAGNOSIS — M7918 Myalgia, other site: Secondary | ICD-10-CM | POA: Diagnosis not present

## 2018-05-27 DIAGNOSIS — F4310 Post-traumatic stress disorder, unspecified: Secondary | ICD-10-CM | POA: Diagnosis not present

## 2018-05-27 DIAGNOSIS — F330 Major depressive disorder, recurrent, mild: Secondary | ICD-10-CM | POA: Diagnosis not present

## 2018-05-27 DIAGNOSIS — I10 Essential (primary) hypertension: Secondary | ICD-10-CM | POA: Diagnosis not present

## 2018-06-04 DIAGNOSIS — I10 Essential (primary) hypertension: Secondary | ICD-10-CM | POA: Diagnosis not present

## 2018-06-04 DIAGNOSIS — R1084 Generalized abdominal pain: Secondary | ICD-10-CM | POA: Diagnosis not present

## 2018-06-04 LAB — BASIC METABOLIC PANEL
BUN: 14 (ref 4–21)
Creatinine: 0.6 (ref 0.5–1.1)
Glucose: 100
Potassium: 4.3 (ref 3.4–5.3)
Sodium: 141 (ref 137–147)

## 2018-06-04 LAB — HEPATIC FUNCTION PANEL
ALT: 22 (ref 7–35)
AST: 21 (ref 13–35)
Alkaline Phosphatase: 57 (ref 25–125)
Bilirubin, Total: 0.5

## 2018-09-24 DIAGNOSIS — G894 Chronic pain syndrome: Secondary | ICD-10-CM | POA: Diagnosis not present

## 2018-09-24 DIAGNOSIS — M4712 Other spondylosis with myelopathy, cervical region: Secondary | ICD-10-CM | POA: Diagnosis not present

## 2018-09-24 DIAGNOSIS — G8929 Other chronic pain: Secondary | ICD-10-CM | POA: Diagnosis not present

## 2018-09-24 DIAGNOSIS — M7918 Myalgia, other site: Secondary | ICD-10-CM | POA: Diagnosis not present

## 2018-11-01 ENCOUNTER — Encounter: Payer: Self-pay | Admitting: Family Medicine

## 2018-11-01 ENCOUNTER — Ambulatory Visit (INDEPENDENT_AMBULATORY_CARE_PROVIDER_SITE_OTHER): Payer: BLUE CROSS/BLUE SHIELD | Admitting: Family Medicine

## 2018-11-01 VITALS — BP 139/79 | HR 76 | Ht 67.0 in | Wt 183.0 lb

## 2018-11-01 DIAGNOSIS — M7502 Adhesive capsulitis of left shoulder: Secondary | ICD-10-CM | POA: Diagnosis not present

## 2018-11-01 DIAGNOSIS — M25512 Pain in left shoulder: Secondary | ICD-10-CM

## 2018-11-01 NOTE — Patient Instructions (Addendum)
Thank you for coming in today. Call or go to the ER if you develop a large red swollen joint with extreme pain or oozing puss.  Attend Physical therapy.  Recheck in about 1 month with Dr T or myself.  I think you may have frozen shoulder.    Adhesive Capsulitis  Adhesive capsulitis, also called frozen shoulder, causes the shoulder to become stiff and painful to move. This condition happens when there is inflammation of the tendons and ligaments that surround the shoulder joint (shoulder capsule). What are the causes? This condition may be caused by:  An injury to your shoulder joint.  Straining your shoulder.  Not moving your shoulder for a period of time. This can happen if your arm was injured or in a sling.  Long-standing conditions, such as: ? Diabetes. ? Thyroid problems. ? Heart disease. ? Stroke. ? Rheumatoid arthritis. ? Lung disease. In some cases, the cause is not known. What increases the risk? You are more likely to develop this condition if you are:  A woman.  Older than 50 years of age. What are the signs or symptoms? Symptoms of this condition include:  Pain in your shoulder when you move your arm. There may also be pain when parts of your shoulder are touched. The pain may be worse at night or when you are resting.  A sore or aching shoulder.  The inability to move your shoulder normally.  Muscle spasms. How is this diagnosed? This condition is diagnosed with a physical exam and imaging tests, such as an X-ray or MRI. How is this treated? This condition may be treated with:  Treatment of the underlying cause or condition.  Medicine. Medicine may be given to relieve pain, inflammation, or muscle spasms.  Steroid injections into the shoulder joint.  Physical therapy. This involves performing exercises to get the shoulder moving again.  Acupuncture. This is a type of treatment that involves stimulating specific points on your body by inserting thin  needles through your skin.  Shoulder manipulation. This is a procedure to move the shoulder into another position. It is done after you are given a medicine to make you fall asleep (general anesthetic). The joint may also be injected with salt water at high pressure to break down scarring.  Surgery. This may be done in severe cases when other treatments have failed. Although most people recover completely from adhesive capsulitis, some may not regain full shoulder movement. Follow these instructions at home: Managing pain, stiffness, and swelling      If directed, put ice on the injured area: ? Put ice in a plastic bag. ? Place a towel between your skin and the bag. ? Leave the ice on for 20 minutes, 2-3 times per day.  If directed, apply heat to the affected area before you exercise. Use the heat source that your health care provider recommends, such as a moist heat pack or a heating pad. ? Place a towel between your skin and the heat source. ? Leave the heat on for 20-30 minutes. ? Remove the heat if your skin turns bright red. This is especially important if you are unable to feel pain, heat, or cold. You may have a greater risk of getting burned. General instructions  Take over-the-counter and prescription medicines only as told by your health care provider.  If you are being treated with physical therapy, follow instructions from your physical therapist.  Avoid exercises that put a lot of demand on your shoulder, such  as throwing. These exercises can make pain worse.  Keep all follow-up visits as told by your health care provider. This is important. Contact a health care provider if:  You develop new symptoms.  Your symptoms get worse. Summary  Adhesive capsulitis, also called frozen shoulder, causes the shoulder to become stiff and painful to move.  You are more likely to have this condition if you are a woman and over age 38.  It is treated with physical therapy,  medicines, and sometimes surgery. This information is not intended to replace advice given to you by your health care provider. Make sure you discuss any questions you have with your health care provider. Document Released: 08/03/2009 Document Revised: 03/12/2018 Document Reviewed: 03/12/2018 Elsevier Interactive Patient Education  2019 [employer].

## 2018-11-02 NOTE — Progress Notes (Signed)
Denise Gay is a 48 y.o. female who presents to [facility] today for left shoulder pain.  Denise Gay has a history of left shoulder rotator cuff tear status post physical therapy.  She is had some imaging evaluation treatment and management of this about a year and a half ago.  She was doing pretty well with only minimal pain until about a month ago when she developed worsening pain in her left shoulder associated with decreased range of motion.  She notes this worsened again about a week ago.  She cannot recall any specific injury recently.  She denies any fevers chills nausea vomiting or diarrhea.  She notes pain especially with abduction and internal rotation.  Symptoms are moderate she is having difficulty with typical household tasks including dressing grooming and cleaning.  She denies any pain radiating to the hand.  She is tried some over-the-counter medications for pain which have helped a little.    ROS:  As above  Exam:  BP 139/79   Pulse 76   Ht 5\' 7"  (1.702 m)   Wt 183 lb (83 kg)   BMI 28.66 kg/m  Wt Readings from Last 5 Encounters:  11/01/18 183 lb (83 kg)  05/08/17 205 lb 9 oz (93.2 kg)  09/01/16 207 lb 4.8 oz (94 kg)  08/04/16 208 lb (94.3 kg)  04/28/16 204 lb 11.2 oz (92.9 kg)   General: Well Developed, well nourished, and in no acute distress.  Neuro/Psych: Alert and oriented x3, extra-ocular muscles intact, able to move all 4 extremities, sensation grossly intact. Skin: Warm and dry, no rashes noted.  Respiratory: Not using accessory muscles, speaking in full sentences, trachea midline.  Cardiovascular: Pulses palpable, no extremity edema. Abdomen: Does not appear distended. MSK:  C-spine nontender to midline normal neck motion.  Left shoulder normal-appearing nontender. Range of motion external rotation 45 degrees beyond the neutral position. Abduction limited to 110 degrees active and passive. Internal rotation limited  to lumbar spine. Strength is intact but painful especially with abduction and external rotation. Positive Hawkins and Neer's test. Positive empty can test. Negative Yergason's and speeds test.  Right shoulder normal-appearing nontender normal motion normal strength negative impingement testing.    Lab and Radiology Results Procedure: Real-time Ultrasound Guided Injection of left subacromial space Device: GE Logiq E   Images permanently stored and available for review in the ultrasound unit. Verbal informed consent obtained.  Discussed risks and benefits of procedure. Warned about infection bleeding damage to structures skin hypopigmentation and fat atrophy among others. Patient expresses understanding and agreement Time-out conducted.   Noted no overlying erythema, induration, or other signs of local infection.   Skin prepped in a sterile fashion.   Local anesthesia: Topical Ethyl chloride.   With sterile technique and under real time ultrasound guidance:  40 mg of Kenalog and 2 mL of Marcaine injected easily.   Completed without difficulty   Pain did not immediately resolved suggesting alternative location of pain generator . advised to call if fevers/chills, erythema, induration, drainage, or persistent bleeding.   Images permanently stored and available for review in the ultrasound unit.  Impression: Technically successful ultrasound guided injection.        Assessment and Plan: 48 y.o. female with  Left shoulder pain.  Patient does have some limitation in range of motion.  Given her lack of complete response to subacromial bursa injection I think it is possible that her pain generator is glenohumeral.  I think it  is possible she may have early stages of adhesive capsulitis.  Plan to attend physical therapy and work on home exercise program.  I am hopeful that the steroid component of the injection today may also be beneficial.  Follow-up in about a month or so with myself or  her primary care provider Dr. Dianah Field.  Return sooner if needed.    Orders Placed This Encounter  Procedures  . Ambulatory referral to Physical Therapy    Referral Priority:   Routine    Referral Type:   Physical Medicine    Referral Reason:   Specialty Services Required    Requested Specialty:   Physical Therapy   No orders of the defined types were placed in this encounter.   Historical information moved to improve visibility of documentation.  Past Medical History:  Diagnosis Date  . Asthma    as a child  . Diabetes mellitus without complication ([place])    was before gastric bypass but patient is not taking any medications for that  . Gastric bypass status for obesity   . History of staph infection    from previous lumbar back surgery  . Hypertension   . Mononucleosis   . Sleep apnea    before gastric bypass no longer wearing CPAP  . Spinal stenosis of lumbar region 01/18/2016   Past Surgical History:  Procedure Laterality Date  . ANTERIOR CERVICAL DECOMP/DISCECTOMY FUSION N/A 05/13/2017   Procedure: ANTERIOR CERVICAL DECOMPRESSION FUSION, CERVICAL 3-4, CERVICAL 4-5 WITH INSTRUMENTATION AND ALLOGRAFT; HARDWARE REMOVAL AT CERVICAL 5-6, CERVICAL 6-7.;  Surgeon: Phylliss Bob, MD;  Location: [facility];  Service: Orthopedics;  Laterality: N/A;  ANTERIOR CERVICAL DECOMPRESSION FUSION, CERVICAL 3-4, CERVICAL 4-5 WITH INSTRUMENTATION AND ALLOGRAFT; HARDWARE REMOVAL AT CERVICAL 5-6, CERVICAL 6-  . BREAST REDUCTION SURGERY    . CERVICAL LAMINECTOMY    . GASTRIC BYPASS  08/2011  . lumber surgery     Social History   Tobacco Use  . Smoking status: Former Smoker    Last attempt to quit: 12/18/2005    Years since quitting: 12.8  . Smokeless tobacco: Never Used  Substance Use Topics  . Alcohol use: No    Alcohol/week: 0.0 standard drinks   family history includes Diabetes in her brother, maternal grandmother, and mother; Hypertension in her father.  Medications: Current  Outpatient Medications  Medication Sig Dispense Refill  . Biotin 5000 MCG SUBL Place 1 tablet under the tongue daily.    Marland Kitchen losartan (COZAAR) 100 MG tablet Take 1 tablet (100 mg total) by mouth daily. 90 tablet 3  . Multiple Vitamins-Minerals (MULTIVITAMIN WITH MINERALS) tablet Take 1 tablet by mouth daily.    Marland Kitchen oxyCODONE-acetaminophen (PERCOCET) 7.5-325 MG tablet Take 1 tablet by mouth every 6 (six) hours as needed for severe pain.    Marland Kitchen PARoxetine (PAXIL) 30 MG tablet Take 30 mg by mouth daily.    Marland Kitchen topiramate (TOPAMAX) 50 MG tablet Take 100 mg by mouth at bedtime.     No current facility-administered medications for this visit.    No Known Allergies    Discussed warning signs or symptoms. Please see discharge instructions. Patient expresses understanding.

## 2018-11-04 ENCOUNTER — Other Ambulatory Visit: Payer: Self-pay

## 2018-11-04 ENCOUNTER — Encounter: Payer: Self-pay | Admitting: Physical Therapy

## 2018-11-04 ENCOUNTER — Ambulatory Visit: Payer: BLUE CROSS/BLUE SHIELD | Admitting: Physical Therapy

## 2018-11-04 DIAGNOSIS — M25612 Stiffness of left shoulder, not elsewhere classified: Secondary | ICD-10-CM | POA: Diagnosis not present

## 2018-11-04 DIAGNOSIS — R293 Abnormal posture: Secondary | ICD-10-CM | POA: Diagnosis not present

## 2018-11-04 DIAGNOSIS — M25512 Pain in left shoulder: Secondary | ICD-10-CM

## 2018-11-04 NOTE — Patient Instructions (Signed)
Access Code: [email or web address]  URL: [email or web address]  Date: 11/04/2018  Prepared by: Faustino Congress   Exercises  Doorway Pec Stretch at 60 Elevation - 3 reps - 1 sets - 30 sec hold - 2x daily - 7x weekly  Standing Scapular Retraction - 10 reps - 1 sets - 5 sec hold - 2x daily - 7x weekly  Shoulder External Rotation and Scapular Retraction - 10 reps - 1 sets - 5 sec hold - 1x daily - 7x weekly  Standing Backward Shoulder Rolls - 10 reps - 1 sets - 2x daily - 7x weekly

## 2018-11-04 NOTE — Therapy (Addendum)
[place], [place], [address] Phone: [phone]   Fax:  [phone]  Physical Therapy Evaluation  Patient Details  Name: Denise Gay MRN: [id number] Date of Birth: 02-20-1971 Referring Provider (PT): Dr. Lynne Leader   Encounter Date: 11/04/2018  PT End of Session - 11/04/18 1451    Visit Number  1    Number of Visits  12    Date for PT Re-Evaluation  12/16/18    Authorization Type  BC[employer]; 90 visit limit    PT Start Time  1400    Activity Tolerance  Patient tolerated treatment well    Behavior During Therapy  [facility] for tasks assessed/performed      Addendum: Time Out: 3009; total time 56 min Laureen Abrahams, PT, DPT 11/08/18 2:39 PM   Past Medical History:  Diagnosis Date  . Asthma    as a child  . Diabetes mellitus without complication ([place])    was before gastric bypass but patient is not taking any medications for that  . Gastric bypass status for obesity   . History of staph infection    from previous lumbar back surgery  . Hypertension   . Mononucleosis   . Sleep apnea    before gastric bypass no longer wearing CPAP  . Spinal stenosis of lumbar region 01/18/2016    Past Surgical History:  Procedure Laterality Date  . ANTERIOR CERVICAL DECOMP/DISCECTOMY FUSION N/A 05/13/2017   Procedure: ANTERIOR CERVICAL DECOMPRESSION FUSION, CERVICAL 3-4, CERVICAL 4-5 WITH INSTRUMENTATION AND ALLOGRAFT; HARDWARE REMOVAL AT CERVICAL 5-6, CERVICAL 6-7.;  Surgeon: Phylliss Bob, MD;  Location: [place];  Service: Orthopedics;  Laterality: N/A;  ANTERIOR CERVICAL DECOMPRESSION FUSION, CERVICAL 3-4, CERVICAL 4-5 WITH INSTRUMENTATION AND ALLOGRAFT; HARDWARE REMOVAL AT CERVICAL 5-6, CERVICAL 6-  . BREAST REDUCTION SURGERY    . CERVICAL LAMINECTOMY    . GASTRIC BYPASS  08/2011  . lumber surgery      There were no vitals filed for this visit.   Subjective Assessment - 11/04/18 1403    Subjective   Pt is a 48 y/o female who presents to OPPT for Lt shoulder pain.  Pt reports pain x 2 weeks with no known injury, but possibly due to recent travel and carrying/lifting luggage on plane.  Pt with known Lt RTC tear treated conservatively.  Pt had injection on 11/01/18 with mild improvement in symptoms.    Pertinent History  multiple orthopedic surgeries including back and neck    Limitations  Lifting    Patient Stated Goals  improve pain, "figure out what's going on" and determine if surgery is indicated    Currently in Pain?  Yes    Pain Score  6    up to 10/10; at best 3/10   Pain Location  Shoulder    Pain Orientation  Left    Pain Descriptors / Indicators  Sharp;Shooting   "it feels like it's stuck"   Pain Type  Acute pain    Pain Onset  1 to 4 weeks ago    Pain Frequency  Constant    Aggravating Factors   abduction, external rotation most aggravating but movement in general    Pain Relieving Factors  injection, heat    Effect of Pain on Daily Activities  dressing, sleeping         [facility] PT Assessment - 11/04/18 1408      Assessment   Medical Diagnosis  Lt shoulder pain; adhesive capsulitis  Referring Provider (PT)  Dr. Lynne Leader    Onset Date/Surgical Date  10/19/18    Hand Dominance  Left    Next MD Visit  1 month    Prior Therapy  Lt shoulder rehab at [facility] ("never again")      Precautions   Precautions  None      Restrictions   Weight Bearing Restrictions  No      Balance Screen   Has the patient fallen in the past 6 months  No    Has the patient had a decrease in activity level because of a fear of falling?   No    Is the patient reluctant to leave their home because of a fear of falling?   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 [employer])      Cognition   Overall Cognitive Status  Within Functional Limits for tasks assessed      Observation/Other Assessments   Focus on Therapeutic Outcomes (FOTO)   44 (56% limited; predicted 33% limited)      Posture/Postural Control   Posture/Postural Control  Postural limitations    Postural Limitations  Rounded Shoulders;Forward head      ROM / Strength   AROM / PROM / Strength  AROM;PROM;Strength      AROM   Overall AROM Comments  flexion and abduction measured in sitting    AROM Assessment Site  Shoulder    Right/Left Shoulder  Right;Left    Right Shoulder Flexion  140 Degrees    Right Shoulder ABduction  130 Degrees    Left Shoulder Flexion  130 Degrees    Left Shoulder ABduction  110 Degrees    Left Shoulder Internal Rotation  90 Degrees   unable for functional internal rotation   Left Shoulder External Rotation  42 Degrees      PROM   PROM Assessment Site  Shoulder    Right/Left Shoulder  Left    Left Shoulder Flexion  143 Degrees    Left Shoulder ABduction  123 Degrees    Left Shoulder External Rotation  57 Degrees      Strength   Strength Assessment Site  Shoulder    Right/Left Shoulder  Left    Left Shoulder Flexion  3-/5    Left Shoulder ABduction  3-/5    Left Shoulder Internal Rotation  4/5    Left Shoulder External Rotation  3+/5      Palpation   Palpation comment  tightness and trigger points noted in pecs and upper trap on Lt      Special Tests    Special Tests  Rotator Cuff Impingement    Rotator Cuff Impingment tests  Michel Bickers test;Empty Can test;Full Can test      Hawkins-Kennedy test   Findings  Positive    Side  Left      Empty Can test   Findings  Positive    Side  Left      Full Can test  Findings  Positive    Side  Left                 Objective measurements completed on examination: See above findings.      [facility] Adult PT Treatment/Exercise - 11/04/18 1408      Exercises   Exercises  Shoulder      Shoulder Exercises: Standing   External Rotation  Both;5 reps    External Rotation Limitations  standing against noodle; 5 sec hold    Retraction  Both;5 reps    Retraction Limitations  standing against noodle; 5 sec hold    Other Standing Exercises  backwards shoulder rolls x 10 reps      Shoulder Exercises: Stretch   Other Shoulder Stretches  low doorway stretch x 30 sec             PT Education - 11/04/18 1451    Education Details  HEP    Person(s) Educated  Patient    Methods  Explanation;Demonstration;Handout    Comprehension  Verbalized understanding;Returned demonstration;Need further instruction          PT Long Term Goals - 11/04/18 1455      PT LONG TERM GOAL #1   Title  independent with HEP    Status  New    Target Date  12/16/18      PT LONG TERM GOAL #2   Title  improve Lt shoulder flexion and abduction to at least 130 degrees active for improved function.    Status  New    Target Date  12/16/18      PT LONG TERM GOAL #3   Title  FOTO score improved to </= 33% limited for improved function    Status  New    Target Date  12/16/18      PT LONG TERM GOAL #4   Title  report ability to perform UB dressing independently without pain for improved function    Status  New    Target Date  12/16/18      PT LONG TERM GOAL #5   Title  n/a             Plan - 11/04/18 1452    Clinical Impression Statement  Pt is a 48 y/o female who presents to OPPT for Lt shoulder pain.  Pt demonstrates decreased ROM, poor postural awareness, decreased strength and active trigger points affecting functional mobility.  Pt will benefit from PT to address deficits listed.     History and Personal Factors relevant to plan of care:  multiple orthopedic surgeries    Clinical  Presentation  Evolving    Clinical Presentation due to:  sudden onset of pain without known injury    Clinical Decision Making  Moderate    Rehab Potential  Good    PT Frequency  2x / week    PT Duration  6 weeks    PT Treatment/Interventions  ADLs/Self Care Home Management;Cryotherapy;Electrical Stimulation;Iontophoresis 4mg /ml Dexamethasone;Moist Heat;Ultrasound;Functional mobility training;Therapeutic activities;Therapeutic exercise;Manual techniques;Patient/family education;Passive range of motion;Dry needling;Vasopneumatic Device;Taping    PT Next Visit Plan  review HEP, progress as able; manual/modalities/DN/taping (for posture).  functional internal rotation stretch    PT Home Exercise Plan  Access Code: [email or web address]    Consulted and Agree with Plan of Care  Patient       Patient will benefit from skilled therapeutic intervention in order to improve the following deficits and impairments:  Increased fascial restricitons, Increased muscle spasms, Postural dysfunction, Pain, Decreased  range of motion, Decreased strength, Impaired UE functional use  Visit Diagnosis: Acute pain of left shoulder - Plan: PT plan of care cert/re-cert  Stiffness of left shoulder, not elsewhere classified - Plan: PT plan of care cert/re-cert  Abnormal posture - Plan: PT plan of care cert/re-cert     Problem List Patient Active Problem List   Diagnosis Date Noted  . Radiculopathy 05/13/2017  . Otitis media 09/01/2016  . Obesity 03/10/2016  . Spinal stenosis of lumbar region 01/18/2016  . Progressive focal motor weakness 01/15/2016  . H/O C5-C7 ACDF with adjacent level disease 04/30/2015  . Annual physical exam 04/30/2015  . History of Roux-en-Y gastric bypass 11/28/2014  . Essential hypertension, benign 11/28/2014      Laureen Abrahams, PT, DPT 11/04/18 3:01 PM    [facility] [place], [place], [address] Phone:  [phone]   Fax:  [phone]  Name: Denise Gay MRN: [id number] Date of Birth: 04-22-1971

## 2018-11-08 ENCOUNTER — Encounter: Payer: Self-pay | Admitting: Physical Therapy

## 2018-11-08 ENCOUNTER — Ambulatory Visit: Payer: BLUE CROSS/BLUE SHIELD | Admitting: Physical Therapy

## 2018-11-08 DIAGNOSIS — R293 Abnormal posture: Secondary | ICD-10-CM

## 2018-11-08 DIAGNOSIS — M25612 Stiffness of left shoulder, not elsewhere classified: Secondary | ICD-10-CM | POA: Diagnosis not present

## 2018-11-08 DIAGNOSIS — M25512 Pain in left shoulder: Secondary | ICD-10-CM

## 2018-11-08 NOTE — Patient Instructions (Signed)
Access Code: [email or web address]  URL: [email or web address]  Date: 11/08/2018  Prepared by: Faustino Congress   Exercises  Doorway Pec Stretch at 60 Elevation - 3 reps - 1 sets - 30 sec hold - 2x daily - 7x weekly  Standing Scapular Retraction - 10 reps - 1 sets - 5 sec hold - 2x daily - 7x weekly  Shoulder External Rotation and Scapular Retraction - 10 reps - 1 sets - 5 sec hold - 1x daily - 7x weekly  Standing Backward Shoulder Rolls - 10 reps - 1 sets - 2x daily - 7x weekly  Standing Shoulder Internal Rotation Stretch with Towel - 10 reps - 1 sets - 10 sec hold - 2x daily - 7x weekly

## 2018-11-08 NOTE — Therapy (Addendum)
[place], [place], [address] Phone: [phone]   Fax:  [phone]  Physical Therapy Treatment/Discharge  Patient Details  Name: Denise Gay MRN: [id number] Date of Birth: 1970/10/22 Referring Provider (PT): Dr. Lynne Leader   Encounter Date: 11/08/2018  PT End of Session - 11/08/18 0842    Visit Number  2    Number of Visits  12    Date for PT Re-Evaluation  12/16/18    Authorization Type  BCBS; 90 visit limit    PT Start Time  0802    PT Stop Time  0840    PT Time Calculation (min)  38 min    Activity Tolerance  Patient tolerated treatment well    Behavior During Therapy  [facility] for tasks assessed/performed       Past Medical History:  Diagnosis Date  . Asthma    as a child  . Diabetes mellitus without complication ([place])    was before gastric bypass but patient is not taking any medications for that  . Gastric bypass status for obesity   . History of staph infection    from previous lumbar back surgery  . Hypertension   . Mononucleosis   . Sleep apnea    before gastric bypass no longer wearing CPAP  . Spinal stenosis of lumbar region 01/18/2016    Past Surgical History:  Procedure Laterality Date  . ANTERIOR CERVICAL DECOMP/DISCECTOMY FUSION N/A 05/13/2017   Procedure: ANTERIOR CERVICAL DECOMPRESSION FUSION, CERVICAL 3-4, CERVICAL 4-5 WITH INSTRUMENTATION AND ALLOGRAFT; HARDWARE REMOVAL AT CERVICAL 5-6, CERVICAL 6-7.;  Surgeon: Phylliss Bob, MD;  Location: [facility];  Service: Orthopedics;  Laterality: N/A;  ANTERIOR CERVICAL DECOMPRESSION FUSION, CERVICAL 3-4, CERVICAL 4-5 WITH INSTRUMENTATION AND ALLOGRAFT; HARDWARE REMOVAL AT CERVICAL 5-6, CERVICAL 6-  . BREAST REDUCTION SURGERY    . CERVICAL LAMINECTOMY    . GASTRIC BYPASS  08/2011  . lumber surgery      There were no vitals filed for this visit.  Subjective Assessment - 11/08/18 0803    Subjective  Lt shoulder feels "a little  better."  still pretty sore    Patient Stated Goals  improve pain, "figure out what's going on" and determine if surgery is indicated    Currently in Pain?  Yes    Pain Score  4     Pain Location  Shoulder    Pain Orientation  Left    Pain Descriptors / Indicators  Sore    Pain Type  Acute pain    Pain Onset  1 to 4 weeks ago    Pain Frequency  Constant    Aggravating Factors   abduction, external rotation most aggravating but movement in general    Pain Relieving Factors  injection, heat    Effect of Pain on Daily Activities  dressing, sleeping         [facility] PT Assessment - 11/08/18 0806      Assessment   Medical Diagnosis  Lt shoulder pain; adhesive capsulitis    Referring Provider (PT)  Dr. Lynne Leader    Onset Date/Surgical Date  10/19/18    Hand Dominance  Left    Next MD Visit  1 month                   [facility] Adult PT Treatment/Exercise - 11/08/18 0805      Shoulder Exercises: Standing   External Rotation  Both;15 reps    External Rotation Limitations  standing against noodle; 5 sec hold    Extension  Both;15 reps;Theraband    Theraband Level (Shoulder Extension)  Level 2 (Red)    Row  Both;15 reps;Theraband    Theraband Level (Shoulder Row)  Level 2 (Red)    Row Limitations  5 sec hold    Retraction  Both;15 reps    Retraction Limitations  standing against noodle; 5 sec hold    Other Standing Exercises  backwards shoulder rolls x 10 reps      Shoulder Exercises: ROM/Strengthening   UBE (Upper Arm Bike)  L3 x 4 min (2' each direction)      Shoulder Exercises: Stretch   Other Shoulder Stretches  low doorway stretch 3 x 30 sec      Manual Therapy   Manual Therapy  Joint mobilization;Soft tissue mobilization;Taping    Joint Mobilization  Lt shoulder gentle distraction; grades 2-3 A/P and inf glides    Soft tissue mobilization  Lt upper traps and pecs    Kinesiotex  Facilitate Muscle      Kinesiotix   Facilitate Muscle   bil shoulders for posture:  ant shoulder to inferior medial border of scapula with 30% stretch             PT Education - 11/08/18 0842    Education Details  HEP    Person(s) Educated  Patient    Methods  Explanation;Demonstration    Comprehension  Verbalized understanding          PT Long Term Goals - 11/04/18 1455      PT LONG TERM GOAL #1   Title  independent with HEP    Status  New    Target Date  12/16/18      PT LONG TERM GOAL #2   Title  improve Lt shoulder flexion and abduction to at least 130 degrees active for improved function.    Status  New    Target Date  12/16/18      PT LONG TERM GOAL #3   Title  FOTO score improved to </= 33% limited for improved function    Status  New    Target Date  12/16/18      PT LONG TERM GOAL #4   Title  report ability to perform UB dressing independently without pain for improved function    Status  New    Target Date  12/16/18      PT LONG TERM GOAL #5   Title  n/a            Plan - 11/08/18 0842    Clinical Impression Statement  Pt reporting decreased pain today compared to eval and reports improvements in UB dressing.  No goals met as only 2nd visit.    Rehab Potential  Good    PT Frequency  2x / week    PT Duration  6 weeks    PT Treatment/Interventions  ADLs/Self Care Home Management;Cryotherapy;Electrical Stimulation;Iontophoresis 89m/ml Dexamethasone;Moist Heat;Ultrasound;Functional mobility training;Therapeutic activities;Therapeutic exercise;Manual techniques;Patient/family education;Passive range of motion;Dry needling;Vasopneumatic Device;Taping    PT Next Visit Plan  progress as able; manual/modalities/DN/taping (for posture).  assess response to tape    PT Home Exercise Plan  Access Code: [email or web address]   Consulted and Agree with Plan of Care  Patient       Patient will benefit from skilled therapeutic intervention in order to improve the following deficits and impairments:  Increased fascial restricitons, Increased muscle  spasms, Postural dysfunction, Pain, Decreased range of motion,  Decreased strength, Impaired UE functional use  Visit Diagnosis: Acute pain of left shoulder  Stiffness of left shoulder, not elsewhere classified  Abnormal posture     Problem List Patient Active Problem List   Diagnosis Date Noted  . Radiculopathy 05/13/2017  . Otitis media 09/01/2016  . Obesity 03/10/2016  . Spinal stenosis of lumbar region 01/18/2016  . Progressive focal motor weakness 01/15/2016  . H/O C5-C7 ACDF with adjacent level disease 04/30/2015  . Annual physical exam 04/30/2015  . History of Roux-en-Y gastric bypass 11/28/2014  . Essential hypertension, benign 11/28/2014      Laureen Abrahams, PT, DPT 11/08/18 8:44 AM     [facility] [place], [place], [address] Phone: [phone]   Fax:  [phone]  Name: Denise Gay MRN: [id number] Date of Birth: 1971/07/09     PHYSICAL THERAPY DISCHARGE SUMMARY  Visits from Start of Care: 2  Current functional level related to goals / functional outcomes: See above   Remaining deficits: Unknown, pt did not return   Education / Equipment: HEP  Plan: Patient agrees to discharge.  Patient goals were not met. Patient is being discharged due to not returning since the last visit.  ?????     Laureen Abrahams, PT, DPT 12/16/18 1:54 PM  [facility] [place], [place] [address]  [phone] (office) [phone] (fax)

## 2018-11-11 ENCOUNTER — Encounter: Payer: BLUE CROSS/BLUE SHIELD | Admitting: Physical Therapy

## 2018-11-12 DIAGNOSIS — F4323 Adjustment disorder with mixed anxiety and depressed mood: Secondary | ICD-10-CM | POA: Diagnosis not present

## 2018-11-12 DIAGNOSIS — F3342 Major depressive disorder, recurrent, in full remission: Secondary | ICD-10-CM | POA: Diagnosis not present

## 2018-11-12 DIAGNOSIS — I10 Essential (primary) hypertension: Secondary | ICD-10-CM | POA: Diagnosis not present

## 2018-11-12 DIAGNOSIS — F4310 Post-traumatic stress disorder, unspecified: Secondary | ICD-10-CM | POA: Diagnosis not present

## 2018-11-15 ENCOUNTER — Encounter: Payer: BLUE CROSS/BLUE SHIELD | Admitting: Physical Therapy

## 2018-11-19 ENCOUNTER — Encounter: Payer: BLUE CROSS/BLUE SHIELD | Admitting: Physical Therapy

## 2018-11-24 ENCOUNTER — Encounter: Payer: Self-pay | Admitting: Family Medicine

## 2018-11-24 ENCOUNTER — Ambulatory Visit: Payer: BLUE CROSS/BLUE SHIELD | Admitting: Family Medicine

## 2018-11-24 ENCOUNTER — Ambulatory Visit (INDEPENDENT_AMBULATORY_CARE_PROVIDER_SITE_OTHER): Payer: BLUE CROSS/BLUE SHIELD

## 2018-11-24 VITALS — BP 129/71 | HR 70 | Ht 67.0 in | Wt 180.0 lb

## 2018-11-24 DIAGNOSIS — M19012 Primary osteoarthritis, left shoulder: Secondary | ICD-10-CM

## 2018-11-24 DIAGNOSIS — I10 Essential (primary) hypertension: Secondary | ICD-10-CM

## 2018-11-24 DIAGNOSIS — M25512 Pain in left shoulder: Secondary | ICD-10-CM

## 2018-11-24 DIAGNOSIS — M7502 Adhesive capsulitis of left shoulder: Secondary | ICD-10-CM

## 2018-11-24 MED ORDER — LOSARTAN POTASSIUM 100 MG PO TABS: 100.0000 mg | ORAL_TABLET | Freq: Every day | ORAL | 1 refills | Status: DC

## 2018-11-24 NOTE — Progress Notes (Signed)
Denise Gay is a 48 y.o. female who presents to [place] [facility] today for left shoulder pain.  Denise Gay was seen on January 13 for left shoulder pain.  At that visit she was thought to have perhaps rotator cuff tendinopathy.  However she did not have significant immediate benefit from subacromial bursa injection.  There was some concern for glenohumeral cause or potentially adhesive capsulitis.  She has had 2 sessions of physical therapy with moderate improvement in symptoms.  She notes pain located in the left trapezius as well as in the left lateral upper arm.  Pain is worse with shoulder motion.  No radiating pain below the level of the elbow weakness or numbness distally.  Moderate and interfere with normal activities such as cleaning cooking or clothing.    ROS:  As above  Exam:  BP 129/71   Pulse 70   Ht 5\' 7"  (1.702 m)   Wt 180 lb (81.6 kg)   BMI 28.19 kg/m  Wt Readings from Last 5 Encounters:  11/24/18 180 lb (81.6 kg)  11/01/18 183 lb (83 kg)  05/08/17 205 lb 9 oz (93.2 kg)  09/01/16 207 lb 4.8 oz (94 kg)  08/04/16 208 lb (94.3 kg)   General: Well Developed, well nourished, and in no acute distress.  Neuro/Psych: Alert and oriented x3, extra-ocular muscles intact, able to move all 4 extremities, sensation grossly intact. Skin: Warm and dry, no rashes noted.  Respiratory: Not using accessory muscles, speaking in full sentences, trachea midline.  Cardiovascular: Pulses palpable, no extremity edema. Abdomen: Does not appear distended. MSK:  C-spine: Well-appearing scar anterior neck from previous surgery Nontender to spinal midline.  Tender to palpation left trapezius. Cervical motion limited and extension flexion rotation and lateral flexion.  Not particularly painful. Upper extremity strength reflexes and sensation are intact except for shoulder exam listed below  Left shoulder: Normal-appearing.  Tender palpation trapezius.   Nontender otherwise. Range of motion sternal rotation 40 degrees beyond neutral position. Abduction 110 degrees active and passive. Internal rotation limited to lumbar spine. Strength is intact.  Some pain with abduction and external rotation present. Positive Hawkins and Neer's test.  Positive empty cans test.  Negative Yergason's and speeds test.  Right shoulder normal-appearing nontender normal motion normal strength negative impingement testing.    Lab and Radiology Results X-ray left shoulder images personally independently reviewed Mild glenohumeral DJD.  Moderate AC joint DJD.  Calcific changes present at the distal insertion of the supraspinatus tendon on x-ray.  No acute fractures. Await formal radiology review  Procedure: Real-time Ultrasound Guided Injection of left shoulder glenohumeral joint injection Device: GE Logiq E   Images permanently stored and available for review in the ultrasound unit. Verbal informed consent obtained.  Discussed risks and benefits of procedure. Warned about infection bleeding damage to structures skin hypopigmentation and fat atrophy among others. Patient expresses understanding and agreement Time-out conducted.   Noted no overlying erythema, induration, or other signs of local infection.   Skin prepped in a sterile fashion.   Local anesthesia: Topical Ethyl chloride.   With sterile technique and under real time ultrasound guidance:  40 mg of Kenalog and 4 mL of Marcaine injected easily.   Completed without difficulty   Pain partially resolved suggesting accurate placement of the medication.    She continued to have pain in the trapezius as expected. Advised to call if fevers/chills, erythema, induration, drainage, or persistent bleeding.   Images permanently stored and available  for review in the ultrasound unit.  Impression: Technically successful ultrasound guided injection.         Assessment and Plan: 48 y.o. female with left  shoulder pain.  Concern for glenohumeral derived pain based on response to subacromial injection last month.  Patient had moderate improvement immediately following glenohumeral injection.  Adhesive capsulitis is certainly a possibility.  Plan for a bit of watchful waiting following injection along with home exercise program and continued physical therapy.  Additionally patient has trapezius derived pain.  This is likely due to nontypical shoulder motion with glenohumeral joint pain.  Plan for physical therapy as well.  Recheck in about a month.  Additionally blood pressure due for recheck.  Will refill losartan but patient should follow-up with PCP in the near future.  Recent labs August 2019 from outside group abstracted.  Creatinine 0.6.   PDMP not reviewed this encounter. Orders Placed This Encounter  Procedures  . DG Shoulder Left    Standing Status:   Future    Number of Occurrences:   1    Standing Expiration Date:   01/23/2020    Order Specific Question:   Reason for Exam (SYMPTOM  OR DIAGNOSIS REQUIRED)    Answer:   eval left shoulder pain    Order Specific Question:   Is patient pregnant?    Answer:   No    Order Specific Question:   Preferred imaging location?    Answer:   [facility]    Order Specific Question:   Radiology Contrast Protocol - do NOT remove file path    Answer:   \\[email or web address]\DXFluoroContrastProtocols.pdf  . Basic metabolic panel    This external order was created through the Results Console.  . Hepatic function panel    This external order was created through the Results Console.   Meds ordered this encounter  Medications  . losartan (COZAAR) 100 MG tablet    Sig: Take 1 tablet (100 mg total) by mouth daily.    Dispense:  90 tablet    Refill:  1    Historical information moved to improve visibility of documentation.  Past Medical History:  Diagnosis Date  . Asthma    as a child  . Diabetes mellitus without complication  ([place])    was before gastric bypass but patient is not taking any medications for that  . Gastric bypass status for obesity   . History of staph infection    from previous lumbar back surgery  . Hypertension   . Mononucleosis   . Sleep apnea    before gastric bypass no longer wearing CPAP  . Spinal stenosis of lumbar region 01/18/2016   Past Surgical History:  Procedure Laterality Date  . ANTERIOR CERVICAL DECOMP/DISCECTOMY FUSION N/A 05/13/2017   Procedure: ANTERIOR CERVICAL DECOMPRESSION FUSION, CERVICAL 3-4, CERVICAL 4-5 WITH INSTRUMENTATION AND ALLOGRAFT; HARDWARE REMOVAL AT CERVICAL 5-6, CERVICAL 6-7.;  Surgeon: Phylliss Bob, MD;  Location: [facility];  Service: Orthopedics;  Laterality: N/A;  ANTERIOR CERVICAL DECOMPRESSION FUSION, CERVICAL 3-4, CERVICAL 4-5 WITH INSTRUMENTATION AND ALLOGRAFT; HARDWARE REMOVAL AT CERVICAL 5-6, CERVICAL 6-  . BREAST REDUCTION SURGERY    . CERVICAL LAMINECTOMY    . GASTRIC BYPASS  08/2011  . lumber surgery     Social History   Tobacco Use  . Smoking status: Former Smoker    Last attempt to quit: 12/18/2005    Years since quitting: 12.9  . Smokeless tobacco: Never Used  Substance Use Topics  . Alcohol use: No  Alcohol/week: 0.0 standard drinks   family history includes Diabetes in her brother, maternal grandmother, and mother; Hypertension in her father.  Medications: Current Outpatient Medications  Medication Sig Dispense Refill  . Biotin 5000 MCG SUBL Place 1 tablet under the tongue daily.    Marland Kitchen losartan (COZAAR) 100 MG tablet Take 1 tablet (100 mg total) by mouth daily. 90 tablet 1  . Multiple Vitamins-Minerals (MULTIVITAMIN WITH MINERALS) tablet Take 1 tablet by mouth daily.    Marland Kitchen oxyCODONE-acetaminophen (PERCOCET) 7.5-325 MG tablet Take 1 tablet by mouth every 6 (six) hours as needed for severe pain.    Marland Kitchen PARoxetine (PAXIL) 30 MG tablet Take 30 mg by mouth daily.    Marland Kitchen topiramate (TOPAMAX) 50 MG tablet Take 100 mg by mouth at bedtime.     No  current facility-administered medications for this visit.    No Known Allergies    Discussed warning signs or symptoms. Please see discharge instructions. Patient expresses understanding.

## 2018-11-24 NOTE — Patient Instructions (Addendum)
Thank you for coming in today.  Call or go to the ER if you develop a large red swollen joint with extreme pain or oozing puss.   Keep working with PT and range of motion.   Recheck with me or Dr T in 1 month.   Next step is not improving is likely MRI .    Get xray now.    Follow up for well vists in the future with Dr T.

## 2018-12-03 ENCOUNTER — Ambulatory Visit: Payer: Self-pay | Admitting: Family Medicine

## 2018-12-03 DIAGNOSIS — I10 Essential (primary) hypertension: Secondary | ICD-10-CM | POA: Diagnosis not present

## 2018-12-03 DIAGNOSIS — Z9884 Bariatric surgery status: Secondary | ICD-10-CM | POA: Diagnosis not present

## 2018-12-03 DIAGNOSIS — K909 Intestinal malabsorption, unspecified: Secondary | ICD-10-CM | POA: Diagnosis not present

## 2018-12-04 LAB — IRON,TIBC AND FERRITIN PANEL: Ferritin: 48

## 2018-12-04 LAB — VITAMIN B12: Vitamin B-12: 507

## 2018-12-17 DIAGNOSIS — K909 Intestinal malabsorption, unspecified: Secondary | ICD-10-CM | POA: Diagnosis not present

## 2018-12-17 DIAGNOSIS — M85851 Other specified disorders of bone density and structure, right thigh: Secondary | ICD-10-CM | POA: Diagnosis not present

## 2018-12-17 DIAGNOSIS — Z9884 Bariatric surgery status: Secondary | ICD-10-CM | POA: Diagnosis not present

## 2018-12-22 ENCOUNTER — Ambulatory Visit (INDEPENDENT_AMBULATORY_CARE_PROVIDER_SITE_OTHER): Payer: BLUE CROSS/BLUE SHIELD | Admitting: Family Medicine

## 2018-12-22 VITALS — BP 126/68 | HR 78 | Wt 178.0 lb

## 2018-12-22 DIAGNOSIS — M25512 Pain in left shoulder: Secondary | ICD-10-CM | POA: Diagnosis not present

## 2018-12-22 DIAGNOSIS — Z124 Encounter for screening for malignant neoplasm of cervix: Secondary | ICD-10-CM

## 2018-12-22 MED ORDER — LORAZEPAM 0.5 MG PO TABS: ORAL_TABLET | ORAL | 0 refills | Status: DC

## 2018-12-22 NOTE — Progress Notes (Signed)
Denise Gay is a 48 y.o. female who presents to [facility] [place] today for continued left shoulder pain.  Denise Gay has been seen several times for left shoulder pain.  During her first visit with me on January 13 she had pain off and on for over a year and had had some physical therapy.  On her first visit I performed a subacromial injection which provided only partial immediate relief.  She attended physical therapy and was still symptomatic.  Follow-up visit on February 5 proceeded with x-ray which showed calcific tendinitis of the supraspinatus tendon.  Additionally she had a glenohumeral injection for suspicion of early adhesive capsulitis.  That injection helped quite a bit immediately.  She notes that her pain has improved but she still has pain and discomfort in her left shoulder.  She notes pain is predominantly in her left lateral upper arm worse with overhead motion and reaching back.  She has been proceeding with home exercise program as well.  She denies radiating pain weakness or numbness.  She feels well otherwise but notes that her symptoms are moderate and interfering with her normal activities of daily living including getting objects off of high shelves cooking cleaning dressing and exercise.  Additionally she is due for follow-up with PCP as well is due for cervical cancer screening.  She request referral to new OB/GYN.  She prefers to have a female do her cervical cancer screening Pap smear.  ROS as above:  Exam:  BP 126/68   Pulse 78   Wt 178 lb (80.7 kg)   BMI 27.88 kg/m  Wt Readings from Last 5 Encounters:  12/22/18 178 lb (80.7 kg)  11/24/18 180 lb (81.6 kg)  11/01/18 183 lb (83 kg)  05/08/17 205 lb 9 oz (93.2 kg)  09/01/16 207 lb 4.8 oz (94 kg)    Gen: Well NAD HEENT: EOMI,  MMM Lungs: Normal work of breathing. CTABL Heart: RRR no MRG Abd: NABS,  Soft. Nondistended, Nontender Exts: Brisk capillary refill, warm and well perfused.   Left shoulder: Normal-appearing not particularly tender. Abduction range of motion 140 degrees. External rotation range of motion full. Internal range of motion limited to lumbar spine. Positive Hawkins and Neer's test. Mildly positive empty can test. Strength abduction full.  Internal rotation full.  External rotation 4/5. Negative Yergason's and speeds test.  Right shoulder: Normal-appearing nontender normal motion normal strength negative impingement testing.  Lab and Radiology Results Dg Shoulder Left  Result Date: 11/24/2018 CLINICAL DATA:  Acute left shoulder pain without known injury. EXAM: LEFT SHOULDER - 2+ VIEW COMPARISON:  None. FINDINGS: There is no evidence of fracture or dislocation. Mild narrowing of glenohumeral joint is noted calcification is seen over greater tuberosity concerning for calcific tendinosis. Soft tissues are unremarkable. IMPRESSION: Probable calcific tendinosis of supraspinatus tendon. Mild osteoarthritis of left glenohumeral joint. No acute abnormality is noted in the left shoulder. Electronically Signed   By: Marijo Conception, M.D.   On: 11/24/2018 13:00   I personally (independently) visualized and performed the interpretation of the images attached in this note.     Assessment and Plan: 48 y.o. female with left shoulder pain.  Exam is more consistent with rotator cuff tendinopathy then adhesive capsulitis.  However patient had considerable pain relief with glenohumeral injection and not much pain relief with subacromial injection which would support a more interarticular diagnosis.   Given that she is still painful and her  symptoms are still bothering her in her normal activities and interfering with her normal life next step is MRI.  Patient is failed conservative management.  Plan for MRI for surgical planning or for other treatment options.  Ativan prior to MRI for  anxiety.  Discussed need for driver.  Recommend follow-up after MRI.  She could consolidate that visit with follow-up with PCP who is also a sports medicine doctor.  Additionally will refer to OB/GYN for cervical cancer screening  PDMP reviewed during this encounter. Orders Placed This Encounter  Procedures  . MR Shoulder Left Wo Contrast    Standing Status:   Future    Standing Expiration Date:   02/21/2020    Order Specific Question:   What is the patient's sedation requirement?    Answer:   Anti-anxiety    Order Specific Question:   Does the patient have a pacemaker or implanted devices?    Answer:   No    Order Specific Question:   Preferred imaging location?    Answer:   [facility] (table limit-350lbs)    Order Specific Question:   Radiology Contrast Protocol - do NOT remove file path    Answer:   \\[email or web address]\Radiant\[email or web address]  . Ambulatory referral to Obstetrics / Gynecology    Referral Priority:   Routine    Referral Type:   Consultation    Referral Reason:   Specialty Services Required    Requested Specialty:   Obstetrics and Gynecology    Number of Visits Requested:   1   Meds ordered this encounter  Medications  . LORazepam (ATIVAN) 0.5 MG tablet    Sig: 1-2 tabs 30 - 60 min prior to MRI. Do not drive with this medicine.    Dispense:  4 tablet    Refill:  0     Historical information moved to improve visibility of documentation.  Past Medical History:  Diagnosis Date  . Asthma    as a child  . Diabetes mellitus without complication ([place])    was before gastric bypass but patient is not taking any medications for that  . Gastric bypass status for obesity   . History of staph infection    from previous lumbar back surgery  . Hypertension   . Mononucleosis   . Sleep apnea    before gastric bypass no longer wearing CPAP  . Spinal stenosis of lumbar region 01/18/2016   Past Surgical History:  Procedure Laterality Date  . ANTERIOR  CERVICAL DECOMP/DISCECTOMY FUSION N/A 05/13/2017   Procedure: ANTERIOR CERVICAL DECOMPRESSION FUSION, CERVICAL 3-4, CERVICAL 4-5 WITH INSTRUMENTATION AND ALLOGRAFT; HARDWARE REMOVAL AT CERVICAL 5-6, CERVICAL 6-7.;  Surgeon: Phylliss Bob, MD;  Location: [facility];  Service: Orthopedics;  Laterality: N/A;  ANTERIOR CERVICAL DECOMPRESSION FUSION, CERVICAL 3-4, CERVICAL 4-5 WITH INSTRUMENTATION AND ALLOGRAFT; HARDWARE REMOVAL AT CERVICAL 5-6, CERVICAL 6-  . BREAST REDUCTION SURGERY    . CERVICAL LAMINECTOMY    . GASTRIC BYPASS  08/2011  . lumber surgery     Social History   Tobacco Use  . Smoking status: Former Smoker    Last attempt to quit: 12/18/2005    Years since quitting: 13.0  . Smokeless tobacco: Never Used  Substance Use Topics  . Alcohol use: No    Alcohol/week: 0.0 standard drinks   family history includes Diabetes in her brother, maternal grandmother, and mother; Hypertension in her father.  Medications: Current Outpatient Medications  Medication Sig Dispense Refill  . Biotin 5000 MCG SUBL Place 1 tablet under  the tongue daily.    Marland Kitchen losartan (COZAAR) 100 MG tablet Take 1 tablet (100 mg total) by mouth daily. 90 tablet 1  . Multiple Vitamins-Minerals (MULTIVITAMIN WITH MINERALS) tablet Take 1 tablet by mouth daily.    Marland Kitchen PARoxetine (PAXIL) 30 MG tablet Take 30 mg by mouth daily.    Marland Kitchen topiramate (TOPAMAX) 50 MG tablet Take 100 mg by mouth at bedtime.    Marland Kitchen LORazepam (ATIVAN) 0.5 MG tablet 1-2 tabs 30 - 60 min prior to MRI. Do not drive with this medicine. 4 tablet 0   No current facility-administered medications for this visit.    No Known Allergies   Discussed warning signs or symptoms. Please see discharge instructions. Patient expresses understanding.

## 2018-12-22 NOTE — Patient Instructions (Addendum)
Thank you for coming in today. You should hear about MRI soon.  Please schedule a follow up after the MRI to go over results in detail.  Take the ativan 60 mins prior to MRI.  You will need a driver.   You should hear from OBGYN soon about appointment.

## 2018-12-27 ENCOUNTER — Encounter: Payer: Self-pay | Admitting: Sports Medicine

## 2018-12-27 LAB — VITAMIN D 1,25 DIHYDROXY: Vit D, 25-Hydroxy: 33.9

## 2018-12-31 ENCOUNTER — Ambulatory Visit (INDEPENDENT_AMBULATORY_CARE_PROVIDER_SITE_OTHER): Payer: BLUE CROSS/BLUE SHIELD

## 2018-12-31 ENCOUNTER — Other Ambulatory Visit: Payer: Self-pay

## 2018-12-31 VITALS — BP 112/75 | HR 82 | Ht 67.0 in | Wt 182.0 lb

## 2018-12-31 DIAGNOSIS — Z01419 Encounter for gynecological examination (general) (routine) without abnormal findings: Secondary | ICD-10-CM

## 2018-12-31 DIAGNOSIS — Z1151 Encounter for screening for human papillomavirus (HPV): Secondary | ICD-10-CM

## 2018-12-31 DIAGNOSIS — Z124 Encounter for screening for malignant neoplasm of cervix: Secondary | ICD-10-CM | POA: Diagnosis not present

## 2018-12-31 DIAGNOSIS — Z113 Encounter for screening for infections with a predominantly sexual mode of transmission: Secondary | ICD-10-CM

## 2018-12-31 NOTE — Progress Notes (Signed)
Pt is due for mammogram Last pap 2017- pt states it was normal

## 2018-12-31 NOTE — Progress Notes (Signed)
GYNECOLOGY ANNUAL PREVENTATIVE CARE ENCOUNTER NOTE  History:     Denise Gay is a 48 y.o. [id number] female here for a routine annual gynecologic exam.  Current complaints: none.   Denies abnormal vaginal bleeding, discharge, pelvic pain, problems with intercourse or other gynecologic concerns.    Gynecologic History No LMP recorded. Patient has had an ablation. Contraception: none Last Pap: 2017. Results were: patient reports normal with negative HPV Last mammogram: unsure.   Obstetric History OB History  Gravida Para Term Preterm AB Living  2 0     2    SAB TAB Ectopic Multiple Live Births    2          # Outcome Date GA Lbr Len/2nd Weight Sex Delivery Anes PTL Lv  2 TAB           1 TAB             Past Medical History:  Diagnosis Date  . Asthma    as a child  . Diabetes mellitus without complication ([place])    was before gastric bypass but patient is not taking any medications for that  . Gastric bypass status for obesity   . History of staph infection    from previous lumbar back surgery  . Hypertension   . Mononucleosis   . Sleep apnea    before gastric bypass no longer wearing CPAP  . Spinal stenosis of lumbar region 01/18/2016    Past Surgical History:  Procedure Laterality Date  . ANTERIOR CERVICAL DECOMP/DISCECTOMY FUSION N/A 05/13/2017   Procedure: ANTERIOR CERVICAL DECOMPRESSION FUSION, CERVICAL 3-4, CERVICAL 4-5 WITH INSTRUMENTATION AND ALLOGRAFT; HARDWARE REMOVAL AT CERVICAL 5-6, CERVICAL 6-7.;  Surgeon: Phylliss Bob, MD;  Location: [facility];  Service: Orthopedics;  Laterality: N/A;  ANTERIOR CERVICAL DECOMPRESSION FUSION, CERVICAL 3-4, CERVICAL 4-5 WITH INSTRUMENTATION AND ALLOGRAFT; HARDWARE REMOVAL AT CERVICAL 5-6, CERVICAL 6-  . BREAST REDUCTION SURGERY    . CERVICAL LAMINECTOMY    . GASTRIC BYPASS  08/2011  . lumber surgery      Current Outpatient Medications on File Prior to Visit  Medication Sig Dispense Refill  . Biotin 5000 MCG SUBL Place 1  tablet under the tongue daily.    Marland Kitchen LORazepam (ATIVAN) 0.5 MG tablet 1-2 tabs 30 - 60 min prior to MRI. Do not drive with this medicine. 4 tablet 0  . losartan (COZAAR) 100 MG tablet Take 1 tablet (100 mg total) by mouth daily. 90 tablet 1  . Multiple Vitamins-Minerals (MULTIVITAMIN WITH MINERALS) tablet Take 1 tablet by mouth daily.    Marland Kitchen PARoxetine (PAXIL) 30 MG tablet Take 30 mg by mouth daily.    Marland Kitchen topiramate (TOPAMAX) 50 MG tablet Take 100 mg by mouth at bedtime.     No current facility-administered medications on file prior to visit.     No Known Allergies  Social History:  reports that she quit smoking about 13 years ago. She has never used smokeless tobacco. She reports that she does not drink alcohol or use drugs.  Family History  Problem Relation Age of Onset  . Diabetes Mother   . Hypertension Father   . Diabetes Brother   . Diabetes Maternal Grandmother     The following portions of the patient's history were reviewed and updated as appropriate: allergies, current medications, past family history, past medical history, past social history, past surgical history and problem list.  Review of Systems Pertinent items noted in HPI and remainder of comprehensive ROS  otherwise negative.  Physical Exam:  BP 112/75   Pulse 82   Ht 5\' 7"  (1.702 m)   Wt 182 lb (82.6 kg)   BMI 28.51 kg/m  CONSTITUTIONAL: Well-developed, well-nourished female in no acute distress.  HENT:  Normocephalic, atraumatic, External right and left ear normal. Oropharynx is clear and moist EYES: Conjunctivae and EOM are normal. Pupils are equal, round, and reactive to light. No scleral icterus.  NECK: Normal range of motion, supple, no masses.  Normal thyroid.  SKIN: Skin is warm and dry. No rash noted. Not diaphoretic. No erythema. No pallor. MUSCULOSKELETAL: Normal range of motion. No tenderness.  No cyanosis, clubbing, or edema.  2+ distal pulses. NEUROLOGIC: Alert and oriented to person, place, and  time. Normal reflexes, muscle tone coordination. No cranial nerve deficit noted. PSYCHIATRIC: Normal mood and affect. Normal behavior. Normal judgment and thought content. CARDIOVASCULAR: Normal heart rate noted, regular rhythm RESPIRATORY: Clear to auscultation bilaterally. Effort and breath sounds normal, no problems with respiration noted. BREASTS: Symmetric in size. No masses, skin changes, nipple drainage, or lymphadenopathy. ABDOMEN: Soft, normal bowel sounds, no distention noted.  No tenderness, rebound or guarding.  PELVIC: Normal appearing external genitalia; normal appearing vaginal mucosa and cervix.  No abnormal discharge noted.  Pap smear obtained.  Normal uterine size, no other palpable masses, no uterine or adnexal tenderness.   Assessment and Plan:    1. Well woman exam with routine gynecological exam - Pap today - MM Digital Screening; Future  Will follow up results of pap smear and manage accordingly. Mammogram scheduled Routine preventative health maintenance measures emphasized. Please refer to After Visit Summary for other counseling recommendations.   Wende Mott, CNM 12/31/18 9:23 AM

## 2019-01-04 LAB — CYTOLOGY - PAP
Chlamydia: NEGATIVE
Diagnosis: NEGATIVE
HPV: NOT DETECTED
Neisseria Gonorrhea: NEGATIVE

## 2019-01-14 DIAGNOSIS — F4310 Post-traumatic stress disorder, unspecified: Secondary | ICD-10-CM | POA: Diagnosis not present

## 2019-01-14 DIAGNOSIS — F3342 Major depressive disorder, recurrent, in full remission: Secondary | ICD-10-CM | POA: Diagnosis not present

## 2019-01-14 DIAGNOSIS — F5105 Insomnia due to other mental disorder: Secondary | ICD-10-CM | POA: Diagnosis not present

## 2019-01-19 ENCOUNTER — Other Ambulatory Visit: Payer: Self-pay

## 2019-01-19 DIAGNOSIS — Z1231 Encounter for screening mammogram for malignant neoplasm of breast: Secondary | ICD-10-CM

## 2019-02-15 DIAGNOSIS — F4310 Post-traumatic stress disorder, unspecified: Secondary | ICD-10-CM | POA: Diagnosis not present

## 2019-02-23 ENCOUNTER — Ambulatory Visit (INDEPENDENT_AMBULATORY_CARE_PROVIDER_SITE_OTHER): Payer: BLUE CROSS/BLUE SHIELD | Admitting: Sports Medicine

## 2019-02-23 ENCOUNTER — Encounter: Payer: Self-pay | Admitting: Sports Medicine

## 2019-02-23 DIAGNOSIS — Z9889 Other specified postprocedural states: Secondary | ICD-10-CM

## 2019-02-23 DIAGNOSIS — M542 Cervicalgia: Secondary | ICD-10-CM

## 2019-02-23 NOTE — Progress Notes (Signed)
Subjective:    CC: Bilateral neck and shoulder  HPI: This is a pleasant 48 year old female with cervical DDD, she has done well with an epidural in the distant past, and occasional trigger point injection here and there, she is having recurrence of pain over both trapezii, right worse than left, moderate, persistent, localized without radiation, no trauma, no constitutional symptoms, no progressive weakness.  I reviewed the past medical history, family history, social history, surgical history, and allergies today and no changes were needed.  Please see the problem list section below in epic for further details.  Past Medical History: Past Medical History:  Diagnosis Date  . Asthma    as a child  . Diabetes mellitus without complication ([place])    was before gastric bypass but patient is not taking any medications for that  . Gastric bypass status for obesity   . History of staph infection    from previous lumbar back surgery  . Hypertension   . Mononucleosis   . Sleep apnea    before gastric bypass no longer wearing CPAP  . Spinal stenosis of lumbar region 01/18/2016   Past Surgical History: Past Surgical History:  Procedure Laterality Date  . ANTERIOR CERVICAL DECOMP/DISCECTOMY FUSION N/A 05/13/2017   Procedure: ANTERIOR CERVICAL DECOMPRESSION FUSION, CERVICAL 3-4, CERVICAL 4-5 WITH INSTRUMENTATION AND ALLOGRAFT; HARDWARE REMOVAL AT CERVICAL 5-6, CERVICAL 6-7.;  Surgeon: Phylliss Bob, MD;  Location: [facility];  Service: Orthopedics;  Laterality: N/A;  ANTERIOR CERVICAL DECOMPRESSION FUSION, CERVICAL 3-4, CERVICAL 4-5 WITH INSTRUMENTATION AND ALLOGRAFT; HARDWARE REMOVAL AT CERVICAL 5-6, CERVICAL 6-  . BREAST REDUCTION SURGERY    . CERVICAL LAMINECTOMY    . GASTRIC BYPASS  08/2011  . lumber surgery     Social History: Social History   Socioeconomic History  . Marital status: Married    Spouse name: Not on file  . Number of children: Not on file  . Years of education: Not on file   . Highest education level: Not on file  Occupational History  . Not on file  Social Needs  . Financial resource strain: Not on file  . Food insecurity:    Worry: Not on file    Inability: Not on file  . Transportation needs:    Medical: Not on file    Non-medical: Not on file  Tobacco Use  . Smoking status: Former Smoker    Last attempt to quit: 12/18/2005    Years since quitting: 13.1  . Smokeless tobacco: Never Used  Substance and Sexual Activity  . Alcohol use: No    Alcohol/week: 0.0 standard drinks  . Drug use: No  . Sexual activity: Yes  Lifestyle  . Physical activity:    Days per week: Not on file    Minutes per session: Not on file  . Stress: Not on file  Relationships  . Social connections:    Talks on phone: Not on file    Gets together: Not on file    Attends religious service: Not on file    Active member of club or organization: Not on file    Attends meetings of clubs or organizations: Not on file    Relationship status: Not on file  Other Topics Concern  . Not on file  Social History Narrative  . Not on file   Family History: Family History  Problem Relation Age of Onset  . Diabetes Mother   . Hypertension Father   . Diabetes Brother   . Diabetes Maternal Grandmother  Allergies: No Known Allergies Medications: See med rec.  Review of Systems: No fevers, chills, night sweats, weight loss, chest pain, or shortness of breath.   Objective:    General: Well Developed, well nourished, and in no acute distress.  Neuro: Alert and oriented x3, extra-ocular muscles intact, sensation grossly intact.  HEENT: Normocephalic, atraumatic, pupils equal round reactive to light, neck supple, no masses, no lymphadenopathy, thyroid nonpalpable.  Skin: Warm and dry, no rashes. Cardiac: Regular rate and rhythm, no murmurs rubs or gallops, no lower extremity edema.  Respiratory: Clear to auscultation bilaterally. Not using accessory muscles, speaking in full  sentences. All that Negative spurling's Full neck range of motion Grip strength and sensation normal in bilateral hands Strength good C4 to T1 distribution No sensory change to C4 to T1 Reflexes normal Tenderness to palpation over trapezial trigger points on the right and the left.  Procedure:  Injection of #6 bilateral trapezial trigger points Consent obtained and verified. Time-out conducted. Noted no overlying erythema, induration, or other signs of local infection. Skin prepped in a sterile fashion. Topical analgesic spray: Ethyl chloride. Completed without difficulty. Meds: A total of 2 cc Kenalog 40, 3 cc lidocaine, 3 cc bupivacaine spread out between the 6 trigger points Pain immediately improved suggesting accurate placement of the medication. Advised to call if fevers/chills, erythema, induration, drainage, or persistent bleeding.  Impression and Recommendations:    H/O C5-C7 ACDF with adjacent level disease I performed bilateral trigger point injections. I am ordering a new MRI, she did historically have severe spinal stenosis above the level of her ACDF. Severe pain, failure of greater than 6 weeks of physician directed conservative measures.   ___________________________________________ Gwen Her. Dianah Field, M.D., ABFM., CAQSM. Primary Care and Sports Medicine [facility]  Adjunct Professor of [place] of [facility] [employer]

## 2019-02-23 NOTE — Assessment & Plan Note (Signed)
I performed bilateral trigger point injections. I am ordering a new MRI, she did historically have severe spinal stenosis above the level of her ACDF. Severe pain, failure of greater than 6 weeks of physician directed conservative measures.

## 2019-02-24 ENCOUNTER — Telehealth: Payer: Self-pay | Admitting: Sports Medicine

## 2019-02-24 MED ORDER — DIAZEPAM 5 MG PO TABS: ORAL_TABLET | ORAL | 0 refills | Status: DC

## 2019-02-24 NOTE — Telephone Encounter (Signed)
Pt advised.

## 2019-02-24 NOTE — Telephone Encounter (Signed)
Pt needs premeds for MRI due to claustrophobia. Routing.

## 2019-02-24 NOTE — Telephone Encounter (Signed)
Valium sent in for pre-imaging anxiolysis, patient will need a driver if she uses the Valium.

## 2019-02-27 ENCOUNTER — Ambulatory Visit (INDEPENDENT_AMBULATORY_CARE_PROVIDER_SITE_OTHER): Payer: BLUE CROSS/BLUE SHIELD

## 2019-02-27 ENCOUNTER — Other Ambulatory Visit: Payer: Self-pay

## 2019-02-27 DIAGNOSIS — Z9889 Other specified postprocedural states: Secondary | ICD-10-CM | POA: Diagnosis not present

## 2019-02-27 DIAGNOSIS — M542 Cervicalgia: Secondary | ICD-10-CM | POA: Diagnosis not present

## 2019-02-28 ENCOUNTER — Encounter: Payer: Self-pay | Admitting: Sports Medicine

## 2019-02-28 ENCOUNTER — Ambulatory Visit (INDEPENDENT_AMBULATORY_CARE_PROVIDER_SITE_OTHER): Payer: BLUE CROSS/BLUE SHIELD | Admitting: Sports Medicine

## 2019-02-28 DIAGNOSIS — Z9889 Other specified postprocedural states: Secondary | ICD-10-CM

## 2019-02-28 MED ORDER — CELECOXIB 200 MG PO CAPS: ORAL_CAPSULE | ORAL | 2 refills | Status: DC

## 2019-02-28 NOTE — Progress Notes (Signed)
Subjective:    CC: MRI results  HPI: Denise Gay is a pleasant 48 year old female with a history of a C3-C7 fusion, she has persistent neck pain, we tried some trigger point injections without significant relief.  She would like to get back on an NSAID, because pain is moderate, persistent, localized above the trapezii without radiation past the shoulders and her disc retrusion is higher at the top of the construct she is agreeable to try a cervical epidural.  I reviewed the past medical history, family history, social history, surgical history, and allergies today and no changes were needed.  Please see the problem list section below in epic for further details.  Past Medical History: Past Medical History:  Diagnosis Date  . Asthma    as a child  . Diabetes mellitus without complication ([place])    was before gastric bypass but patient is not taking any medications for that  . Gastric bypass status for obesity   . History of staph infection    from previous lumbar back surgery  . Hypertension   . Mononucleosis   . Sleep apnea    before gastric bypass no longer wearing CPAP  . Spinal stenosis of lumbar region 01/18/2016   Past Surgical History: Past Surgical History:  Procedure Laterality Date  . ANTERIOR CERVICAL DECOMP/DISCECTOMY FUSION N/A 05/13/2017   Procedure: ANTERIOR CERVICAL DECOMPRESSION FUSION, CERVICAL 3-4, CERVICAL 4-5 WITH INSTRUMENTATION AND ALLOGRAFT; HARDWARE REMOVAL AT CERVICAL 5-6, CERVICAL 6-7.;  Surgeon: Phylliss Bob, MD;  Location: [facility];  Service: Orthopedics;  Laterality: N/A;  ANTERIOR CERVICAL DECOMPRESSION FUSION, CERVICAL 3-4, CERVICAL 4-5 WITH INSTRUMENTATION AND ALLOGRAFT; HARDWARE REMOVAL AT CERVICAL 5-6, CERVICAL 6-  . BREAST REDUCTION SURGERY    . CERVICAL LAMINECTOMY    . GASTRIC BYPASS  08/2011  . lumber surgery     Social History: Social History   Socioeconomic History  . Marital status: Married    Spouse name: Not on file  . Number of children: Not  on file  . Years of education: Not on file  . Highest education level: Not on file  Occupational History  . Not on file  Social Needs  . Financial resource strain: Not on file  . Food insecurity:    Worry: Not on file    Inability: Not on file  . Transportation needs:    Medical: Not on file    Non-medical: Not on file  Tobacco Use  . Smoking status: Former Smoker    Last attempt to quit: 12/18/2005    Years since quitting: 13.2  . Smokeless tobacco: Never Used  Substance and Sexual Activity  . Alcohol use: No    Alcohol/week: 0.0 standard drinks  . Drug use: No  . Sexual activity: Yes  Lifestyle  . Physical activity:    Days per week: Not on file    Minutes per session: Not on file  . Stress: Not on file  Relationships  . Social connections:    Talks on phone: Not on file    Gets together: Not on file    Attends religious service: Not on file    Active member of club or organization: Not on file    Attends meetings of clubs or organizations: Not on file    Relationship status: Not on file  Other Topics Concern  . Not on file  Social History Narrative  . Not on file   Family History: Family History  Problem Relation Age of Onset  . Diabetes Mother   .  Hypertension Father   . Diabetes Brother   . Diabetes Maternal Grandmother    Allergies: No Known Allergies Medications: See med rec.  Review of Systems: No fevers, chills, night sweats, weight loss, chest pain, or shortness of breath.   Objective:    General: Well Developed, well nourished, and in no acute distress.  Neuro: Alert and oriented x3, extra-ocular muscles intact, sensation grossly intact.  HEENT: Normocephalic, atraumatic, pupils equal round reactive to light, neck supple, no masses, no lymphadenopathy, thyroid nonpalpable.  Skin: Warm and dry, no rashes. Cardiac: Regular rate and rhythm, no murmurs rubs or gallops, no lower extremity edema.  Respiratory: Clear to auscultation bilaterally. Not  using accessory muscles, speaking in full sentences.  Impression and Recommendations:    H/O C5-C7 ACDF with adjacent level disease Doing okay after trigger point injections, new MRI was obtained that shows adjacent level disease. Trigger point injections did not provide sufficient relief so we are going to proceed with a another cervical epidural. Starting Celebrex, discontinue ibuprofen.  I spent 25 minutes with this patient, greater than 50% was face-to-face time counseling regarding the above diagnoses.  ___________________________________________ Gwen Her. Dianah Field, M.D., ABFM., CAQSM. Primary Care and Sports Medicine [facility]  Adjunct Professor of [place] of [facility] of Medicine

## 2019-02-28 NOTE — Assessment & Plan Note (Signed)
Doing okay after trigger point injections, new MRI was obtained that shows adjacent level disease. Trigger point injections did not provide sufficient relief so we are going to proceed with a another cervical epidural. Starting Celebrex, discontinue ibuprofen.

## 2019-03-23 ENCOUNTER — Ambulatory Visit: Payer: BLUE CROSS/BLUE SHIELD

## 2019-03-23 DIAGNOSIS — F4310 Post-traumatic stress disorder, unspecified: Secondary | ICD-10-CM | POA: Diagnosis not present

## 2019-03-23 DIAGNOSIS — F5105 Insomnia due to other mental disorder: Secondary | ICD-10-CM | POA: Diagnosis not present

## 2019-03-23 DIAGNOSIS — F3342 Major depressive disorder, recurrent, in full remission: Secondary | ICD-10-CM | POA: Diagnosis not present

## 2019-03-29 DIAGNOSIS — M9981 Other biomechanical lesions of cervical region: Secondary | ICD-10-CM | POA: Diagnosis not present

## 2019-03-29 DIAGNOSIS — M4722 Other spondylosis with radiculopathy, cervical region: Secondary | ICD-10-CM | POA: Diagnosis not present

## 2019-03-29 DIAGNOSIS — M4712 Other spondylosis with myelopathy, cervical region: Secondary | ICD-10-CM | POA: Diagnosis not present

## 2019-03-29 DIAGNOSIS — M5030 Other cervical disc degeneration, unspecified cervical region: Secondary | ICD-10-CM | POA: Diagnosis not present

## 2019-04-28 ENCOUNTER — Other Ambulatory Visit: Payer: Self-pay | Admitting: Family Medicine

## 2019-04-28 DIAGNOSIS — I10 Essential (primary) hypertension: Secondary | ICD-10-CM

## 2019-05-02 ENCOUNTER — Other Ambulatory Visit: Payer: Self-pay | Admitting: Sports Medicine

## 2019-05-02 DIAGNOSIS — I10 Essential (primary) hypertension: Secondary | ICD-10-CM

## 2019-05-02 MED ORDER — LOSARTAN POTASSIUM 100 MG PO TABS: 100.0000 mg | ORAL_TABLET | Freq: Every day | ORAL | 1 refills | Status: DC

## 2019-05-02 MED FILL — LOSARTAN POTASSIUM 100 MG T: 100 | 30 days supply | Qty: 30 | Fill #0

## 2019-05-02 NOTE — Telephone Encounter (Signed)
Left brief VM that pharmacy has been switched since [facility] did not have the Losartan 100 mg. Patient was asked to call back with any questions.

## 2019-05-24 ENCOUNTER — Ambulatory Visit (INDEPENDENT_AMBULATORY_CARE_PROVIDER_SITE_OTHER): Payer: BC Managed Care – PPO | Admitting: Sports Medicine

## 2019-05-24 ENCOUNTER — Encounter: Payer: Self-pay | Admitting: Sports Medicine

## 2019-05-24 ENCOUNTER — Other Ambulatory Visit: Payer: Self-pay

## 2019-05-24 DIAGNOSIS — Z9889 Other specified postprocedural states: Secondary | ICD-10-CM | POA: Diagnosis not present

## 2019-05-24 MED ORDER — PREGABALIN 75 MG PO CAPS: 75.0000 mg | ORAL_CAPSULE | Freq: Two times a day (BID) | ORAL | 3 refills | Status: DC

## 2019-05-24 MED ORDER — TRAMADOL HCL 50 MG PO TABS
50.0000 mg | ORAL_TABLET | Freq: Three times a day (TID) | ORAL | 0 refills | Status: DC | PRN
Start: 2019-05-24 — End: 2019-07-25

## 2019-05-24 NOTE — Progress Notes (Signed)
Subjective:    CC: Neck pain  HPI: This is a pleasant 48 year old female, she is post C3-C7 cervical fusion, persistent pain, we tried a cervical epidural without any improvement, pain is mild, persistent, localized without radiation.  I reviewed the past medical history, family history, social history, surgical history, and allergies today and no changes were needed.  Please see the problem list section below in epic for further details.  Past Medical History: Past Medical History:  Diagnosis Date  . Asthma    as a child  . Diabetes mellitus without complication ([place])    was before gastric bypass but patient is not taking any medications for that  . Gastric bypass status for obesity   . History of staph infection    from previous lumbar back surgery  . Hypertension   . Mononucleosis   . Sleep apnea    before gastric bypass no longer wearing CPAP  . Spinal stenosis of lumbar region 01/18/2016   Past Surgical History: Past Surgical History:  Procedure Laterality Date  . ANTERIOR CERVICAL DECOMP/DISCECTOMY FUSION N/A 05/13/2017   Procedure: ANTERIOR CERVICAL DECOMPRESSION FUSION, CERVICAL 3-4, CERVICAL 4-5 WITH INSTRUMENTATION AND ALLOGRAFT; HARDWARE REMOVAL AT CERVICAL 5-6, CERVICAL 6-7.;  Surgeon: Phylliss Bob, MD;  Location: [facility];  Service: Orthopedics;  Laterality: N/A;  ANTERIOR CERVICAL DECOMPRESSION FUSION, CERVICAL 3-4, CERVICAL 4-5 WITH INSTRUMENTATION AND ALLOGRAFT; HARDWARE REMOVAL AT CERVICAL 5-6, CERVICAL 6-  . BREAST REDUCTION SURGERY    . CERVICAL LAMINECTOMY    . GASTRIC BYPASS  08/2011  . lumber surgery     Social History: Social History   Socioeconomic History  . Marital status: Married    Spouse name: Not on file  . Number of children: Not on file  . Years of education: Not on file  . Highest education level: Not on file  Occupational History  . Not on file  Social Needs  . Financial resource strain: Not on file  . Food insecurity    Worry: Not on  file    Inability: Not on file  . Transportation needs    Medical: Not on file    Non-medical: Not on file  Tobacco Use  . Smoking status: Former Smoker    Quit date: 12/18/2005    Years since quitting: 13.4  . Smokeless tobacco: Never Used  Substance and Sexual Activity  . Alcohol use: No    Alcohol/week: 0.0 standard drinks  . Drug use: No  . Sexual activity: Yes  Lifestyle  . Physical activity    Days per week: Not on file    Minutes per session: Not on file  . Stress: Not on file  Relationships  . Social Herbalist on phone: Not on file    Gets together: Not on file    Attends religious service: Not on file    Active member of club or organization: Not on file    Attends meetings of clubs or organizations: Not on file    Relationship status: Not on file  Other Topics Concern  . Not on file  Social History Narrative  . Not on file   Family History: Family History  Problem Relation Age of Onset  . Diabetes Mother   . Hypertension Father   . Diabetes Brother   . Diabetes Maternal Grandmother    Allergies: No Known Allergies Medications: See med rec.  Review of Systems: No fevers, chills, night sweats, weight loss, chest pain, or shortness of breath.  Objective:    General: Well Developed, well nourished, and in no acute distress.  Neuro: Alert and oriented x3, extra-ocular muscles intact, sensation grossly intact.  HEENT: Normocephalic, atraumatic, pupils equal round reactive to light, neck supple, no masses, no lymphadenopathy, thyroid nonpalpable.  Skin: Warm and dry, no rashes. Cardiac: Regular rate and rhythm, no murmurs rubs or gallops, no lower extremity edema.  Respiratory: Clear to auscultation bilaterally. Not using accessory muscles, speaking in full sentences.  Impression and Recommendations:    H/O C3-C7 Fusion At this point she is had a C3-C7 fusion with persistent neck pain, cervical epidural was not effective. I think at this point  we have reached maximum improvement, and it really got a boil down to find a good medical regimen for her. I have asked her to go ahead and get another opinion from Dr. Lynann Bologna though I do not think there is another surgical option here. I am going to start Lyrica at 75 mg twice daily as well as tramadol for breakthrough pain. We can do an up taper monthly on Lyrica if needed.   ___________________________________________ Gwen Her. Dianah Field, M.D., ABFM., CAQSM. Primary Care and Sports Medicine [facility]  Adjunct Professor of [place] of [facility] of Medicine

## 2019-05-24 NOTE — Assessment & Plan Note (Signed)
At this point she is had a C3-C7 fusion with persistent neck pain, cervical epidural was not effective. I think at this point we have reached maximum improvement, and it really got a boil down to find a good medical regimen for her. I have asked her to go ahead and get another opinion from Dr. Lynann Bologna though I do not think there is another surgical option here. I am going to start Lyrica at 75 mg twice daily as well as tramadol for breakthrough pain. We can do an up taper monthly on Lyrica if needed.

## 2019-05-28 ENCOUNTER — Other Ambulatory Visit: Payer: Self-pay | Admitting: Sports Medicine

## 2019-05-28 DIAGNOSIS — Z9889 Other specified postprocedural states: Secondary | ICD-10-CM

## 2019-05-31 ENCOUNTER — Ambulatory Visit: Payer: BLUE CROSS/BLUE SHIELD | Admitting: Sports Medicine

## 2019-06-06 DIAGNOSIS — M542 Cervicalgia: Secondary | ICD-10-CM | POA: Diagnosis not present

## 2019-06-10 DIAGNOSIS — F4310 Post-traumatic stress disorder, unspecified: Secondary | ICD-10-CM | POA: Diagnosis not present

## 2019-06-21 ENCOUNTER — Ambulatory Visit: Payer: BC Managed Care – PPO | Admitting: Sports Medicine

## 2019-07-01 ENCOUNTER — Ambulatory Visit (INDEPENDENT_AMBULATORY_CARE_PROVIDER_SITE_OTHER): Payer: BC Managed Care – PPO | Admitting: Sports Medicine

## 2019-07-01 DIAGNOSIS — Z9889 Other specified postprocedural states: Secondary | ICD-10-CM | POA: Diagnosis not present

## 2019-07-01 MED ORDER — PREGABALIN 100 MG PO CAPS: 100.0000 mg | ORAL_CAPSULE | Freq: Two times a day (BID) | ORAL | 3 refills | Status: DC

## 2019-07-01 NOTE — Progress Notes (Signed)
Subjective:    CC: Follow-up  HPI: This is a pleasant 48 year old female, she is post multilevel cervical ACDF with persistent pain, Lyrica 75 did not help, repeat cervical epidural did not help, we did get a second opinion from spine surgery, no additional surgical option, she understands that at this point we need to find a good medical regimen.  I reviewed the past medical history, family history, social history, surgical history, and allergies today and no changes were needed.  Please see the problem list section below in epic for further details.  Past Medical History: Past Medical History:  Diagnosis Date  . Asthma    as a child  . Diabetes mellitus without complication ([place])    was before gastric bypass but patient is not taking any medications for that  . Gastric bypass status for obesity   . History of staph infection    from previous lumbar back surgery  . Hypertension   . Mononucleosis   . Sleep apnea    before gastric bypass no longer wearing CPAP  . Spinal stenosis of lumbar region 01/18/2016   Past Surgical History: Past Surgical History:  Procedure Laterality Date  . ANTERIOR CERVICAL DECOMP/DISCECTOMY FUSION N/A 05/13/2017   Procedure: ANTERIOR CERVICAL DECOMPRESSION FUSION, CERVICAL 3-4, CERVICAL 4-5 WITH INSTRUMENTATION AND ALLOGRAFT; HARDWARE REMOVAL AT CERVICAL 5-6, CERVICAL 6-7.;  Surgeon: Phylliss Bob, MD;  Location: [facility];  Service: Orthopedics;  Laterality: N/A;  ANTERIOR CERVICAL DECOMPRESSION FUSION, CERVICAL 3-4, CERVICAL 4-5 WITH INSTRUMENTATION AND ALLOGRAFT; HARDWARE REMOVAL AT CERVICAL 5-6, CERVICAL 6-  . BREAST REDUCTION SURGERY    . CERVICAL LAMINECTOMY    . GASTRIC BYPASS  08/2011  . lumber surgery     Social History: Social History   Socioeconomic History  . Marital status: Married    Spouse name: Not on file  . Number of children: Not on file  . Years of education: Not on file  . Highest education level: Not on file  Occupational  History  . Not on file  Social Needs  . Financial resource strain: Not on file  . Food insecurity    Worry: Not on file    Inability: Not on file  . Transportation needs    Medical: Not on file    Non-medical: Not on file  Tobacco Use  . Smoking status: Former Smoker    Quit date: 12/18/2005    Years since quitting: 13.5  . Smokeless tobacco: Never Used  Substance and Sexual Activity  . Alcohol use: No    Alcohol/week: 0.0 standard drinks  . Drug use: No  . Sexual activity: Yes  Lifestyle  . Physical activity    Days per week: Not on file    Minutes per session: Not on file  . Stress: Not on file  Relationships  . Social Herbalist on phone: Not on file    Gets together: Not on file    Attends religious service: Not on file    Active member of club or organization: Not on file    Attends meetings of clubs or organizations: Not on file    Relationship status: Not on file  Other Topics Concern  . Not on file  Social History Narrative  . Not on file   Family History: Family History  Problem Relation Age of Onset  . Diabetes Mother   . Hypertension Father   . Diabetes Brother   . Diabetes Maternal Grandmother    Allergies: No Known Allergies  Medications: See med rec.  Review of Systems: No fevers, chills, night sweats, weight loss, chest pain, or shortness of breath.   Objective:    General: Well Developed, well nourished, and in no acute distress.  Neuro: Alert and oriented x3, extra-ocular muscles intact, sensation grossly intact.  HEENT: Normocephalic, atraumatic, pupils equal round reactive to light, neck supple, no masses, no lymphadenopathy, thyroid nonpalpable.  Skin: Warm and dry, no rashes. Cardiac: Regular rate and rhythm, no murmurs rubs or gallops, no lower extremity edema.  Respiratory: Clear to auscultation bilaterally. Not using accessory muscles, speaking in full sentences.  Impression and Recommendations:    H/O C3-C7 Fusion [place] is  post C3-C7 cervical ACDF with persistent neck pain, cervical epidural was ineffective. I think she is reached maximum medical improvement, and we are now needing to find a good medical regimen. She did touch base with Dr. Lynann Bologna who also thinks that there is not another surgical option. Increasing Lyrica to 100 mg twice daily, tramadol for breakthrough pain. She understands that we would do monthly up tapers on Lyrica to a maximum of 600 total milligrams daily. Next visit can be virtual.   ___________________________________________ Gwen Her. Dianah Field, M.D., ABFM., CAQSM. Primary Care and Sports Medicine [place] [facility]  Adjunct Professor of [place] of [facility] of Medicine

## 2019-07-01 NOTE — Assessment & Plan Note (Signed)
Denise Gay is post C3-C7 cervical ACDF with persistent neck pain, cervical epidural was ineffective. I think she is reached maximum medical improvement, and we are now needing to find a good medical regimen. She did touch base with Dr. Lynann Bologna who also thinks that there is not another surgical option. Increasing Lyrica to 100 mg twice daily, tramadol for breakthrough pain. She understands that we would do monthly up tapers on Lyrica to a maximum of 600 total milligrams daily. Next visit can be virtual.

## 2019-07-05 DIAGNOSIS — F4310 Post-traumatic stress disorder, unspecified: Secondary | ICD-10-CM | POA: Diagnosis not present

## 2019-07-21 DIAGNOSIS — F4310 Post-traumatic stress disorder, unspecified: Secondary | ICD-10-CM | POA: Diagnosis not present

## 2019-07-25 ENCOUNTER — Other Ambulatory Visit: Payer: Self-pay | Admitting: Sports Medicine

## 2019-07-25 DIAGNOSIS — Z9889 Other specified postprocedural states: Secondary | ICD-10-CM

## 2019-07-29 ENCOUNTER — Ambulatory Visit: Payer: BC Managed Care – PPO | Admitting: Sports Medicine

## 2019-07-29 ENCOUNTER — Ambulatory Visit (INDEPENDENT_AMBULATORY_CARE_PROVIDER_SITE_OTHER): Payer: BC Managed Care – PPO | Admitting: Sports Medicine

## 2019-07-29 ENCOUNTER — Ambulatory Visit (INDEPENDENT_AMBULATORY_CARE_PROVIDER_SITE_OTHER): Payer: BC Managed Care – PPO

## 2019-07-29 ENCOUNTER — Encounter: Payer: Self-pay | Admitting: Sports Medicine

## 2019-07-29 ENCOUNTER — Other Ambulatory Visit: Payer: Self-pay

## 2019-07-29 DIAGNOSIS — R1011 Right upper quadrant pain: Secondary | ICD-10-CM | POA: Insufficient documentation

## 2019-07-29 DIAGNOSIS — I10 Essential (primary) hypertension: Secondary | ICD-10-CM | POA: Diagnosis not present

## 2019-07-29 DIAGNOSIS — F411 Generalized anxiety disorder: Secondary | ICD-10-CM | POA: Diagnosis not present

## 2019-07-29 DIAGNOSIS — F4310 Post-traumatic stress disorder, unspecified: Secondary | ICD-10-CM | POA: Diagnosis not present

## 2019-07-29 DIAGNOSIS — F5105 Insomnia due to other mental disorder: Secondary | ICD-10-CM | POA: Diagnosis not present

## 2019-07-29 DIAGNOSIS — F3342 Major depressive disorder, recurrent, in full remission: Secondary | ICD-10-CM | POA: Diagnosis not present

## 2019-07-29 NOTE — Assessment & Plan Note (Signed)
Colicky right upper quadrant pain, question biliary colic. CBC, CMP, amylase, lipase, urinalysis. Abdominal ultrasound. If unrevealing we will proceed with HIDA scan.

## 2019-07-29 NOTE — Patient Instructions (Signed)
Biliary Colic, Adult  Biliary colic is severe pain caused by a problem with a small organ in the upper right part of your belly (gallbladder). The gallbladder stores a digestive fluid produced in the liver (bile) that helps the body break down fat. Bile and other digestive enzymes are carried from the liver to the small intestine through tube-like structures (bile ducts). The gallbladder and the bile ducts form the biliary tract. Sometimes hard deposits of digestive fluids form in the gallbladder (gallstones) and block the flow of bile from the gallbladder, causing biliary colic. This condition is also called a gallbladder attack. Gallstones can be as small as a grain of sand or as big as a golf ball. There could be just one gallstone in the gallbladder, or there could be many. What are the causes? Biliary colic is usually caused by gallstones. Less often, a tumor could block the flow of bile from the gallbladder and trigger biliary colic. What increases the risk? This condition is more likely to develop in:  Women.  People of Hispanic descent.  People with a family history of gallstones.  People who are obese.  People who suddenly or quickly lose weight.  People who eat a high-calorie, low-fiber diet that is rich in refined carbs (carbohydrates), such as white bread and white rice.  People who have an intestinal disease that affects nutrient absorption, such as Crohn disease.  People who have a metabolic condition, such as metabolic syndrome or diabetes. What are the signs or symptoms? Severe pain in the upper right side of the belly is the main symptom of biliary colic. You may feel this pain below the chest but above the hip. This pain often occurs at night or after eating a very fatty meal. This pain may get worse for up to an hour and last as long as 12 hours. In most cases, the pain fades (subsides) within a couple hours. Other symptoms of this condition include:  Nausea and  vomiting.  Pain under the right shoulder. How is this diagnosed? This condition is diagnosed based on your medical history, your symptoms, and a physical exam. You may have tests, including:  Blood tests to rule out infection or inflammation of the bile ducts, gallbladder, pancreas, or liver.  Imaging studies such as: ? Ultrasound. ? CT scan. ? MRI. In some cases, you may need to have an imaging study done using a small amount of radioactive material (nuclear medicine) to confirm the diagnosis. How is this treated? Treatment for this condition may include medicine to relieve your pain or nausea. If you have gallstones that are causing biliary colic, you may need surgery to remove the gallbladder (cholecystectomy). Gallstones can also be dissolved gradually with medicine. It may take months or years before the gallstones are completely gone. Follow these instructions at home:  Take over-the-counter and prescription medicines only as told by your health care provider.  Drink enough fluid to keep your urine clear or pale yellow.  Follow instructions from your health care provider about eating or drinking restrictions. These may include avoiding: ? Fatty, greasy, and fried foods. ? Any foods that make the pain worse. ? Overeating. ? Having a large meal after not eating for a while.  Keep all follow-up visits as told by your health care provider. This is important. How is this prevented? Steps to prevent this condition include:  Maintaining a healthy body weight.  Getting regular exercise.  Eating a healthy, high-fiber, low-fat diet.  Limiting how much   sugar and refined carbs you eat, such as sweets, white flour, and white rice. Contact a health care provider if:  Your pain lasts more than 5 hours.  You vomit.  You have a fever and chills.  Your pain gets worse. Get help right away if:  Your skin or the whites of your eyes look yellow (jaundice).  Your have tea-colored  urine and light-colored stools.  You are dizzy or you faint. Summary  Biliary colic is severe pain caused by a problem with a small organ in the upper right part of your belly (gallbladder).  Treatments for this condition include medicines that relieves your pain or nausea and medicines that slowly dissolves the gallstones.  If gallstones cause your biliary colic, the treatment is surgery to remove the gallbladder (cholecystectomy). This information is not intended to replace advice given to you by your health care provider. Make sure you discuss any questions you have with your health care provider. Document Released: 03/09/2006 Document Revised: 09/18/2017 Document Reviewed: 04/21/2016 Elsevier Patient Education  2020 Elsevier Inc.  

## 2019-07-29 NOTE — Progress Notes (Signed)
Subjective:    CC: Abdominal pain  HPI: For the past week or so this pleasant 48 year old female has noted severe on and off, colicky right upper quadrant abdominal pain without radiation, it is not really positional, she has not identified food triggers, no associated nausea, vomiting, fevers, chills, no trauma, no melena, hematochezia, hematemesis.  I reviewed the past medical history, family history, social history, surgical history, and allergies today and no changes were needed.  Please see the problem list section below in epic for further details.  Past Medical History: Past Medical History:  Diagnosis Date  . Asthma    as a child  . Diabetes mellitus without complication ([place])    was before gastric bypass but patient is not taking any medications for that  . Gastric bypass status for obesity   . History of staph infection    from previous lumbar back surgery  . Hypertension   . Mononucleosis   . Sleep apnea    before gastric bypass no longer wearing CPAP  . Spinal stenosis of lumbar region 01/18/2016   Past Surgical History: Past Surgical History:  Procedure Laterality Date  . ANTERIOR CERVICAL DECOMP/DISCECTOMY FUSION N/A 05/13/2017   Procedure: ANTERIOR CERVICAL DECOMPRESSION FUSION, CERVICAL 3-4, CERVICAL 4-5 WITH INSTRUMENTATION AND ALLOGRAFT; HARDWARE REMOVAL AT CERVICAL 5-6, CERVICAL 6-7.;  Surgeon: Phylliss Bob, MD;  Location: [place];  Service: Orthopedics;  Laterality: N/A;  ANTERIOR CERVICAL DECOMPRESSION FUSION, CERVICAL 3-4, CERVICAL 4-5 WITH INSTRUMENTATION AND ALLOGRAFT; HARDWARE REMOVAL AT CERVICAL 5-6, CERVICAL 6-  . BREAST REDUCTION SURGERY    . CERVICAL LAMINECTOMY    . GASTRIC BYPASS  08/2011  . lumber surgery     Social History: Social History   Socioeconomic History  . Marital status: Married    Spouse name: Not on file  . Number of children: Not on file  . Years of education: Not on file  . Highest education level: Not on file  Occupational  History  . Not on file  Social Needs  . Financial resource strain: Not on file  . Food insecurity    Worry: Not on file    Inability: Not on file  . Transportation needs    Medical: Not on file    Non-medical: Not on file  Tobacco Use  . Smoking status: Former Smoker    Quit date: 12/18/2005    Years since quitting: 13.6  . Smokeless tobacco: Never Used  Substance and Sexual Activity  . Alcohol use: No    Alcohol/week: 0.0 standard drinks  . Drug use: No  . Sexual activity: Yes  Lifestyle  . Physical activity    Days per week: Not on file    Minutes per session: Not on file  . Stress: Not on file  Relationships  . Social Herbalist on phone: Not on file    Gets together: Not on file    Attends religious service: Not on file    Active member of club or organization: Not on file    Attends meetings of clubs or organizations: Not on file    Relationship status: Not on file  Other Topics Concern  . Not on file  Social History Narrative  . Not on file   Family History: Family History  Problem Relation Age of Onset  . Diabetes Mother   . Hypertension Father   . Diabetes Brother   . Diabetes Maternal Grandmother    Allergies: No Known Allergies Medications: See med rec.  Review of Systems: No fevers, chills, night sweats, weight loss, chest pain, or shortness of breath.   Objective:    General: Well Developed, well nourished, and in no acute distress.  Neuro: Alert and oriented x3, extra-ocular muscles intact, sensation grossly intact.  HEENT: Normocephalic, atraumatic, pupils equal round reactive to light, neck supple, no masses, no lymphadenopathy, thyroid nonpalpable.  Skin: Warm and dry, no rashes. Cardiac: Regular rate and rhythm, no murmurs rubs or gallops, no lower extremity edema.  Respiratory: Clear to auscultation bilaterally. Not using accessory muscles, speaking in full sentences. Abdomen: Soft, tender to palpation in the right upper quadrant,  positive Murphy sign, nondistended, normal bowel sounds, no palpable masses, no guarding, rigidity, rebound tenderness.  Impression and Recommendations:    Acute abdominal pain in right upper quadrant Colicky right upper quadrant pain, question biliary colic. CBC, CMP, amylase, lipase, urinalysis. Abdominal ultrasound. If unrevealing we will proceed with HIDA scan.   ___________________________________________ Gwen Her. Dianah Field, M.D., ABFM., CAQSM. Primary Care and Sports Medicine [facility]  Adjunct Professor of [place] [employer] [facility] [employer]

## 2019-07-30 LAB — CBC WITH DIFFERENTIAL/PLATELET
Absolute Monocytes: 771 cells/uL (ref 200–950)
Basophils Absolute: 103 {cells}/uL (ref 0–200)
Basophils Relative: 1.1 %
Eosinophils Absolute: 273 {cells}/uL (ref 15–500)
Eosinophils Relative: 2.9 %
HCT: 39 % (ref 35.0–45.0)
Hemoglobin: 12.7 g/dL (ref 11.7–15.5)
Lymphs Abs: 2341 cells/uL (ref 850–3900)
MCH: 29.1 pg (ref 27.0–33.0)
MCHC: 32.6 g/dL (ref 32.0–36.0)
MCV: 89.4 fL (ref 80.0–100.0)
MPV: 11.5 fL (ref 7.5–12.5)
Monocytes Relative: 8.2 %
Neutro Abs: 5913 cells/uL (ref 1500–7800)
Neutrophils Relative %: 62.9 %
Platelets: 213 10*3/uL (ref 140–400)
RBC: 4.36 10*6/uL (ref 3.80–5.10)
RDW: 13 % (ref 11.0–15.0)
Total Lymphocyte: 24.9 %
WBC: 9.4 10*3/uL (ref 3.8–10.8)

## 2019-07-30 LAB — COMPLETE METABOLIC PANEL WITH GFR
AG Ratio: 1.5 (calc) (ref 1.0–2.5)
ALT: 39 U/L — ABNORMAL HIGH (ref 6–29)
AST: 36 U/L — ABNORMAL HIGH (ref 10–35)
Albumin: 4.3 g/dL (ref 3.6–5.1)
Alkaline phosphatase (APISO): 79 U/L (ref 37–153)
BUN/Creatinine Ratio: 33 (calc) — ABNORMAL HIGH (ref 6–22)
BUN: 31 mg/dL — ABNORMAL HIGH (ref 7–25)
CO2: 24 mmol/L (ref 20–32)
Calcium: 9.6 mg/dL (ref 8.6–10.4)
Chloride: 106 mmol/L (ref 98–110)
Creat: 0.94 mg/dL (ref 0.50–1.05)
GFR, Est African American: 82 mL/min/{1.73_m2} (ref >=60)
GFR, Est Non African American: 71 mL/min/{1.73_m2} (ref >=60)
Globulin: 2.8 g/dL (calc) (ref 1.9–3.7)
Glucose, Bld: 94 mg/dL (ref 65–99)
Potassium: 4.9 mmol/L (ref 3.5–5.3)
Sodium: 138 mmol/L (ref 135–146)
Total Bilirubin: 0.2 mg/dL (ref 0.2–1.2)
Total Protein: 7.1 g/dL (ref 6.1–8.1)

## 2019-07-30 LAB — LIPASE: Lipase: 31 U/L (ref 7–60)

## 2019-07-30 LAB — AMYLASE: Amylase: 30 U/L (ref 21–101)

## 2019-08-04 DIAGNOSIS — F4310 Post-traumatic stress disorder, unspecified: Secondary | ICD-10-CM | POA: Diagnosis not present

## 2019-08-15 ENCOUNTER — Other Ambulatory Visit: Payer: Self-pay

## 2019-08-15 ENCOUNTER — Telehealth (INDEPENDENT_AMBULATORY_CARE_PROVIDER_SITE_OTHER): Payer: BC Managed Care – PPO | Admitting: Obstetrics & Gynecology

## 2019-08-15 ENCOUNTER — Encounter: Payer: Self-pay | Admitting: Obstetrics & Gynecology

## 2019-08-15 DIAGNOSIS — R140 Abdominal distension (gaseous): Secondary | ICD-10-CM

## 2019-08-15 DIAGNOSIS — N951 Menopausal and female climacteric states: Secondary | ICD-10-CM | POA: Diagnosis not present

## 2019-08-15 NOTE — Progress Notes (Signed)
Subjective:    Patient ID: Denise Gay, female    DOB: 1971-10-07, 48 y.o.   MRN: [id number]    TELEHEALTH VIRTUAL GYNECOLOGY VISIT ENCOUNTER NOTE  I connected with Denise Gay on [id number] at  3:00 PM EDT by telephone at home and verified that I am speaking with the correct person using two identifiers.   I discussed the limitations, risks, security and privacy concerns of performing an evaluation and management service by telephone and the availability of in person appointments. I also discussed with the patient that there may be a patient responsible charge related to this service. The patient expressed understanding and agreed to proceed.   History:  Denise Gay is a 48 y.o. [id number] female being evaluated today for  moodiness, hot flashes and weight gain.  Gained 5 pounds in last 2 months.  Working out a lot and eating well.  Pt having abdominal bloating.  Pt is more constipated than normal.  Pt has not had a menses since her ablation so she is not sure if she is in menopause.  No vaginal bleeding.        Past Medical History:  Diagnosis Date   Asthma    as a child   Diabetes mellitus without complication ([place])    was before gastric bypass but patient is not taking any medications for that   Gastric bypass status for obesity    History of staph infection    from previous lumbar back surgery   Hypertension    Mononucleosis    Sleep apnea    before gastric bypass no longer wearing CPAP   Spinal stenosis of lumbar region 01/18/2016   Past Surgical History:  Procedure Laterality Date   ANTERIOR CERVICAL DECOMP/DISCECTOMY FUSION N/A 05/13/2017   Procedure: ANTERIOR CERVICAL DECOMPRESSION FUSION, CERVICAL 3-4, CERVICAL 4-5 WITH INSTRUMENTATION AND ALLOGRAFT; HARDWARE REMOVAL AT CERVICAL 5-6, CERVICAL 6-7.;  Surgeon: Phylliss Bob, MD;  Location: [place];  Service: Orthopedics;  Laterality: N/A;  ANTERIOR CERVICAL DECOMPRESSION FUSION, CERVICAL 3-4, CERVICAL 4-5 WITH  INSTRUMENTATION AND ALLOGRAFT; HARDWARE REMOVAL AT CERVICAL 5-6, CERVICAL 6-   BREAST REDUCTION SURGERY     CERVICAL LAMINECTOMY     GASTRIC BYPASS  08/2011   lumber surgery     The following portions of the patient's history were reviewed and updated as appropriate: allergies, current medications, past family history, past medical history, past social history, past surgical history and problem list.   Health Maintenance:  Normal pap and negative HRHPV on March 2020.  Mammogram ordered.   Review of Systems:  Pertinent items noted in HPI and remainder of comprehensive ROS otherwise negative.  Physical Exam:   General:  Alert, oriented and cooperative.   Mental Status: Normal mood and affect perceived. Normal judgment and thought content.  Physical exam deferred due to nature of the encounter  Labs and Imaging No results found for this or any previous visit (from the past 336 hour(s)). US Abdomen Complete  Result Date: 07/30/2019 CLINICAL DATA:  Right upper quadrant pain EXAM: ABDOMEN ULTRASOUND COMPLETE COMPARISON:  None. FINDINGS: Gallbladder: The gallbladder is not well evaluated. The gallbladder wall appears thickened measuring approximately 7 mm. The gallbladder may be contracted or filled with stones. Common bile duct: Diameter: 3 mm Liver: No focal lesion identified. Within normal limits in parenchymal echogenicity. Portal vein is patent on color Doppler imaging with normal direction of blood flow towards the liver. IVC: No abnormality visualized. Pancreas: The pancreas was not visualized secondary  to poor sonographic windows an overlying bowel gas. Spleen: Size and appearance within normal limits. Right Kidney: Length: 11.4 cm. Echogenicity within normal limits. No mass or hydronephrosis visualized. Left Kidney: Length: 11.3 cm. Echogenicity within normal limits. No mass or hydronephrosis visualized. Abdominal aorta: No aneurysm visualized. Other findings: None. IMPRESSION:  Sonographic evaluation of the gallbladder demonstrates either a gallbladder filled with stones or a contracted gallbladder (the patient was reportedly did not NPO.). There does appear to be some gallbladder wall thickening. These findings can be further evaluated with a contrast enhanced CT as clinically indicated. Correlation with laboratory studies is recommended. Electronically Signed   By: Constance Holster M.D.   On: 07/30/2019 03:14      Assessment and Plan:     1. Menopausal symptom - Follicle stimulating hormone  2. Abdominal bloating - US PELVIS TRANSVAGINAL NON-OB (TV ONLY); Future  3.  Pt needs inperson visit to evaluate abdominal bloating.     I discussed the assessment and treatment plan with the patient. The patient was provided an opportunity to ask questions and all were answered. The patient agreed with the plan and demonstrated an understanding of the instructions.   The patient was advised to call back or seek an in-person evaluation/go to the ED if the symptoms worsen or if the condition fails to improve as anticipated.  I provided 15 minutes of non-face-to-face time during this encounter.   Silas Sacramento, MD [facility] for [employer], [place]

## 2019-08-15 NOTE — Progress Notes (Signed)
Here for virtual visit to discuss her menopause symptoms.

## 2019-08-17 DIAGNOSIS — K8010 Calculus of gallbladder with chronic cholecystitis without obstruction: Secondary | ICD-10-CM | POA: Diagnosis not present

## 2019-08-17 DIAGNOSIS — Z9884 Bariatric surgery status: Secondary | ICD-10-CM | POA: Diagnosis not present

## 2019-08-18 ENCOUNTER — Other Ambulatory Visit: Payer: BC Managed Care – PPO

## 2019-08-18 ENCOUNTER — Other Ambulatory Visit: Payer: Self-pay | Admitting: Sports Medicine

## 2019-08-18 DIAGNOSIS — F4310 Post-traumatic stress disorder, unspecified: Secondary | ICD-10-CM | POA: Diagnosis not present

## 2019-08-18 DIAGNOSIS — Z9889 Other specified postprocedural states: Secondary | ICD-10-CM

## 2019-08-18 MED ORDER — CELECOXIB 200 MG PO CAPS: ORAL_CAPSULE | ORAL | 2 refills | Status: DC

## 2019-08-19 ENCOUNTER — Other Ambulatory Visit: Payer: Self-pay | Admitting: Obstetrics & Gynecology

## 2019-08-19 DIAGNOSIS — N951 Menopausal and female climacteric states: Secondary | ICD-10-CM

## 2019-08-19 NOTE — Progress Notes (Signed)
fsh ordered due to menopausal symptoms.

## 2019-08-29 ENCOUNTER — Ambulatory Visit (HOSPITAL_COMMUNITY)
Admission: RE | Admit: 2019-08-29 | Discharge: 2019-08-29 | Disposition: A | Discharge status: Home / Self Care | Payer: BC Managed Care – PPO | Source: Ambulatory Visit | Attending: Obstetrics & Gynecology | Admitting: Obstetrics & Gynecology

## 2019-08-29 ENCOUNTER — Other Ambulatory Visit: Payer: Self-pay

## 2019-08-29 DIAGNOSIS — R140 Abdominal distension (gaseous): Secondary | ICD-10-CM | POA: Diagnosis not present

## 2019-08-31 ENCOUNTER — Other Ambulatory Visit: Payer: Self-pay

## 2019-08-31 ENCOUNTER — Other Ambulatory Visit (INDEPENDENT_AMBULATORY_CARE_PROVIDER_SITE_OTHER): Payer: BC Managed Care – PPO | Admitting: *Deleted

## 2019-08-31 DIAGNOSIS — N951 Menopausal and female climacteric states: Secondary | ICD-10-CM

## 2019-08-31 DIAGNOSIS — N959 Unspecified menopausal and perimenopausal disorder: Secondary | ICD-10-CM | POA: Diagnosis not present

## 2019-08-31 NOTE — Progress Notes (Signed)
Pt here for [facility] blood draw only

## 2019-09-01 LAB — FOLLICLE STIMULATING HORMONE: FSH: 61.2 m[IU]/mL

## 2019-09-02 DIAGNOSIS — Z1159 Encounter for screening for other viral diseases: Secondary | ICD-10-CM | POA: Diagnosis not present

## 2019-09-05 ENCOUNTER — Ambulatory Visit (INDEPENDENT_AMBULATORY_CARE_PROVIDER_SITE_OTHER): Payer: BC Managed Care – PPO | Admitting: Obstetrics and Gynecology

## 2019-09-05 ENCOUNTER — Encounter: Payer: Self-pay | Admitting: Obstetrics and Gynecology

## 2019-09-05 ENCOUNTER — Other Ambulatory Visit: Payer: Self-pay

## 2019-09-05 ENCOUNTER — Telehealth: Payer: Self-pay | Admitting: *Deleted

## 2019-09-05 VITALS — BP 136/80 | HR 99 | Resp 16 | Ht 67.0 in | Wt 185.0 lb

## 2019-09-05 DIAGNOSIS — D219 Benign neoplasm of connective and other soft tissue, unspecified: Secondary | ICD-10-CM

## 2019-09-05 DIAGNOSIS — Z79890 Hormone replacement therapy (postmenopausal): Secondary | ICD-10-CM | POA: Diagnosis not present

## 2019-09-05 DIAGNOSIS — N951 Menopausal and female climacteric states: Secondary | ICD-10-CM | POA: Diagnosis not present

## 2019-09-05 MED ORDER — PREMPRO 0.3-1.5 MG PO TABS: 1.0000 | ORAL_TABLET | Freq: Every day | ORAL | 1 refills | Status: DC

## 2019-09-05 MED ORDER — GABAPENTIN 600 MG PO TABS: 600.0000 mg | ORAL_TABLET | Freq: Every day | ORAL | 1 refills | Status: DC

## 2019-09-05 NOTE — Progress Notes (Signed)
GYNECOLOGY ENCOUNTER NOTE  History:     Denise Gay is a 48 y.o. [id number] female  Here for f/u from recent pelvic US and initiation of hormone replacement therapy d/t hot flashes. Last FSH level was 61.  She had an ablasion done 4 years ago and has had no menstrual cycle since  She attests to hot flashes mainly at night. Would like to start therapy. Having some vaginal dryness however not sexually active often and does not find it to be a problem.   Obstetric History OB History  Gravida Para Term Preterm AB Living  2 0     2    SAB TAB Ectopic Multiple Live Births    2          # Outcome Date GA Lbr Len/2nd Weight Sex Delivery Anes PTL Lv  2 TAB           1 TAB             Past Medical History:  Diagnosis Date  . Asthma    as a child  . Diabetes mellitus without complication ([place])    was before gastric bypass but patient is not taking any medications for that  . Gastric bypass status for obesity   . History of staph infection    from previous lumbar back surgery  . Hypertension   . Mononucleosis   . Sleep apnea    before gastric bypass no longer wearing CPAP  . Spinal stenosis of lumbar region 01/18/2016    Past Surgical History:  Procedure Laterality Date  . ANTERIOR CERVICAL DECOMP/DISCECTOMY FUSION N/A 05/13/2017   Procedure: ANTERIOR CERVICAL DECOMPRESSION FUSION, CERVICAL 3-4, CERVICAL 4-5 WITH INSTRUMENTATION AND ALLOGRAFT; HARDWARE REMOVAL AT CERVICAL 5-6, CERVICAL 6-7.;  Surgeon: Phylliss Bob, MD;  Location: [place];  Service: Orthopedics;  Laterality: N/A;  ANTERIOR CERVICAL DECOMPRESSION FUSION, CERVICAL 3-4, CERVICAL 4-5 WITH INSTRUMENTATION AND ALLOGRAFT; HARDWARE REMOVAL AT CERVICAL 5-6, CERVICAL 6-  . BREAST REDUCTION SURGERY    . CERVICAL LAMINECTOMY    . GASTRIC BYPASS  08/2011  . lumber surgery      Current Outpatient Medications on File Prior to Visit  Medication Sig Dispense Refill  . Biotin 5000 MCG SUBL Place 1 tablet under the tongue daily.     . celecoxib (CELEBREX) 200 MG capsule TAKE 1 TO 2 TABLETS BY MOUTH DAILY AS NEEDED FOR PAIN. 60 capsule 2  . losartan (COZAAR) 100 MG tablet Take 1 tablet (100 mg total) by mouth daily. 90 tablet 1  . Multiple Vitamins-Minerals (MULTIVITAMIN WITH MINERALS) tablet Take 1 tablet by mouth daily.    Marland Kitchen PARoxetine (PAXIL) 30 MG tablet Take 30 mg by mouth daily.    . pregabalin (LYRICA) 100 MG capsule Take 1 capsule (100 mg total) by mouth 2 (two) times daily. 60 capsule 3  . topiramate (TOPAMAX) 50 MG tablet Take 100 mg by mouth at bedtime.    . traMADol (ULTRAM) 50 MG tablet TAKE 1 TABLET BY MOUTH EVERY 8 HOURS AS NEEDED FOR MODERATE PAIN. MAXIMUM 6 TABS PER DAY. (Patient not taking: Reported on 08/15/2019) 21 tablet 0   No current facility-administered medications on file prior to visit.     No Known Allergies  Social History:  reports that she quit smoking about 13 years ago. She has never used smokeless tobacco. She reports that she does not drink alcohol or use drugs.  Family History  Problem Relation Age of Onset  . Diabetes Mother   .  Hypertension Father   . Diabetes Brother   . Diabetes Maternal Grandmother     The following portions of the patient's history were reviewed and updated as appropriate: allergies, current medications, past family history, past medical history, past social history, past surgical history and problem list.  Review of Systems Pertinent items noted in HPI and remainder of comprehensive ROS otherwise negative.  Physical Exam:  BP 136/80   Pulse 99   Resp 16   Ht 5\' 7"  (1.702 m)   Wt 185 lb (83.9 kg)   BMI 28.98 kg/m  CONSTITUTIONAL: Well-developed, well-nourished female in no acute distress.  HENT:  Normocephalic EYES: Conjunctivae and EOM are normal.  NECK: Normal range of motion SKIN: Skin is warm and dry. MUSCULOSKELETAL: Normal range of motion.  NEUROLOGIC: Alert and oriented to person, place, and time. Normal reflexes, muscle tone  coordination.  PSYCHIATRIC: Normal mood and affect.  Assessment and Plan:   1. Postmenopausal hormone therapy   2. Hot flashes due to menopause  Recommended Neurontin patient decline d/t past history of use with significant weight gain. Hx of HTN on medications. Discussed risks of cardiovascular injury, VTE with estrogen use. Patient aware. Rx: Prempro 0.3 daily. F/u in the office in 3 months with MD. If she developes chest pain, or leg pain. Stop the medication and go to the nearest ER.      Taiwo Fish, Artist Pais, [place] for [employer], [place]

## 2019-09-05 NOTE — Telephone Encounter (Signed)
Left patient an urgent message to call the office about her appointment provider changing to [facility] instead of Dr. Gala Romney.

## 2019-09-08 ENCOUNTER — Other Ambulatory Visit: Payer: Self-pay | Admitting: General Surgery

## 2019-09-08 DIAGNOSIS — K8010 Calculus of gallbladder with chronic cholecystitis without obstruction: Secondary | ICD-10-CM | POA: Diagnosis not present

## 2019-09-08 DIAGNOSIS — K8012 Calculus of gallbladder with acute and chronic cholecystitis without obstruction: Secondary | ICD-10-CM | POA: Diagnosis not present

## 2019-09-23 DIAGNOSIS — F4310 Post-traumatic stress disorder, unspecified: Secondary | ICD-10-CM | POA: Diagnosis not present

## 2019-10-05 DIAGNOSIS — F4310 Post-traumatic stress disorder, unspecified: Secondary | ICD-10-CM | POA: Diagnosis not present

## 2019-10-07 DIAGNOSIS — I10 Essential (primary) hypertension: Secondary | ICD-10-CM | POA: Diagnosis not present

## 2019-10-07 DIAGNOSIS — F5105 Insomnia due to other mental disorder: Secondary | ICD-10-CM | POA: Diagnosis not present

## 2019-10-07 DIAGNOSIS — F411 Generalized anxiety disorder: Secondary | ICD-10-CM | POA: Diagnosis not present

## 2019-10-07 DIAGNOSIS — F4310 Post-traumatic stress disorder, unspecified: Secondary | ICD-10-CM | POA: Diagnosis not present

## 2019-10-07 DIAGNOSIS — F4323 Adjustment disorder with mixed anxiety and depressed mood: Secondary | ICD-10-CM | POA: Diagnosis not present

## 2019-11-01 ENCOUNTER — Other Ambulatory Visit: Payer: Self-pay | Admitting: *Deleted

## 2019-11-01 DIAGNOSIS — I10 Essential (primary) hypertension: Secondary | ICD-10-CM

## 2019-11-01 MED ORDER — LOSARTAN POTASSIUM 100 MG PO TABS: 100.0000 mg | ORAL_TABLET | Freq: Every day | ORAL | 0 refills | Status: DC

## 2019-11-17 DIAGNOSIS — F4310 Post-traumatic stress disorder, unspecified: Secondary | ICD-10-CM | POA: Diagnosis not present

## 2019-11-25 DIAGNOSIS — Z9884 Bariatric surgery status: Secondary | ICD-10-CM | POA: Diagnosis not present

## 2019-11-25 DIAGNOSIS — K909 Intestinal malabsorption, unspecified: Secondary | ICD-10-CM | POA: Diagnosis not present

## 2019-12-09 DIAGNOSIS — F4310 Post-traumatic stress disorder, unspecified: Secondary | ICD-10-CM | POA: Diagnosis not present

## 2019-12-29 DIAGNOSIS — Z9884 Bariatric surgery status: Secondary | ICD-10-CM | POA: Diagnosis not present

## 2019-12-29 DIAGNOSIS — K909 Intestinal malabsorption, unspecified: Secondary | ICD-10-CM | POA: Diagnosis not present

## 2020-01-06 DIAGNOSIS — F411 Generalized anxiety disorder: Secondary | ICD-10-CM | POA: Diagnosis not present

## 2020-01-06 DIAGNOSIS — F4323 Adjustment disorder with mixed anxiety and depressed mood: Secondary | ICD-10-CM | POA: Diagnosis not present

## 2020-01-06 DIAGNOSIS — F4310 Post-traumatic stress disorder, unspecified: Secondary | ICD-10-CM | POA: Diagnosis not present

## 2020-01-06 DIAGNOSIS — F5105 Insomnia due to other mental disorder: Secondary | ICD-10-CM | POA: Diagnosis not present

## 2020-01-19 DIAGNOSIS — F4310 Post-traumatic stress disorder, unspecified: Secondary | ICD-10-CM | POA: Diagnosis not present

## 2020-01-21 ENCOUNTER — Other Ambulatory Visit: Payer: Self-pay | Admitting: Sports Medicine

## 2020-01-21 DIAGNOSIS — Z9889 Other specified postprocedural states: Secondary | ICD-10-CM

## 2020-01-25 ENCOUNTER — Other Ambulatory Visit: Payer: Self-pay | Admitting: Sports Medicine

## 2020-01-25 DIAGNOSIS — I10 Essential (primary) hypertension: Secondary | ICD-10-CM

## 2020-02-02 DIAGNOSIS — J301 Allergic rhinitis due to pollen: Secondary | ICD-10-CM

## 2020-02-03 DIAGNOSIS — J309 Allergic rhinitis, unspecified: Secondary | ICD-10-CM | POA: Insufficient documentation

## 2020-02-03 MED ORDER — FLUTICASONE PROPIONATE 50 MCG/ACT NA SUSP: NASAL | 3 refills | Status: DC

## 2020-02-03 NOTE — Telephone Encounter (Signed)
Not on current med list and I do not see where you have written this before. Patient now seen in 6 months

## 2020-02-10 ENCOUNTER — Other Ambulatory Visit: Payer: Self-pay | Admitting: Sports Medicine

## 2020-02-10 DIAGNOSIS — Z9889 Other specified postprocedural states: Secondary | ICD-10-CM

## 2020-02-29 DIAGNOSIS — F4310 Post-traumatic stress disorder, unspecified: Secondary | ICD-10-CM | POA: Diagnosis not present

## 2020-03-09 ENCOUNTER — Other Ambulatory Visit: Payer: Self-pay | Admitting: Sports Medicine

## 2020-03-09 DIAGNOSIS — Z9889 Other specified postprocedural states: Secondary | ICD-10-CM

## 2020-04-21 ENCOUNTER — Other Ambulatory Visit: Payer: Self-pay | Admitting: Sports Medicine

## 2020-04-21 DIAGNOSIS — I10 Essential (primary) hypertension: Secondary | ICD-10-CM

## 2020-04-30 ENCOUNTER — Other Ambulatory Visit: Payer: Self-pay | Admitting: Sports Medicine

## 2020-04-30 DIAGNOSIS — Z9889 Other specified postprocedural states: Secondary | ICD-10-CM

## 2020-06-14 ENCOUNTER — Encounter (HOSPITAL_COMMUNITY): Payer: Self-pay

## 2020-06-14 ENCOUNTER — Ambulatory Visit (HOSPITAL_COMMUNITY): Admit: 2020-06-14 | Payer: BC Managed Care – PPO | Admitting: Obstetrics and Gynecology

## 2020-06-14 SURGERY — CERCLAGE, CERVIX, VAGINAL APPROACH
Anesthesia: Choice

## 2020-07-23 ENCOUNTER — Other Ambulatory Visit: Payer: Self-pay

## 2020-07-23 ENCOUNTER — Ambulatory Visit (INDEPENDENT_AMBULATORY_CARE_PROVIDER_SITE_OTHER): Payer: Commercial Managed Care - PPO | Admitting: Sports Medicine

## 2020-07-23 ENCOUNTER — Ambulatory Visit (INDEPENDENT_AMBULATORY_CARE_PROVIDER_SITE_OTHER): Payer: Commercial Managed Care - PPO

## 2020-07-23 DIAGNOSIS — Z23 Encounter for immunization: Secondary | ICD-10-CM | POA: Diagnosis not present

## 2020-07-23 DIAGNOSIS — J301 Allergic rhinitis due to pollen: Secondary | ICD-10-CM

## 2020-07-23 DIAGNOSIS — Z9889 Other specified postprocedural states: Secondary | ICD-10-CM

## 2020-07-23 DIAGNOSIS — Z0000 Encounter for general adult medical examination without abnormal findings: Secondary | ICD-10-CM | POA: Diagnosis not present

## 2020-07-23 DIAGNOSIS — M542 Cervicalgia: Secondary | ICD-10-CM | POA: Diagnosis not present

## 2020-07-23 MED ORDER — PREGABALIN 150 MG PO CAPS: 150.0000 mg | ORAL_CAPSULE | Freq: Two times a day (BID) | ORAL | 3 refills | Status: DC

## 2020-07-23 MED ORDER — LEVOCETIRIZINE DIHYDROCHLORIDE 5 MG PO TABS: 5.0000 mg | ORAL_TABLET | Freq: Every evening | ORAL | 3 refills | Status: DC

## 2020-07-23 NOTE — Assessment & Plan Note (Signed)
Has had moderate responses to Allegra, switching to Xyzal. Continue Flonase. Symptoms are predominantly rhinitis.

## 2020-07-23 NOTE — Assessment & Plan Note (Signed)
Denise Gay has cervical fusion syndrome, epidurals have not been effective and there is no surgical option per Dr. Lynann Bologna. She is having increasing crunching over the past couple of weeks, mild pain. Historically she has been well controlled Lyrica 100 twice daily, Celebrex, and tramadol. Increasing Lyrica to 150 twice daily. Adding some x-rays. Return to see me in a month, we can do a virtual visit for this.

## 2020-07-23 NOTE — Progress Notes (Signed)
    Procedures performed today:    None.  Independent interpretation of notes and tests performed by another provider:   None.  Brief History, Exam, Impression, and Recommendations:    Annual physical exam Adding routine labs, colon cancer screening, flu shot, mammogram today.  H/O C3-C7 Fusion Denise Gay has cervical fusion syndrome, epidurals have not been effective and there is no surgical option per Dr. Lynann Bologna. She is having increasing crunching over the past couple of weeks, mild pain. Historically she has been well controlled Lyrica 100 twice daily, Celebrex, and tramadol. Increasing Lyrica to 150 twice daily. Adding some x-rays. Return to see me in a month, we can do a virtual visit for this.  Allergic rhinitis Has had moderate responses to Allegra, switching to Xyzal. Continue Flonase. Symptoms are predominantly rhinitis.    ___________________________________________ Gwen Her. Dianah Field, M.D., ABFM., CAQSM. Primary Care and [place] Instructor of [place] [employer] [facility] [employer]

## 2020-07-23 NOTE — Assessment & Plan Note (Signed)
Adding routine labs, colon cancer screening, flu shot, mammogram today.

## 2020-07-25 ENCOUNTER — Other Ambulatory Visit: Payer: Self-pay

## 2020-07-25 ENCOUNTER — Ambulatory Visit (INDEPENDENT_AMBULATORY_CARE_PROVIDER_SITE_OTHER): Payer: Commercial Managed Care - PPO

## 2020-07-25 DIAGNOSIS — Z1231 Encounter for screening mammogram for malignant neoplasm of breast: Secondary | ICD-10-CM

## 2020-07-25 DIAGNOSIS — Z0000 Encounter for general adult medical examination without abnormal findings: Secondary | ICD-10-CM

## 2020-07-26 LAB — COMPREHENSIVE METABOLIC PANEL
AG Ratio: 1.5 (calc) (ref 1.0–2.5)
ALT: 11 U/L (ref 6–29)
AST: 14 U/L (ref 10–35)
Albumin: 4.1 g/dL (ref 3.6–5.1)
Alkaline phosphatase (APISO): 50 U/L (ref 37–153)
BUN: 23 mg/dL (ref 7–25)
CO2: 22 mmol/L (ref 20–32)
Calcium: 9.4 mg/dL (ref 8.6–10.4)
Chloride: 108 mmol/L (ref 98–110)
Creat: 0.79 mg/dL (ref 0.50–1.05)
Globulin: 2.7 g/dL (calc) (ref 1.9–3.7)
Glucose, Bld: 103 mg/dL — ABNORMAL HIGH (ref 65–99)
Potassium: 4.4 mmol/L (ref 3.5–5.3)
Sodium: 137 mmol/L (ref 135–146)
Total Bilirubin: 0.4 mg/dL (ref 0.2–1.2)
Total Protein: 6.8 g/dL (ref 6.1–8.1)

## 2020-07-26 LAB — CBC
HCT: 39.1 % (ref 35.0–45.0)
Hemoglobin: 13.1 g/dL (ref 11.7–15.5)
MCH: 31.1 pg (ref 27.0–33.0)
MCHC: 33.5 g/dL (ref 32.0–36.0)
MCV: 92.9 fL (ref 80.0–100.0)
MPV: 11.4 fL (ref 7.5–12.5)
Platelets: 175 10*3/uL (ref 140–400)
RBC: 4.21 10*6/uL (ref 3.80–5.10)
RDW: 12 % (ref 11.0–15.0)
WBC: 7.3 10*3/uL (ref 3.8–10.8)

## 2020-07-26 LAB — LIPID PANEL
Cholesterol: 188 mg/dL (ref <200)
HDL: 58 mg/dL (ref >=50)
LDL Cholesterol (Calc): 108 mg/dL (calc) — ABNORMAL HIGH
Non-HDL Cholesterol (Calc): 130 mg/dL (calc) — ABNORMAL HIGH (ref <130)
Total CHOL/HDL Ratio: 3.2 (calc) (ref <5.0)
Triglycerides: 110 mg/dL (ref <150)

## 2020-07-26 LAB — TSH: TSH: 1.19 mIU/L

## 2020-07-27 DIAGNOSIS — F3132 Bipolar disorder, current episode depressed, moderate: Secondary | ICD-10-CM | POA: Insufficient documentation

## 2020-08-05 ENCOUNTER — Other Ambulatory Visit: Payer: Self-pay | Admitting: Sports Medicine

## 2020-08-05 DIAGNOSIS — I10 Essential (primary) hypertension: Secondary | ICD-10-CM

## 2020-08-12 LAB — COLOGUARD
COLOGUARD: NEGATIVE
Cologuard: NEGATIVE

## 2020-08-20 ENCOUNTER — Telehealth (INDEPENDENT_AMBULATORY_CARE_PROVIDER_SITE_OTHER): Payer: Commercial Managed Care - PPO | Admitting: Sports Medicine

## 2020-08-20 ENCOUNTER — Other Ambulatory Visit: Payer: Self-pay

## 2020-08-20 ENCOUNTER — Ambulatory Visit (INDEPENDENT_AMBULATORY_CARE_PROVIDER_SITE_OTHER): Payer: Commercial Managed Care - PPO

## 2020-08-20 DIAGNOSIS — M5136 Other intervertebral disc degeneration, lumbar region: Secondary | ICD-10-CM

## 2020-08-20 DIAGNOSIS — M48061 Spinal stenosis, lumbar region without neurogenic claudication: Secondary | ICD-10-CM | POA: Diagnosis not present

## 2020-08-20 DIAGNOSIS — Z9889 Other specified postprocedural states: Secondary | ICD-10-CM

## 2020-08-20 HISTORY — PX: CHOLECYSTECTOMY: SHX55

## 2020-08-20 MED ORDER — PREGABALIN 150 MG PO CAPS: 150.0000 mg | ORAL_CAPSULE | Freq: Three times a day (TID) | ORAL | 3 refills | Status: DC

## 2020-08-20 NOTE — Addendum Note (Signed)
Addended by: Silverio Decamp on: 08/20/2020 03:58 PM   Modules accepted: Orders

## 2020-08-20 NOTE — Progress Notes (Signed)
Patient reports her left thigh has an achy dull pain in it times one week.  Started Seroquel and zoloft 07/27/20.  No injuries.  She did a protein only diet and that is when she noticed the pain in her legs.

## 2020-08-20 NOTE — Assessment & Plan Note (Addendum)
Denise Gay has been having increasing left thigh numbness, and progressive weakness of her left leg. She has known lumbar spinal stenosis with operative interventions as documented above. Due to her progressive weakness we are to proceed with x-rays/MRI, we are also going up on her Lyrica. I do anticipate a left L3-L4 epidural. Hopefully we can get the MRI today.  Ordering left L3-L4 interlaminar epidural with Dr. Francesco Runner.

## 2020-08-20 NOTE — Progress Notes (Addendum)
   Virtual Visit via WebEx/MyChart   I connected with  Denise Gay  on [birth year] via WebEx/MyChart/Doximity Video and verified that I am speaking with the correct person using two identifiers.   I discussed the limitations, risks, security and privacy concerns of performing an evaluation and management service by WebEx/MyChart/Doximity Video, including the higher likelihood of inaccurate diagnosis and treatment, and the availability of in person appointments.  We also discussed the likely need of an additional face to face encounter for complete and high quality delivery of care.  I also discussed with the patient that there may be a patient responsible charge related to this service. The patient expressed understanding and wishes to proceed.  Provider location is in medical facility. Patient location is at their home, different from provider location. People involved in care of the patient during this telehealth encounter were myself, my nurse/medical assistant, and my front office/scheduling team member.  Review of Systems: No fevers, chills, night sweats, weight loss, chest pain, or shortness of breath.   Objective Findings:    General: Speaking full sentences, no audible heavy breathing.  Sounds alert and appropriately interactive.  Appears well.  Face symmetric.  Extraocular movements intact.  Pupils equal and round.  No nasal flaring or accessory muscle use visualized.  Independent interpretation of tests performed by another provider:   I personally reviewed images MRI from 2017 she does have evidence of a 2 level decompression from L3-L5, at the L4-L5 level there are significant fluid collections, likely postoperative seromas contributing to spinal stenosis, at the L3-L4 level she has severe spinal stenosis.  MRI from today personally reviewed, there is severe L3-L4 lumbar spinal stenosis.  Brief History, Exam, Impression, and Recommendations:    H/O C3-C7 Fusion Denise Gay has done  well, she has cervical fusion syndrome, epidurals have not been effective and per Dr. Trudie Buckler there is no additional surgical option, we increased her Lyrica to 150 mg twice a day the last visit, she self increased to 3 times daily and is doing okay from this perspective, refilling Lyrica to be taken up to 3 times daily.  Spinal stenosis of lumbar region Denise Gay has been having increasing left thigh numbness, and progressive weakness of her left leg. She has known lumbar spinal stenosis with operative interventions as documented above. Due to her progressive weakness we are to proceed with x-rays/MRI, we are also going up on her Lyrica. I do anticipate a left L3-L4 epidural. Hopefully we can get the MRI today.  Ordering left L3-L4 interlaminar epidural with Dr. Francesco Runner.   I discussed the above assessment and treatment plan with the patient. The patient was provided an opportunity to ask questions and all were answered. The patient agreed with the plan and demonstrated an understanding of the instructions.   The patient was advised to call back or seek an in-person evaluation if the symptoms worsen or if the condition fails to improve as anticipated.   I provided 30 minutes of face to face and non-face-to-face time during this encounter date, time was needed to gather information, review chart, records, communicate/coordinate with staff remotely, as well as complete documentation.   ___________________________________________ Gwen Her. Dianah Field, M.D., ABFM., CAQSM. Primary Care and [place] Instructor of [place] [employer] [facility]

## 2020-08-20 NOTE — Assessment & Plan Note (Signed)
Denise Gay has done well, she has cervical fusion syndrome, epidurals have not been effective and per Dr. Trudie Buckler there is no additional surgical option, we increased her Lyrica to 150 mg twice a day the last visit, she self increased to 3 times daily and is doing okay from this perspective, refilling Lyrica to be taken up to 3 times daily.

## 2020-09-23 DIAGNOSIS — Z9889 Other specified postprocedural states: Secondary | ICD-10-CM

## 2020-09-23 DIAGNOSIS — M48061 Spinal stenosis, lumbar region without neurogenic claudication: Secondary | ICD-10-CM

## 2020-09-25 MED ORDER — TRAMADOL HCL 50 MG PO TABS: ORAL_TABLET | ORAL | 0 refills | Status: DC

## 2020-09-25 NOTE — Addendum Note (Signed)
Addended by: Silverio Decamp on: 09/25/2020 01:40 PM   Modules accepted: Orders

## 2020-09-25 NOTE — Telephone Encounter (Signed)
I spent 5 total minutes of online digital evaluation and management services. 

## 2020-09-27 NOTE — Addendum Note (Signed)
Addended by: Silverio Decamp on: 09/27/2020 08:57 PM   Modules accepted: Orders

## 2020-10-10 ENCOUNTER — Other Ambulatory Visit: Payer: Self-pay | Admitting: Orthopedic Surgery

## 2020-10-10 DIAGNOSIS — M5416 Radiculopathy, lumbar region: Secondary | ICD-10-CM

## 2020-10-18 ENCOUNTER — Ambulatory Visit
Admission: RE | Admit: 2020-10-18 | Discharge: 2020-10-18 | Disposition: A | Discharge status: Home / Self Care | Payer: Commercial Managed Care - PPO | Source: Ambulatory Visit | Attending: Orthopedic Surgery | Admitting: Orthopedic Surgery

## 2020-10-18 ENCOUNTER — Other Ambulatory Visit: Payer: Self-pay

## 2020-10-18 DIAGNOSIS — M5416 Radiculopathy, lumbar region: Secondary | ICD-10-CM

## 2020-10-18 MED ORDER — METHYLPREDNISOLONE ACETATE 40 MG/ML INJ SUSP (RADIOLOG
120.0000 mg | Freq: Once | INTRAMUSCULAR | Status: AC
  Administered 2020-10-18: 120 mg via EPIDURAL

## 2020-10-18 MED ORDER — IOPAMIDOL (ISOVUE-M 200) INJECTION 41%
1.0000 mL | Freq: Once | INTRAMUSCULAR | Status: AC
  Administered 2020-10-18: 1 mL via EPIDURAL

## 2020-10-18 NOTE — Discharge Instructions (Signed)

## 2020-10-26 ENCOUNTER — Other Ambulatory Visit: Payer: Self-pay | Admitting: Sports Medicine

## 2020-10-26 DIAGNOSIS — I10 Essential (primary) hypertension: Secondary | ICD-10-CM

## 2020-11-15 ENCOUNTER — Other Ambulatory Visit: Payer: Self-pay

## 2020-11-15 ENCOUNTER — Ambulatory Visit: Payer: Commercial Managed Care - PPO | Admitting: Rehabilitative and Restorative Service Providers"

## 2020-11-15 DIAGNOSIS — M6281 Muscle weakness (generalized): Secondary | ICD-10-CM | POA: Diagnosis not present

## 2020-11-15 DIAGNOSIS — R29898 Other symptoms and signs involving the musculoskeletal system: Secondary | ICD-10-CM | POA: Diagnosis not present

## 2020-11-15 DIAGNOSIS — M25552 Pain in left hip: Secondary | ICD-10-CM | POA: Diagnosis not present

## 2020-11-15 NOTE — Patient Instructions (Signed)
Access Code: [facility] URL: [email or web address] Date: 11/15/2020 Prepared by: Rudell Cobb  Exercises Straight Leg Raise - 2 x daily - 7 x weekly - 1 sets - 10 reps Single Knee to Chest Stretch - 2 x daily - 7 x weekly - 1 sets - 2 reps - 30-60 seconds hold Supine Hamstring Stretch with Strap - 2 x daily - 7 x weekly - 1 sets - 3 reps - 30-45 seconds hold Supine ITB Stretch with Strap - 2 x daily - 7 x weekly - 1 sets - 3 reps - 30-45 seconds hold Sidelying Hip Abduction - 2 x daily - 7 x weekly - 2 sets - 10 reps Gastroc Stretch on Wall - 2 x daily - 7 x weekly - 1 sets - 2 reps - 30 seconds hold Wall Quarter Squat - 2 x daily - 7 x weekly - 1 sets - 10 reps - 5 seconds hold

## 2020-11-15 NOTE — Therapy (Signed)
[place], [place], [address] Phone: [phone]   Fax:  [phone]  Physical Therapy Evaluation  Patient Details  Name: Denise Gay MRN: [id number] Date of Birth: 1971-02-09 Referring Provider (PT): Phylliss Bob, MD   Encounter Date: 11/15/2020   PT End of Session - 11/15/20 0926    Visit Number 1    Number of Visits 12    Date for PT Re-Evaluation 12/27/20    Authorization Type UHC    PT Start Time 0845    PT Stop Time 0928    PT Time Calculation (min) 43 min    Activity Tolerance Patient tolerated treatment well    Behavior During Therapy [facility] for tasks assessed/performed           Past Medical History:  Diagnosis Date  . Asthma    as a child  . Diabetes mellitus without complication ([place])    was before gastric bypass but patient is not taking any medications for that  . Gastric bypass status for obesity   . History of staph infection    from previous lumbar back surgery  . Hypertension   . Mononucleosis   . Sleep apnea    before gastric bypass no longer wearing CPAP  . Spinal stenosis of lumbar region 01/18/2016    Past Surgical History:  Procedure Laterality Date  . ANTERIOR CERVICAL DECOMP/DISCECTOMY FUSION N/A 05/13/2017   Procedure: ANTERIOR CERVICAL DECOMPRESSION FUSION, CERVICAL 3-4, CERVICAL 4-5 WITH INSTRUMENTATION AND ALLOGRAFT; HARDWARE REMOVAL AT CERVICAL 5-6, CERVICAL 6-7.;  Surgeon: Phylliss Bob, MD;  Location: [place];  Service: Orthopedics;  Laterality: N/A;  ANTERIOR CERVICAL DECOMPRESSION FUSION, CERVICAL 3-4, CERVICAL 4-5 WITH INSTRUMENTATION AND ALLOGRAFT; HARDWARE REMOVAL AT CERVICAL 5-6, CERVICAL 6-  . BREAST REDUCTION SURGERY    . CERVICAL LAMINECTOMY    . GASTRIC BYPASS  08/2011  . lumber surgery    . REDUCTION MAMMAPLASTY      There were no vitals filed for this visit.    Subjective Assessment - 11/15/20 0848    Subjective The patient had quick  onset of L greater trochanteric pain about a month ago.  She initially thought it was effects from prior low back surgery, but injection did not improve it.  The L leg has been her weaker leg prior to back surgery.  She also had injection L hip about 1.5 weeks ago with some improvement.  "I've been in pain my whole life."  She notes neck and low back pain.    Pertinent History 2017 low back surgery;  2 neck surgeries, gastric bypass, breast reduction, h/o 2 foot surgeries.    Patient Stated Goals No pain in the left hip, improve the weakness in the left leg.    Currently in Pain? Yes    Pain Score 6     Pain Location Hip    Pain Orientation Left    Pain Descriptors / Indicators Sore;Stabbing    Pain Type Acute pain    Pain Onset More than a month ago    Pain Frequency Intermittent    Aggravating Factors  moving, walking    Pain Relieving Factors rest    Multiple Pain Sites Yes    Pain Score 7    Pain Location Back    Pain Orientation Lower    Pain Descriptors / Indicators Shooting;Discomfort;Sore    Pain Type Chronic pain    Pain Onset More than a month ago    Pain Frequency  Constant    Aggravating Factors  constant chronic pain    Pain Relieving Factors unsure              [facility] PT Assessment - 11/15/20 0900      Assessment   Medical Diagnosis L greater trochanteric bursitis    Referring Provider (PT) Phylliss Bob, MD    Onset Date/Surgical Date --   one month   Hand Dominance Left      Precautions   Precaution Comments h/o low back pain      Restrictions   Weight Bearing Restrictions No      Balance Screen   Has the patient fallen in the past 6 months No    Has the patient had a decrease in activity level because of a fear of falling?  No    Is the patient reluctant to leave their home because of a fear of falling?  No      Home Environment   Living Environment Private residence    Living Arrangements Spouse/significant other    Type of [place] Access  Level entry    [place] One level      Prior Function   Level of Independence Independent      Observation/Other Assessments   Focus on Therapeutic Outcomes (FOTO)  38% functional status score      Sensation   Light Touch Impaired Detail    Light Touch Impaired Details Impaired LLE   "my whole left leg gets numb" from prior back issues     Posture/Postural Control   Posture/Postural Control Postural limitations    Postural Limitations Decreased lumbar lordosis      ROM / Strength   AROM / PROM / Strength AROM;Strength      AROM   AROM Assessment Site Lumbar;Hip    Right/Left Hip Right;Left    Right Hip Flexion 110    Right Hip External Rotation  25    Left Hip Flexion 110    Left Hip External Rotation  20    Lumbar Flexion 25% limitation    Lumbar Extension 75% limitation   with pain   Lumbar - Right Side Bend 50% limitation    Lumbar - Left Side Bend 50% limitation      Strength   Overall Strength Deficits    Strength Assessment Site Hip;Knee;Ankle      Flexibility   Soft Tissue Assessment /Muscle Length yes    Hamstrings -20 from full extension L and 0 degrees R    ITB tightness and tenderness in L IT Band      Palpation   Palpation comment tightness and tenderness to palpation L glut med, L lateral hip, L IT Band.  *Tightness t/o bilateral hips and low back      Special Tests    Special Tests Hip Special Tests    Other special tests FADIR- negative bilat    Hip Special Tests  Saralyn Pilar (FABER) Test      Saralyn Pilar [facility]) Test   Findings Positive    Side Right;Left    Comments dec'd ROM and pain bilaterally                      Objective measurements completed on examination: See above findings.       [facility] Adult PT Treatment/Exercise - 11/15/20 0900      Exercises   Exercises Knee/Hip      Knee/Hip Exercises: Stretches   Active  Hamstring Stretch Right;Left;2 reps;30 seconds    ITB Stretch Right;Left;1 rep;30 seconds    Gastroc Stretch  Right;Left;30 seconds      Knee/Hip Exercises: Standing   Wall Squat 10 reps      Knee/Hip Exercises: Supine   Straight Leg Raises AROM;Strengthening;Right;Left;10 reps      Knee/Hip Exercises: Sidelying   Hip ABduction Strengthening;Right;Left;10 reps                  PT Education - 11/15/20 0924    Education Details HEP    Person(s) Educated Patient    Methods Explanation;Demonstration;Handout    Comprehension Verbalized understanding;Returned demonstration               PT Long Term Goals - 11/15/20 0930      PT LONG TERM GOAL #1   Title The patient will be indep with HEP.    Time 6    Period Weeks    Target Date 12/27/20      PT LONG TERM GOAL #2   Title The patient will improve functional status score from 38% to > or equal to 61%.    Time 6    Period Weeks    Target Date 12/27/20      PT LONG TERM GOAL #3   Title The patient will improve L hip abduction strength to 4+/5    Time 6    Period Weeks    Target Date 12/27/20      PT LONG TERM GOAL #4   Title The patient will report hip pain < or equal to 2/10 with household activities.    Time 6    Period Weeks    Target Date 12/27/20      PT LONG TERM GOAL #5   Title The patient will verbalize understanding of gym routine (member at [employer] -- not active since prior to pandemic).    Time 6    Period Weeks    Target Date 12/27/20                  Plan - 11/15/20 1406    Clinical Impression Statement The patient is a 50 yo female with h/o chronic low back pain and recent onset of L lateral hip pain.  She has undergone low back injection and L hip injection.  She presents with impairments in ROM, strength, soft tissue moiblity, flexibility, and pain limiting tolerance to activities.  PT to address deficits to work towards prior functional status and to decrease pain.    Personal Factors and Comorbidities Comorbidity 3+    Comorbidities h/o neck surgery, h/o low back surgery, gastric  by pass    Examination-Activity Limitations Lift;Stand;Stairs;Squat;Locomotion Level;Sleep    Examination-Participation Restrictions Cleaning;Meal Prep;Community Activity    Stability/Clinical Decision Making Stable/Uncomplicated    Clinical Decision Making Low    Rehab Potential Good    PT Frequency 2x / week    PT Duration 6 weeks    PT Treatment/Interventions ADLs/Self Care Home Management;Taping;Patient/family education;Dry needling;Manual techniques;Therapeutic exercise;Therapeutic activities;Gait training;Stair training;Electrical Stimulation;Cryotherapy;Iontophoresis 4mg /ml Dexamethasone;Moist Heat    PT Next Visit Plan Check initial HEP, self STM with foam roller, consider iontophoresis over hip, focus on glut/core/hip strengthening progressing loading activities    PT [place] and Agree with Plan of Care Patient           Patient will benefit from skilled therapeutic intervention in order to improve the following deficits and impairments:  Pain,Impaired  flexibility,Decreased strength,Increased fascial restricitons,Decreased range of motion,Postural dysfunction  Visit Diagnosis: Pain in left hip  Muscle weakness (generalized)  Other symptoms and signs involving the musculoskeletal system     Problem List Patient Active Problem List   Diagnosis Date Noted  . Allergic rhinitis 02/03/2020  . Hot flashes due to menopause 09/05/2019  . Postmenopausal hormone therapy 09/05/2019  . Radiculopathy 05/13/2017  . Otitis media 09/01/2016  . Obesity 03/10/2016  . Spinal stenosis of lumbar region 01/18/2016  . Progressive focal motor weakness 01/15/2016  . H/O C3-C7 Fusion 04/30/2015  . Annual physical exam 04/30/2015  . History of Roux-en-Y gastric bypass 11/28/2014  . Essential hypertension, benign 11/28/2014    Rudell Cobb, PT 11/15/2020, 3:12 PM  [facility] [place], [place], [address] Phone: [phone]   Fax:  [phone]  Name: LURIA ROSARIO MRN: [id number] Date of Birth: [birth year]

## 2020-11-19 ENCOUNTER — Encounter: Payer: Commercial Managed Care - PPO | Admitting: Physical Therapy

## 2020-11-22 ENCOUNTER — Ambulatory Visit: Payer: Commercial Managed Care - PPO | Admitting: Rehabilitative and Restorative Service Providers"

## 2020-11-22 ENCOUNTER — Encounter: Payer: Self-pay | Admitting: Rehabilitative and Restorative Service Providers"

## 2020-11-22 ENCOUNTER — Other Ambulatory Visit: Payer: Self-pay

## 2020-11-22 DIAGNOSIS — M6281 Muscle weakness (generalized): Secondary | ICD-10-CM | POA: Diagnosis not present

## 2020-11-22 DIAGNOSIS — R29898 Other symptoms and signs involving the musculoskeletal system: Secondary | ICD-10-CM

## 2020-11-22 DIAGNOSIS — M25552 Pain in left hip: Secondary | ICD-10-CM

## 2020-11-22 NOTE — Therapy (Signed)
[place], [place], [address] Phone: [phone]   Fax:  [phone]  Physical Therapy Treatment  Patient Details  Name: Denise Gay MRN: [id number] Date of Birth: [birth year] Referring Provider (PT): Phylliss Bob, MD   Encounter Date: 11/22/2020   PT End of Session - 11/22/20 1522    Visit Number 2    Number of Visits 12    Date for PT Re-Evaluation 12/27/20    Authorization Type UHC    PT Start Time [id number]    PT Stop Time 1528    PT Time Calculation (min) 43 min    Activity Tolerance Patient tolerated treatment well    Behavior During Therapy [facility] for tasks assessed/performed           Past Medical History:  Diagnosis Date  . Asthma    as a child  . Diabetes mellitus without complication ([place])    was before gastric bypass but patient is not taking any medications for that  . Gastric bypass status for obesity   . History of staph infection    from previous lumbar back surgery  . Hypertension   . Mononucleosis   . Sleep apnea    before gastric bypass no longer wearing CPAP  . Spinal stenosis of lumbar region 01/18/2016    Past Surgical History:  Procedure Laterality Date  . ANTERIOR CERVICAL DECOMP/DISCECTOMY FUSION N/A 05/13/2017   Procedure: ANTERIOR CERVICAL DECOMPRESSION FUSION, CERVICAL 3-4, CERVICAL 4-5 WITH INSTRUMENTATION AND ALLOGRAFT; HARDWARE REMOVAL AT CERVICAL 5-6, CERVICAL 6-7.;  Surgeon: Phylliss Bob, MD;  Location: [place];  Service: Orthopedics;  Laterality: N/A;  ANTERIOR CERVICAL DECOMPRESSION FUSION, CERVICAL 3-4, CERVICAL 4-5 WITH INSTRUMENTATION AND ALLOGRAFT; HARDWARE REMOVAL AT CERVICAL 5-6, CERVICAL 6-  . BREAST REDUCTION SURGERY    . CERVICAL LAMINECTOMY    . GASTRIC BYPASS  08/2011  . lumber surgery    . REDUCTION MAMMAPLASTY      There were no vitals filed for this visit.   Subjective Assessment - 11/22/20 1448    Subjective The patient reports the ther  ex went well.  Her feet are sore under the balls of her feet with ambulation and this is chronic pain.  Her hip pain has been more acute.    Pertinent History 2017 low back surgery;  2 neck surgeries, gastric bypass, breast reduction, h/o 2 foot surgeries.    Patient Stated Goals No pain in the left hip, improve the weakness in the left leg.    Currently in Pain? Yes    Pain Score 6     Pain Location Hip    Pain Orientation Left    Pain Descriptors / Indicators Sore;Stabbing    Pain Type Acute pain    Pain Onset More than a month ago    Pain Frequency Intermittent    Aggravating Factors  moving, walking    Pain Relieving Factors rest              [facility] PT Assessment - 11/22/20 1451      Assessment   Medical Diagnosis L greater trochanteric bursitis    Referring Provider (PT) Phylliss Bob, MD                         [facility] Adult PT Treatment/Exercise - 11/22/20 1451      Exercises   Exercises Knee/Hip;Lumbar      Lumbar Exercises: Stretches   Single Knee to Chest  Stretch Right;Left;1 rep;30 seconds      Lumbar Exercises: Standing   Wall Slides 10 reps      Lumbar Exercises: Quadruped   [place] 10 reps    Other Quadruped Lumbar Exercises toe extensor stretch      Knee/Hip Exercises: Aerobic   Recumbent Bike 3 minutes at level 4      Knee/Hip Exercises: Standing   Heel Raises Both    Heel Raises Limitations unable to tolerate due to L metatarsal pain    Hip Abduction Stengthening;Left;Right;10 reps    Abduction Limitations bilateral sidestepping x 10 reps x 2 sets    Lateral Step Up Right;Left;10 reps    Wall Squat 10 reps    SLS bilatral LEs near a wall x 10 seconds x 3 second holds    SLS with Vectors reaching to 20" surface to initiate single leg stance control    Gait Training verbal and demo cues to roll over ball of feet-- painful for patient on L LE; also performed marching, backwards walking x 20 feet x 3 reps each      Knee/Hip  Exercises: Seated   Other Seated Knee/Hip Exercises toe extensor and flexor stretch to tolerance      Manual Therapy   Manual Therapy Soft tissue mobilization    Manual therapy comments self massage:  Foam roller for quads and roller for IT band left side-- provided for HEP.  Also gave tennis ball for plantar surface mobilization to tolerance.                  PT Education - 11/22/20 1531    Education Details HEP    Person(s) Educated Patient    Methods Explanation;Demonstration;Handout    Comprehension Verbalized understanding;Returned demonstration               PT Long Term Goals - 11/15/20 0930      PT LONG TERM GOAL #1   Title The patient will be indep with HEP.    Time 6    Period Weeks    Target Date 12/27/20      PT LONG TERM GOAL #2   Title The patient will improve functional status score from 38% to > or equal to 61%.    Time 6    Period Weeks    Target Date 12/27/20      PT LONG TERM GOAL #3   Title The patient will improve L hip abduction strength to 4+/5    Time 6    Period Weeks    Target Date 12/27/20      PT LONG TERM GOAL #4   Title The patient will report hip pain < or equal to 2/10 with household activities.    Time 6    Period Weeks    Target Date 12/27/20      PT LONG TERM GOAL #5   Title The patient will verbalize understanding of gym routine (member at [employer] -- not active since prior to pandemic).    Time 6    Period Weeks    Target Date 12/27/20                 Plan - 11/22/20 1524    Clinical Impression Statement The patient has pain in her feet that contribute to abnormal gait pattern.  She tolerates ther ex well, but does need occasional rest breaks and stretching to reduce pain.  The patient does not tolerate heel raises and single leg  stance activities well.  Continue to progress tolerance.    PT Treatment/Interventions ADLs/Self Care Home Management;Taping;Patient/family education;Dry needling;Manual  techniques;Therapeutic exercise;Therapeutic activities;Gait training;Stair training;Electrical Stimulation;Cryotherapy;Iontophoresis 4mg /ml Dexamethasone;Moist Heat    PT Next Visit Plan elf STM with foam roller, consider iontophoresis over hip (currently seems like a larger area), focus on glut/core/hip strengthening progressing loading activities    PT [place] and Agree with Plan of Care Patient           Patient will benefit from skilled therapeutic intervention in order to improve the following deficits and impairments:     Visit Diagnosis: Pain in left hip  Muscle weakness (generalized)  Other symptoms and signs involving the musculoskeletal system     Problem List Patient Active Problem List   Diagnosis Date Noted  . Allergic rhinitis 02/03/2020  . Hot flashes due to menopause 09/05/2019  . Postmenopausal hormone therapy 09/05/2019  . Radiculopathy 05/13/2017  . Otitis media 09/01/2016  . Obesity 03/10/2016  . Spinal stenosis of lumbar region 01/18/2016  . Progressive focal motor weakness 01/15/2016  . H/O C3-C7 Fusion 04/30/2015  . Annual physical exam 04/30/2015  . History of Roux-en-Y gastric bypass 11/28/2014  . Essential hypertension, benign 11/28/2014    [place], PT 11/22/2020, 3:33 PM  [facility] [place], [place], [address] Phone: [phone]   Fax:  [phone]  Name: NIKEYA MAXIM MRN: [id number] Date of Birth: [birth year]

## 2020-11-22 NOTE — Patient Instructions (Signed)
Access Code: [facility] URL: [email or web address] Date: 11/22/2020 Prepared by: Rudell Cobb  Exercises Straight Leg Raise - 2 x daily - 7 x weekly - 1 sets - 10 reps Single Knee to Chest Stretch - 2 x daily - 7 x weekly - 1 sets - 2 reps - 30-60 seconds hold Supine Hamstring Stretch with Strap - 2 x daily - 7 x weekly - 1 sets - 3 reps - 30-45 seconds hold Supine ITB Stretch with Strap - 2 x daily - 7 x weekly - 1 sets - 3 reps - 30-45 seconds hold Sidelying Hip Abduction - 2 x daily - 7 x weekly - 2 sets - 10 reps Gastroc Stretch on Wall - 2 x daily - 7 x weekly - 1 sets - 2 reps - 30 seconds hold Wall Quarter Squat - 2 x daily - 7 x weekly - 1 sets - 10 reps - 5 seconds hold sitting toe stretch - 2 x daily - 7 x weekly - 1 sets - 5 reps Seated Plantar Fascia Mobilization with Small Ball - 2 x daily - 7 x weekly - 1 sets - 1 reps Roller Massage Elongated IT Band Release - 2 x daily - 7 x weekly - 1 sets - 1 reps - 2 minutes hold

## 2020-11-26 ENCOUNTER — Encounter: Payer: Commercial Managed Care - PPO | Admitting: Rehabilitative and Restorative Service Providers"

## 2020-11-28 ENCOUNTER — Other Ambulatory Visit: Payer: Self-pay

## 2020-11-28 ENCOUNTER — Ambulatory Visit (INDEPENDENT_AMBULATORY_CARE_PROVIDER_SITE_OTHER): Payer: Commercial Managed Care - PPO | Admitting: Rehabilitative and Restorative Service Providers"

## 2020-11-28 DIAGNOSIS — M6281 Muscle weakness (generalized): Secondary | ICD-10-CM | POA: Diagnosis not present

## 2020-11-28 DIAGNOSIS — R29898 Other symptoms and signs involving the musculoskeletal system: Secondary | ICD-10-CM

## 2020-11-28 DIAGNOSIS — M25552 Pain in left hip: Secondary | ICD-10-CM | POA: Diagnosis not present

## 2020-11-28 DIAGNOSIS — Z9889 Other specified postprocedural states: Secondary | ICD-10-CM

## 2020-11-28 NOTE — Patient Instructions (Signed)
Access Code: [facility] URL: [email or web address] Date: 11/28/2020 Prepared by: Rudell Cobb  Exercises Straight Leg Raise - 2 x daily - 7 x weekly - 1 sets - 10 reps Single Knee to Chest Stretch - 2 x daily - 7 x weekly - 1 sets - 2 reps - 30-60 seconds hold Supine Hamstring Stretch with Strap - 2 x daily - 7 x weekly - 1 sets - 3 reps - 30-45 seconds hold Supine ITB Stretch with Strap - 2 x daily - 7 x weekly - 1 sets - 3 reps - 30-45 seconds hold Sidelying Hip Abduction - 2 x daily - 7 x weekly - 2 sets - 10 reps Gastroc Stretch on Wall - 2 x daily - 7 x weekly - 1 sets - 2 reps - 30 seconds hold sitting toe stretch - 2 x daily - 7 x weekly - 1 sets - 5 reps Seated Plantar Fascia Mobilization with Small Ball - 2 x daily - 7 x weekly - 1 sets - 1 reps Roller Massage Elongated IT Band Release - 2 x daily - 7 x weekly - 1 sets - 1 reps - 2 minutes hold

## 2020-11-28 NOTE — Therapy (Signed)
[place], [place], [address] Phone: [phone]   Fax:  [phone]  Physical Therapy Treatment  Patient Details  Name: Denise Gay MRN: [id number] Date of Birth: 1971/07/24 Referring Provider (PT): Phylliss Bob, MD   Encounter Date: 11/28/2020   PT End of Session - 11/28/20 0731    Visit Number 3    Number of Visits 12    Date for PT Re-Evaluation 12/27/20    Authorization Type UHC    PT Start Time 0723    PT Stop Time 0802    PT Time Calculation (min) 39 min    Activity Tolerance Patient tolerated treatment well    Behavior During Therapy [facility] for tasks assessed/performed           Past Medical History:  Diagnosis Date  . Asthma    as a child  . Diabetes mellitus without complication ([place])    was before gastric bypass but patient is not taking any medications for that  . Gastric bypass status for obesity   . History of staph infection    from previous lumbar back surgery  . Hypertension   . Mononucleosis   . Sleep apnea    before gastric bypass no longer wearing CPAP  . Spinal stenosis of lumbar region 01/18/2016    Past Surgical History:  Procedure Laterality Date  . ANTERIOR CERVICAL DECOMP/DISCECTOMY FUSION N/A 05/13/2017   Procedure: ANTERIOR CERVICAL DECOMPRESSION FUSION, CERVICAL 3-4, CERVICAL 4-5 WITH INSTRUMENTATION AND ALLOGRAFT; HARDWARE REMOVAL AT CERVICAL 5-6, CERVICAL 6-7.;  Surgeon: Phylliss Bob, MD;  Location: [place];  Service: Orthopedics;  Laterality: N/A;  ANTERIOR CERVICAL DECOMPRESSION FUSION, CERVICAL 3-4, CERVICAL 4-5 WITH INSTRUMENTATION AND ALLOGRAFT; HARDWARE REMOVAL AT CERVICAL 5-6, CERVICAL 6-  . BREAST REDUCTION SURGERY    . CERVICAL LAMINECTOMY    . GASTRIC BYPASS  08/2011  . lumber surgery    . REDUCTION MAMMAPLASTY      There were no vitals filed for this visit.   Subjective Assessment - 11/28/20 0725    Subjective The patient reports she was  sore after last session in her low back and bilateral hips.  Her right hip has increased pain and she is wondering if she should call her doctor.  PT recommended we work on stretching and pain reduction as she may be sore from last session.    Pertinent History 2017 low back surgery;  2 neck surgeries, gastric bypass, breast reduction, h/o 2 foot surgeries.    Patient Stated Goals No pain in the left hip, improve the weakness in the left leg.    Currently in Pain? Yes    Pain Score 7     Pain Location Hip    Pain Orientation Left    Pain Descriptors / Indicators Sore;Stabbing    Pain Type Acute pain    Pain Onset More than a month ago    Pain Frequency Intermittent    Aggravating Factors  moving, walking    Pain Relieving Factors rest    Multiple Pain Sites Yes    Pain Score 6    Pain Location Back    Pain Orientation Lower    Pain Descriptors / Indicators Shooting;Sore    Pain Type Chronic pain    Pain Onset More than a month ago    Pain Frequency Constant    Aggravating Factors  chronic pain              [facility] PT Assessment -  11/28/20 0732      Assessment   Medical Diagnosis L greater trochanteric bursitis    Referring Provider (PT) Phylliss Bob, MD                         [facility] Adult PT Treatment/Exercise - 11/28/20 0733      Self-Care   Self-Care Other Self-Care Comments    Other Self-Care Comments  patient has been in pain since 2007 since her first surgery; we discussed chronic nature of pain and options such as aquatics.      Exercises   Exercises Knee/Hip;Lumbar      Lumbar Exercises: Stretches   Active Hamstring Stretch Right;Left;2 reps;30 seconds    Active Hamstring Stretch Limitations supine with strap    Lower Trunk Rotation 2 reps;30 seconds    ITB Stretch Right;Left;30 seconds;3 reps    ITB Stretch Limitations sidelying and supine positions    Piriformis Stretch Right;Left;30 seconds;2 reps    Other Lumbar Stretch Exercise hip adductor  stretch standing and standing trunk flexion for posterior chain stretch    Other Lumbar Stretch Exercise standing QL and IT Band stretch      Lumbar Exercises: Aerobic   Nustep L5 x 4 minutes      Lumbar Exercises: Standing   Heel Raises Limitations standing bilat heel and toe raises *heel raises hurt    Other Standing Lumbar Exercises Holding high lunge x 30 seconds x 2 reps right and left      Knee/Hip Exercises: Standing   SLS bilatral LEs near a wall x 10 seconds x 3 reps    Other Standing Knee Exercises Compliant surface standing with eyes open and eyes closed x 30 seconds x 3 reps    Other Standing Knee Exercises alternating step downs posteriorly off 1" foam surface                       PT Long Term Goals - 11/15/20 0930      PT LONG TERM GOAL #1   Title The patient will be indep with HEP.    Time 6    Period Weeks    Target Date 12/27/20      PT LONG TERM GOAL #2   Title The patient will improve functional status score from 38% to > or equal to 61%.    Time 6    Period Weeks    Target Date 12/27/20      PT LONG TERM GOAL #3   Title The patient will improve L hip abduction strength to 4+/5    Time 6    Period Weeks    Target Date 12/27/20      PT LONG TERM GOAL #4   Title The patient will report hip pain < or equal to 2/10 with household activities.    Time 6    Period Weeks    Target Date 12/27/20      PT LONG TERM GOAL #5   Title The patient will verbalize understanding of gym routine (member at [employer] -- not active since prior to pandemic).    Time 6    Period Weeks    Target Date 12/27/20                 Plan - 11/28/20 0757    Clinical Impression Statement The patient had increased pain and R hip pain after last session.  PT modified HEP and activities in  the clinic.  We discussed aquatics as a potential intervention due to h/o chronic pain.  We looked at resources locally to consider for the pool.  Patient to check on pool  access and if able, we will be able to schedule with thrapist in water to educate in pool exercises.    PT Treatment/Interventions ADLs/Self Care Home Management;Taping;Patient/family education;Dry needling;Manual techniques;Therapeutic exercise;Therapeutic activities;Gait training;Stair training;Electrical Stimulation;Cryotherapy;Iontophoresis 4mg /ml Dexamethasone;Moist Heat    PT Next Visit Plan self STM with foam roller, consider iontophoresis over hip (currently seems like a larger area), focus on glut/core/hip strengthening progressing loading activities    PT [place] and Agree with Plan of Care Patient           Patient will benefit from skilled therapeutic intervention in order to improve the following deficits and impairments:  Pain,Impaired flexibility,Decreased strength,Increased fascial restricitons,Decreased range of motion,Postural dysfunction  Visit Diagnosis: Pain in left hip  Muscle weakness (generalized)  Other symptoms and signs involving the musculoskeletal system     Problem List Patient Active Problem List   Diagnosis Date Noted  . Allergic rhinitis 02/03/2020  . Hot flashes due to menopause 09/05/2019  . Postmenopausal hormone therapy 09/05/2019  . Radiculopathy 05/13/2017  . Otitis media 09/01/2016  . Obesity 03/10/2016  . Spinal stenosis of lumbar region 01/18/2016  . Progressive focal motor weakness 01/15/2016  . H/O C3-C7 Fusion 04/30/2015  . Annual physical exam 04/30/2015  . History of Roux-en-Y gastric bypass 11/28/2014  . Essential hypertension, benign 11/28/2014    Kishia Shackett, PT 11/28/2020, 8:55 AM  [facility] [place], [place], [address] Phone: [phone]   Fax:  [phone]  Name: Denise Gay MRN: [id number] Date of Birth: Jul 07, 1971

## 2020-11-29 ENCOUNTER — Other Ambulatory Visit: Payer: Self-pay

## 2020-11-29 DIAGNOSIS — Z9889 Other specified postprocedural states: Secondary | ICD-10-CM

## 2020-11-29 MED ORDER — TRAMADOL HCL 50 MG PO TABS: ORAL_TABLET | ORAL | 0 refills | Status: DC

## 2020-11-30 ENCOUNTER — Other Ambulatory Visit: Payer: Self-pay

## 2020-11-30 ENCOUNTER — Ambulatory Visit (INDEPENDENT_AMBULATORY_CARE_PROVIDER_SITE_OTHER): Payer: Commercial Managed Care - PPO

## 2020-11-30 ENCOUNTER — Ambulatory Visit: Payer: Commercial Managed Care - PPO | Admitting: Sports Medicine

## 2020-11-30 DIAGNOSIS — M7061 Trochanteric bursitis, right hip: Secondary | ICD-10-CM

## 2020-11-30 NOTE — Addendum Note (Signed)
Addended by: Silverio Decamp on: 11/30/2020 04:27 PM   Modules accepted: Orders

## 2020-11-30 NOTE — Progress Notes (Addendum)
    Procedures performed today:    Procedure: Real-time Ultrasound Guided injection of the right trochanteric bursa Device: Samsung HS60  Verbal informed consent obtained.  Time-out conducted.  Noted no overlying erythema, induration, or other signs of local infection.  Skin prepped in a sterile fashion.  Local anesthesia: Topical Ethyl chloride.  With sterile technique and under real time ultrasound guidance: Noted normal appearing hip abductors 1 cc Kenalog 40, 2 cc lidocaine, 2 cc bupivacaine injected easily Completed without difficulty  Advised to call if fevers/chills, erythema, induration, drainage, or persistent bleeding.  Images permanently stored and available for review in PACS.  Impression: Technically successful ultrasound guided injection.  Independent interpretation of notes and tests performed by another provider:   None.  Brief History, Exam, Impression, and Recommendations:    Trochanteric bursitis, right hip Increasing pain in the right lateral hip, significant weakness to hip abductors. Getting some x-rays. Trochanteric bursa injection today per patient request, adding hip abductor rehabilitation and strengthening to physical therapy, return to see me in a month.    ___________________________________________ Gwen Her. Dianah Field, M.D., ABFM., CAQSM. Primary Care and [place] Instructor of [place] [employer] [facility] of Medicine

## 2020-11-30 NOTE — Assessment & Plan Note (Addendum)
Increasing pain in the right lateral hip, significant weakness to hip abductors. Getting some x-rays. Trochanteric bursa injection today per patient request, adding hip abductor rehabilitation and strengthening to physical therapy, return to see me in a month.

## 2020-12-03 ENCOUNTER — Encounter: Payer: Self-pay | Admitting: Rehabilitative and Restorative Service Providers"

## 2020-12-03 ENCOUNTER — Ambulatory Visit: Payer: Commercial Managed Care - PPO | Admitting: Rehabilitative and Restorative Service Providers"

## 2020-12-03 ENCOUNTER — Other Ambulatory Visit: Payer: Self-pay

## 2020-12-03 DIAGNOSIS — R29898 Other symptoms and signs involving the musculoskeletal system: Secondary | ICD-10-CM | POA: Diagnosis not present

## 2020-12-03 DIAGNOSIS — M25552 Pain in left hip: Secondary | ICD-10-CM

## 2020-12-03 DIAGNOSIS — M6281 Muscle weakness (generalized): Secondary | ICD-10-CM

## 2020-12-03 NOTE — Patient Instructions (Signed)
   Aquatic Therapy: What to Expect!  Where:  [facility] at [address] [place], [place] [address] [phone]           How to Prepare: . Please make sure you drink 8 ounces of water about one hour prior to your pool session . A caregiver must attend the entire session with the patient (unless your primary therapists feels this is not necessary). The caregiver will be responsible for assisting with dressing as well as any toileting needs.  . Please arrive IN YOUR SUIT and a few minutes prior to your appointment - this helps to avoid delays in starting your session. . Please make sure to attend to any toileting needs prior to entering the pool. . Once on the pool deck your therapist will ask you to sign the Patient  Consent and Assignment of Benefits form. . Your therapist may take your blood pressure prior to, during and after your session if indicated. . We usually try and create a home exercise program based on activities we do in the pool.  Please be thinking about who might be able to assist you in the pool should you want to participate in an aquatic home exercise program at the time of discharge.  Some patients do not want to or do not have the ability to participate in an aquatic home program - this is not a barrier in any way to you participating in aquatic therapy as part of your current therapy plan!  Appointments:  All sessions are 45 minutes  About the pool: 1. Entering the pool Your therapist will assist you; there are two ways to enter the pool - stairs or a mechanical lift. Your therapist will determine the most appropriate way for you. 2. Water temperature is usually around 92.  There is a lap pool with a temperature around 84 3. There may be other swimmers in the pool at the same time.   Contact Info:             To cancel appointment, please call [facility] at [phone] If you are running late, please call  [facility] at [phone]                    

## 2020-12-03 NOTE — Therapy (Signed)
[place], [place], [address] Phone: [phone]   Fax:  [phone]  Physical Therapy Treatment  Patient Details  Name: Denise Gay MRN: [id number] Date of Birth: [birth year] Referring Provider (PT): Phylliss Bob, MD   Encounter Date: 12/03/2020   PT End of Session - 12/03/20 0722    Visit Number 4    Number of Visits 12    Date for PT Re-Evaluation 12/27/20    Authorization Type UHC    PT Start Time 0718    PT Stop Time 0800    PT Time Calculation (min) 42 min    Activity Tolerance Patient tolerated treatment well    Behavior During Therapy [facility] for tasks assessed/performed           Past Medical History:  Diagnosis Date  . Asthma    as a child  . Diabetes mellitus without complication ([place])    was before gastric bypass but patient is not taking any medications for that  . Gastric bypass status for obesity   . History of staph infection    from previous lumbar back surgery  . Hypertension   . Mononucleosis   . Sleep apnea    before gastric bypass no longer wearing CPAP  . Spinal stenosis of lumbar region 01/18/2016    Past Surgical History:  Procedure Laterality Date  . ANTERIOR CERVICAL DECOMP/DISCECTOMY FUSION N/A 05/13/2017   Procedure: ANTERIOR CERVICAL DECOMPRESSION FUSION, CERVICAL 3-4, CERVICAL 4-5 WITH INSTRUMENTATION AND ALLOGRAFT; HARDWARE REMOVAL AT CERVICAL 5-6, CERVICAL 6-7.;  Surgeon: Phylliss Bob, MD;  Location: [facility];  Service: Orthopedics;  Laterality: N/A;  ANTERIOR CERVICAL DECOMPRESSION FUSION, CERVICAL 3-4, CERVICAL 4-5 WITH INSTRUMENTATION AND ALLOGRAFT; HARDWARE REMOVAL AT CERVICAL 5-6, CERVICAL 6-  . BREAST REDUCTION SURGERY    . CERVICAL LAMINECTOMY    . GASTRIC BYPASS  08/2011  . lumber surgery    . REDUCTION MAMMAPLASTY      There were no vitals filed for this visit.   Subjective Assessment - 12/03/20 0721    Subjective The patient reports her R  hip feels better since getting an injection.    Pertinent History 2017 low back surgery;  2 neck surgeries, gastric bypass, breast reduction, h/o 2 foot surgeries.    Patient Stated Goals No pain in the left hip, improve the weakness in the left leg.    Currently in Pain? Yes    Pain Score 4     Pain Location Hip    Pain Orientation Right;Left    Pain Descriptors / Indicators Sore    Pain Type Acute pain    Pain Onset 1 to 4 weeks ago    Pain Frequency Intermittent    Aggravating Factors  moving, walking    Pain Relieving Factors rest              [facility] PT Assessment - 12/03/20 0724      Assessment   Medical Diagnosis L greater trochanteric bursitis    Referring Provider (PT) Phylliss Bob, MD                         [facility] Adult PT Treatment/Exercise - 12/03/20 0725      Exercises   Exercises Knee/Hip;Lumbar      Lumbar Exercises: Stretches   Active Hamstring Stretch Right;Left;1 rep;30 seconds    ITB Stretch Right;Left;30 seconds      Lumbar Exercises: Aerobic   Nustep L5 x  4 minutes      Lumbar Exercises: Sidelying   Clam Right;Left;10 reps    Hip Abduction Right;Left;10 reps    Other Sidelying Lumbar Exercises reverse clam x 10 reps      Knee/Hip Exercises: Standing   Hip Abduction Stengthening;Left;Right;10 reps    Abduction Limitations bilateral sidestepping x 10 reps x 2 sets    Forward Step Up Left;Right;10 reps    Wall Squat 10 reps    SLS bilatral LEs near a wall x 10 seconds x 3 reps    Other Standing Knee Exercises Compliant surface standing with eyes open and eyes closed x 30 seconds x 3 reps      Manual Therapy   Manual Therapy Soft tissue mobilization;Myofascial release    Manual therapy comments to reduce soft tissue tightness and reduce pain    Soft tissue mobilization IT band R and L, lateral thigh (quad and medial HS)    Myofascial Release IT Band                       PT Long Term Goals - 11/15/20 0930      PT  LONG TERM GOAL #1   Title The patient will be indep with HEP.    Time 6    Period Weeks    Target Date 12/27/20      PT LONG TERM GOAL #2   Title The patient will improve functional status score from 38% to > or equal to 61%.    Time 6    Period Weeks    Target Date 12/27/20      PT LONG TERM GOAL #3   Title The patient will improve L hip abduction strength to 4+/5    Time 6    Period Weeks    Target Date 12/27/20      PT LONG TERM GOAL #4   Title The patient will report hip pain < or equal to 2/10 with household activities.    Time 6    Period Weeks    Target Date 12/27/20      PT LONG TERM GOAL #5   Title The patient will verbalize understanding of gym routine (member at [employer] -- not active since prior to pandemic).    Time 6    Period Weeks    Target Date 12/27/20                 Plan - 12/03/20 1258    Clinical Impression Statement The patient is tolerating hip strengthening well today.  The injection reduced R hip pain.  PT and patient discussed aquatics as a great option for strengthening and scheduled her for 2/25 and 3/11 for pool with PTA in order to learn activities for aquatics.  PT to continue to patient tolerance.    PT Treatment/Interventions ADLs/Self Care Home Management;Taping;Patient/family education;Dry needling;Manual techniques;Therapeutic exercise;Therapeutic activities;Gait training;Stair training;Electrical Stimulation;Cryotherapy;Iontophoresis 4mg /ml Dexamethasone;Moist Heat    PT Next Visit Plan continue STM lateral thighs / consider DN if patient willing (has some prior history and not sure of DN), focus on glut/core/hip strengthening and progressing load to tolerance *chronic h/o foot pain    PT Home Exercise Plan [facility]    Consulted and Agree with Plan of Care Patient           Patient will benefit from skilled therapeutic intervention in order to improve the following deficits and impairments:     Visit Diagnosis: Pain in  left hip  Muscle weakness (generalized)  Other symptoms and signs involving the musculoskeletal system     Problem List Patient Active Problem List   Diagnosis Date Noted  . Trochanteric bursitis, right hip 11/30/2020  . Allergic rhinitis 02/03/2020  . Hot flashes due to menopause 09/05/2019  . Postmenopausal hormone therapy 09/05/2019  . Radiculopathy 05/13/2017  . Otitis media 09/01/2016  . Obesity 03/10/2016  . Spinal stenosis of lumbar region 01/18/2016  . Progressive focal motor weakness 01/15/2016  . H/O C3-C7 Fusion 04/30/2015  . Annual physical exam 04/30/2015  . History of Roux-en-Y gastric bypass 11/28/2014  . Essential hypertension, benign 11/28/2014    Protection, PT 12/03/2020, 1:01 PM  [facility] [place], [place], [address] Phone: [phone]   Fax:  [phone]  Name: Denise Gay MRN: [id number] Date of Birth: Jul 02, 1971

## 2020-12-05 ENCOUNTER — Ambulatory Visit (INDEPENDENT_AMBULATORY_CARE_PROVIDER_SITE_OTHER): Payer: Commercial Managed Care - PPO | Admitting: Rehabilitative and Restorative Service Providers"

## 2020-12-05 ENCOUNTER — Other Ambulatory Visit: Payer: Self-pay

## 2020-12-05 DIAGNOSIS — R29898 Other symptoms and signs involving the musculoskeletal system: Secondary | ICD-10-CM | POA: Diagnosis not present

## 2020-12-05 DIAGNOSIS — M25552 Pain in left hip: Secondary | ICD-10-CM | POA: Diagnosis not present

## 2020-12-05 DIAGNOSIS — M6281 Muscle weakness (generalized): Secondary | ICD-10-CM

## 2020-12-05 NOTE — Patient Instructions (Signed)
Access Code: [facility] URL: [email or web address] Date: 12/05/2020 Prepared by: Rudell Cobb  Exercises Straight Leg Raise - 2 x daily - 7 x weekly - 1 sets - 10 reps Single Knee to Chest Stretch - 2 x daily - 7 x weekly - 1 sets - 2 reps - 30-60 seconds hold Supine Hamstring Stretch with Strap - 2 x daily - 7 x weekly - 1 sets - 3 reps - 30-45 seconds hold Supine ITB Stretch with Strap - 2 x daily - 7 x weekly - 1 sets - 3 reps - 30-45 seconds hold Sidelying Hip Abduction - 2 x daily - 7 x weekly - 2 sets - 10 reps Gastroc Stretch on Wall - 2 x daily - 7 x weekly - 1 sets - 2 reps - 30 seconds hold Seated Plantar Fascia Mobilization with Small Ball - 2 x daily - 7 x weekly - 1 sets - 1 reps Seated Ankle Plantarflexion with Resistance - 2 x daily - 7 x weekly - 1 sets - 10 reps Roller Massage Elongated IT Band Release - 2 x daily - 7 x weekly - 1 sets - 1 reps - 2 minutes hold

## 2020-12-05 NOTE — Therapy (Signed)
[place], [place], [address] Phone: [phone]   Fax:  [phone]  Physical Therapy Treatment  Patient Details  Name: Denise Gay MRN: [id number] Date of Birth: February 11, 1971 Referring Provider (PT): Phylliss Bob, MD   Encounter Date: 12/05/2020   PT End of Session - 12/05/20 0721    Visit Number 5    Number of Visits 12    Date for PT Re-Evaluation 12/27/20    Authorization Type U[employer]    PT Start Time 0717    PT Stop Time 0800    PT Time Calculation (min) 43 min    Activity Tolerance Patient tolerated treatment well    Behavior During Therapy [facility] for tasks assessed/performed           Past Medical History:  Diagnosis Date  . Asthma    as a child  . Diabetes mellitus without complication ([place])    was before gastric bypass but patient is not taking any medications for that  . Gastric bypass status for obesity   . History of staph infection    from previous lumbar back surgery  . Hypertension   . Mononucleosis   . Sleep apnea    before gastric bypass no longer wearing CPAP  . Spinal stenosis of lumbar region 01/18/2016    Past Surgical History:  Procedure Laterality Date  . ANTERIOR CERVICAL DECOMP/DISCECTOMY FUSION N/A 05/13/2017   Procedure: ANTERIOR CERVICAL DECOMPRESSION FUSION, CERVICAL 3-4, CERVICAL 4-5 WITH INSTRUMENTATION AND ALLOGRAFT; HARDWARE REMOVAL AT CERVICAL 5-6, CERVICAL 6-7.;  Surgeon: Phylliss Bob, MD;  Location: [place];  Service: Orthopedics;  Laterality: N/A;  ANTERIOR CERVICAL DECOMPRESSION FUSION, CERVICAL 3-4, CERVICAL 4-5 WITH INSTRUMENTATION AND ALLOGRAFT; HARDWARE REMOVAL AT CERVICAL 5-6, CERVICAL 6-  . BREAST REDUCTION SURGERY    . CERVICAL LAMINECTOMY    . GASTRIC BYPASS  08/2011  . lumber surgery    . REDUCTION MAMMAPLASTY      There were no vitals filed for this visit.   Subjective Assessment - 12/05/20 0719    Subjective The patient reports her R  hip continues to feel better.  Her back is "always sore."    Pertinent History 2017 low back surgery;  2 neck surgeries, gastric bypass, breast reduction, h/o 2 foot surgeries.    Patient Stated Goals No pain in the left hip, improve the weakness in the left leg.    Currently in Pain? Yes    Pain Score 0-No pain   no hip pain this morning   Pain Location Hip    Pain Score 6    Pain Location Back    Pain Orientation Lower    Pain Descriptors / Indicators Shooting;Sore              [facility] PT Assessment - 12/05/20 0722      Assessment   Medical Diagnosis L greater trochanteric bursitis    Referring Provider (PT) Phylliss Bob, MD                         [facility] Adult PT Treatment/Exercise - 12/05/20 3474      Ambulation/Gait   Ambulation/Gait Yes    Gait Comments resisted walking anterior/posterior and sidestepping      Exercises   Exercises Knee/Hip;Lumbar      Lumbar Exercises: Stretches   Other Lumbar Stretch Exercise QL stretch in standing      Lumbar Exercises: Aerobic   Nustep Level 5 x  3.5 minutes LEs only      Lumbar Exercises: Sidelying   Clam Right;Left;10 reps    Other Sidelying Lumbar Exercises reverse clam x 10 reps      Lumbar Exercises: Quadruped   [place]/Old Horse 10 reps    Straight Leg Raise 10 reps    Plank with knees holding 5 seconds x 8 reps      Knee/Hip Exercises: Standing   Heel Raises Both    Heel Raises Limitations attempted off edge of step-- not painful, but weak and unable to perform; tried on solid surface with difficulty moving anti-gravity    Forward Step Up Left;Right;10 reps    Forward Step Up Limitations with UE support    Other Standing Knee Exercises compliant surface standing on BOSU ball with static holds needing UE support, then rocker board standing with lateral weight shifts and alternating UE support holding middle      Knee/Hip Exercises: Seated   Other Seated Knee/Hip Exercises PF into blue t-band added to  HEP x 15 reps R and L sides                  PT Education - 12/05/20 0758    Education Details HEP    Person(s) Educated Patient    Methods Explanation;Demonstration;Handout    Comprehension Verbalized understanding;Returned demonstration               PT Long Term Goals - 11/15/20 0930      PT LONG TERM GOAL #1   Title The patient will be indep with HEP.    Time 6    Period Weeks    Target Date 12/27/20      PT LONG TERM GOAL #2   Title The patient will improve functional status score from 38% to > or equal to 61%.    Time 6    Period Weeks    Target Date 12/27/20      PT LONG TERM GOAL #3   Title The patient will improve L hip abduction strength to 4+/5    Time 6    Period Weeks    Target Date 12/27/20      PT LONG TERM GOAL #4   Title The patient will report hip pain < or equal to 2/10 with household activities.    Time 6    Period Weeks    Target Date 12/27/20      PT LONG TERM GOAL #5   Title The patient will verbalize understanding of gym routine (member at [employer] -- not active since prior to pandemic).    Time 6    Period Weeks    Target Date 12/27/20                 Plan - 12/05/20 0758    Clinical Impression Statement The patient has dec'd hip pain this week.  PT is progressing strengthening activities to patient tolerance focusing on hip strength, core strength and working on foot/ankle ROM to improve gait mechanics.  Plan to continue to patient tolerance.    PT Treatment/Interventions ADLs/Self Care Home Management;Taping;Patient/family education;Dry needling;Manual techniques;Therapeutic exercise;Therapeutic activities;Gait training;Stair training;Electrical Stimulation;Cryotherapy;Iontophoresis 4mg /ml Dexamethasone;Moist Heat    PT Next Visit Plan continue STM lateral thighs / consider DN if patient willing (has some prior history and not sure of DN), focus on glut/core/hip strengthening and progressing load to tolerance  *chronic h/o foot pain    PT Home Exercise Plan [facility]    Consulted and Agree with  Plan of Care Patient           Patient will benefit from skilled therapeutic intervention in order to improve the following deficits and impairments:     Visit Diagnosis: Pain in left hip  Muscle weakness (generalized)  Other symptoms and signs involving the musculoskeletal system     Problem List Patient Active Problem List   Diagnosis Date Noted  . Trochanteric bursitis, right hip 11/30/2020  . Allergic rhinitis 02/03/2020  . Hot flashes due to menopause 09/05/2019  . Postmenopausal hormone therapy 09/05/2019  . Radiculopathy 05/13/2017  . Otitis media 09/01/2016  . Obesity 03/10/2016  . Spinal stenosis of lumbar region 01/18/2016  . Progressive focal motor weakness 01/15/2016  . H/O C3-C7 Fusion 04/30/2015  . Annual physical exam 04/30/2015  . History of Roux-en-Y gastric bypass 11/28/2014  . Essential hypertension, benign 11/28/2014    [place], PT 12/05/2020, 10:10 AM  [facility] [place] [facility] [place], [place], [address] Phone: [phone]   Fax:  [phone]  Name: KAMIKA GOODLOE MRN: [id number] Date of Birth: [birth year]

## 2020-12-10 ENCOUNTER — Encounter: Payer: Commercial Managed Care - PPO | Admitting: Rehabilitative and Restorative Service Providers"

## 2020-12-11 ENCOUNTER — Other Ambulatory Visit: Payer: Self-pay

## 2020-12-11 ENCOUNTER — Emergency Department
Admission: EM | Admit: 2020-12-11 | Discharge: 2020-12-11 | Disposition: A | Discharge status: Home / Self Care | Payer: Commercial Managed Care - PPO | Source: Home / Self Care | Attending: Family Medicine | Admitting: Family Medicine

## 2020-12-11 DIAGNOSIS — Z9889 Other specified postprocedural states: Secondary | ICD-10-CM | POA: Diagnosis not present

## 2020-12-11 MED ORDER — TRAMADOL HCL 50 MG PO TABS: ORAL_TABLET | ORAL | 0 refills | Status: DC

## 2020-12-11 NOTE — Discharge Instructions (Addendum)
Be sure to stretch and exercise every day Tramadol is refilled Follow up with Dr Darene Lamer

## 2020-12-11 NOTE — ED Triage Notes (Signed)
Patient presents to Urgent Care with complaints of lower back pain, centralized but slightly worse on the right side since yesterday. Patient reports she sees sports medicine for bursitis, had an injection about a week and a half ago for the pain. Ran out of her script for the pain 3 days ago. Pt goes to PT for the pain as well.

## 2020-12-11 NOTE — ED Provider Notes (Signed)
[facility]    CSN: [id number] Arrival date & time: 12/11/20  1444      History   Chief Complaint Chief Complaint  Patient presents with  . Back Pain    HPI Denise Gay is a 50 y.o. female.   HPI  This is a 50 year old woman who has chronic orthopedic problems, right trochanteric bursitis.  Chronic low back pain.  History of lumbar disc surgery.  History of neck surgery.  Currently obtaining medical care through sports medicine.  She also has specialty care.  She is on Celebrex as an anti-inflammatory, states she is also taking a muscle relaxer does not recall the name of it.  Previously was on Lyrica and Neurontin but states neither of these helped her.  She takes tramadol for pain.  States she ran out of tramadol 4 days ago.  States that today her pain is severe.  Has had no accident or injury, no change in her activity.  She is still actively engaged in physical therapy.  Past Medical History:  Diagnosis Date  . Asthma    as a child  . Diabetes mellitus without complication ([place])    was before gastric bypass but patient is not taking any medications for that  . Gastric bypass status for obesity   . History of staph infection    from previous lumbar back surgery  . Hypertension   . Mononucleosis   . Sleep apnea    before gastric bypass no longer wearing CPAP  . Spinal stenosis of lumbar region 01/18/2016    Patient Active Problem List   Diagnosis Date Noted  . Trochanteric bursitis, right hip 11/30/2020  . Allergic rhinitis 02/03/2020  . Hot flashes due to menopause 09/05/2019  . Postmenopausal hormone therapy 09/05/2019  . Radiculopathy 05/13/2017  . Otitis media 09/01/2016  . Obesity 03/10/2016  . Spinal stenosis of lumbar region 01/18/2016  . Progressive focal motor weakness 01/15/2016  . H/O C3-C7 Fusion 04/30/2015  . Annual physical exam 04/30/2015  . History of Roux-en-Y gastric bypass 11/28/2014  . Essential hypertension, benign 11/28/2014     Past Surgical History:  Procedure Laterality Date  . ANTERIOR CERVICAL DECOMP/DISCECTOMY FUSION N/A 05/13/2017   Procedure: ANTERIOR CERVICAL DECOMPRESSION FUSION, CERVICAL 3-4, CERVICAL 4-5 WITH INSTRUMENTATION AND ALLOGRAFT; HARDWARE REMOVAL AT CERVICAL 5-6, CERVICAL 6-7.;  Surgeon: Phylliss Bob, MD;  Location: [place];  Service: Orthopedics;  Laterality: N/A;  ANTERIOR CERVICAL DECOMPRESSION FUSION, CERVICAL 3-4, CERVICAL 4-5 WITH INSTRUMENTATION AND ALLOGRAFT; HARDWARE REMOVAL AT CERVICAL 5-6, CERVICAL 6-  . BREAST REDUCTION SURGERY    . CERVICAL LAMINECTOMY    . GASTRIC BYPASS  08/2011  . lumber surgery    . REDUCTION MAMMAPLASTY      OB History    Gravida  2   Para  0   Term      Preterm      AB  2   Living        SAB      IAB  2   Ectopic      Multiple      Live Births               Home Medications    Prior to Admission medications   Medication Sig Start Date End Date Taking? Authorizing Provider  topiramate (TOPAMAX) 50 MG tablet Take 100 mg by mouth at bedtime.   Yes [provider]  Biotin 5000 MCG SUBL Place 1 tablet under the tongue daily.  [provider]  celecoxib (CELEBREX) 200 MG capsule TAKE 1 TO 2 TABLETS BY MOUTH DAILY AS NEEDED FOR PAIN. 04/30/20   Silverio Decamp, MD  fluticasone Asencion Islam) 50 MCG/ACT nasal spray One spray in each nostril twice a day 02/03/20   Silverio Decamp, MD  levocetirizine (XYZAL) 5 MG tablet Take 1 tablet (5 mg total) by mouth every evening. 07/23/20   Silverio Decamp, MD  losartan (COZAAR) 100 MG tablet TAKE 1 TABLET BY MOUTH EVERY DAY 10/26/20   Silverio Decamp, MD  Multiple Vitamins-Minerals (MULTIVITAMIN WITH MINERALS) tablet Take 1 tablet by mouth daily.    [provider]  PARoxetine (PAXIL) 30 MG tablet Take 30 mg by mouth daily.    [provider]  QUEtiapine (SEROQUEL) 200 MG tablet Take 200 mg by mouth at bedtime. 07/27/20   [provider]  sertraline (ZOLOFT) 100 MG tablet Take 200 mg by mouth daily. 07/10/20   [provider]  traMADol (ULTRAM) 50 MG tablet TAKE 1 TABLET BY MOUTH EVERY 8 HOURS AS NEEDED FOR MODERATE PAIN. MAX 6 TABLETS DAILY. 12/11/20   Raylene Everts, MD  gabapentin (NEURONTIN) 600 MG tablet Take 1 tablet (600 mg total) by mouth at bedtime. Patient not taking: Reported on 11/15/2020 09/05/19 12/11/20  Rasch, Anderson Malta I, NP  pregabalin (LYRICA) 150 MG capsule Take 1 capsule (150 mg total) by mouth in the morning, at noon, and at bedtime. 08/20/20 12/11/20  Silverio Decamp, MD    Family History Family History  Problem Relation Age of Onset  . Diabetes Mother   . Hypertension Father   . Diabetes Brother   . Diabetes Maternal Grandmother     Social History Social History   Tobacco Use  . Smoking status: Former Smoker    Quit date: 12/18/2005    Years since quitting: 14.9  . Smokeless tobacco: Never Used  Vaping Use  . Vaping Use: Never used  Substance Use Topics  . Alcohol use: No    Alcohol/week: 0.0 standard drinks  . Drug use: No     Allergies   Patient has no known allergies.   Review of Systems Review of Systems See HPI  Physical Exam Triage Vital Signs ED Triage Vitals  Enc Vitals Group     BP 12/11/20 1457 133/81     Pulse Rate 12/11/20 1457 73     Resp 12/11/20 1457 16     Temp 12/11/20 1457 99 F (37.2 C)     Temp Source 12/11/20 1457 Oral     SpO2 12/11/20 1457 97 %     Weight --      Height --      Head Circumference --      Peak Flow --      Pain Score 12/11/20 1455 9     Pain Loc --      Pain Edu? --      Excl. in [place]? --    No data found.  Updated Vital Signs BP 133/81 (BP Location: Right Arm)   Pulse 73   Temp 99 F (37.2 C) (Oral)   Resp 16   SpO2 97%        Physical Exam Constitutional:      General: She is not in acute distress.    Appearance: She is well-developed and well-nourished.     Comments: Overweight.   Appears mildly uncomfortable  HENT:     Head: Normocephalic and atraumatic.  Mouth/Throat:     Mouth: Oropharynx is clear and moist.     Comments: Mask is in place Eyes:     Conjunctiva/sclera: Conjunctivae normal.     Pupils: Pupils are equal, round, and reactive to light.  Cardiovascular:     Rate and Rhythm: Normal rate.  Pulmonary:     Effort: Pulmonary effort is normal. No respiratory distress.  Abdominal:     General: There is no distension.     Palpations: Abdomen is soft.  Musculoskeletal:        General: No edema. Normal range of motion.     Cervical back: Normal range of motion.     Comments: Patient can forward flex lumbar spine about 50% normal, fingertips to touching me.  Slow but full lateral and rotary movement.  She can stand on her heels and toes and do a partial deep knee bend.  Strength is symmetric.  Reflexes are diminished but symmetric.  Straight leg raise is negative bilaterally.  Skin:    General: Skin is warm and dry.  Neurological:     General: No focal deficit present.     Mental Status: She is alert.  Psychiatric:        Behavior: Behavior normal.      UC Treatments / Results  Labs (all labs ordered are listed, but only abnormal results are displayed) Labs Reviewed - No data to display  EKG   Radiology No results found.  Procedures Procedures (including critical care time)  Medications Ordered in UC Medications - No data to display  Initial Impression / Assessment and Plan / UC Course  I have reviewed the triage vital signs and the nursing notes.  Pertinent labs & imaging results that were available during my care of the patient were reviewed by me and considered in my medical decision making (see chart for details).     I spoke with Dr. Dianah Field, her sports medicine physician.  He states that it is fine to give her more tramadol, she has not exhibited any drug-seeking behavior.  She is encouraged to continue with her current  therapy including exercise and physical therapy Final Clinical Impressions(s) / UC Diagnoses   Final diagnoses:  H/O C3-C7 Fusion     Discharge Instructions     Be sure to stretch and exercise every day Tramadol is refilled Follow up with Dr T   ED Prescriptions    Medication Sig Dispense Auth. Provider   traMADol (ULTRAM) 50 MG tablet TAKE 1 TABLET BY MOUTH EVERY 8 HOURS AS NEEDED FOR MODERATE PAIN. MAX 6 TABLETS DAILY. 30 tablet Raylene Everts, MD     I have reviewed the PDMP during this encounter.   Raylene Everts, MD 12/11/20 [phone]

## 2020-12-12 ENCOUNTER — Other Ambulatory Visit: Payer: Self-pay

## 2020-12-12 ENCOUNTER — Encounter: Payer: Commercial Managed Care - PPO | Admitting: Rehabilitative and Restorative Service Providers"

## 2020-12-12 ENCOUNTER — Ambulatory Visit (INDEPENDENT_AMBULATORY_CARE_PROVIDER_SITE_OTHER): Payer: Commercial Managed Care - PPO | Admitting: Sports Medicine

## 2020-12-12 DIAGNOSIS — M48061 Spinal stenosis, lumbar region without neurogenic claudication: Secondary | ICD-10-CM | POA: Diagnosis not present

## 2020-12-12 MED ORDER — HYDROCODONE-ACETAMINOPHEN 10-325 MG PO TABS: 1.0000 | ORAL_TABLET | Freq: Three times a day (TID) | ORAL | 0 refills | Status: DC | PRN

## 2020-12-12 MED ORDER — PREDNISONE 50 MG PO TABS: ORAL_TABLET | ORAL | 0 refills | Status: DC

## 2020-12-12 NOTE — Progress Notes (Signed)
    Procedures performed today:    None.  Independent interpretation of notes and tests performed by another provider:   None.  Brief History, Exam, Impression, and Recommendations:    Spinal stenosis of lumbar region This is a pleasant 50 year old female, she has known L3-L4 lumbar spinal stenosis, we have treated her with multiple modalities including physical therapy, neuropathic agents, steroids, she has had left L3-L4 epidural without much improvement, we have tried trochanteric bursa injections without improvement, she was recently seen in urgent care necessitating narcotic medication. She is complaining of weakness in her legs, because of failure of multiple conservative modalities I do think we need to surgical opinion from Dr. Lynann Bologna, I am going to give her some additional prednisone and hydrocodone to hold her over in the meantime.    ___________________________________________ Gwen Her. Dianah Field, M.D., ABFM., CAQSM. Primary Care and [place] Instructor of [place] [facility]

## 2020-12-12 NOTE — Assessment & Plan Note (Signed)
This is a pleasant 50 year old female, she has known L3-L4 lumbar spinal stenosis, we have treated her with multiple modalities including physical therapy, neuropathic agents, steroids, she has had left L3-L4 epidural without much improvement, we have tried trochanteric bursa injections without improvement, she was recently seen in urgent care necessitating narcotic medication. She is complaining of weakness in her legs, because of failure of multiple conservative modalities I do think we need to surgical opinion from Dr. Lynann Bologna, I am going to give her some additional prednisone and hydrocodone to hold her over in the meantime.

## 2020-12-14 ENCOUNTER — Other Ambulatory Visit: Payer: Self-pay

## 2020-12-14 ENCOUNTER — Encounter: Payer: Self-pay | Admitting: Physical Therapy

## 2020-12-14 ENCOUNTER — Ambulatory Visit (INDEPENDENT_AMBULATORY_CARE_PROVIDER_SITE_OTHER): Payer: Commercial Managed Care - PPO | Admitting: Physical Therapy

## 2020-12-14 DIAGNOSIS — M6281 Muscle weakness (generalized): Secondary | ICD-10-CM | POA: Diagnosis not present

## 2020-12-14 DIAGNOSIS — M25552 Pain in left hip: Secondary | ICD-10-CM

## 2020-12-14 DIAGNOSIS — R29898 Other symptoms and signs involving the musculoskeletal system: Secondary | ICD-10-CM

## 2020-12-14 NOTE — Therapy (Addendum)
[place] [address] [place], [place], [address] Phone: [phone]   Fax:  [phone]  Physical Therapy Treatment and Discharge Summary  Patient Details  Name: Denise Gay MRN: [id number] Date of Birth: Oct 25, 1970 Referring Provider (PT): Phylliss Bob, MD  PHYSICAL THERAPY DISCHARGE SUMMARY  Visits from Start of Care: 6  Current functional level related to goals / functional outcomes: *patient requested to hold due to continued LBP and referral to neurosurgeon   Remaining deficits: See note below for last known patient status.   Education / Equipment: HEP  Plan: Patient agrees to discharge.  Patient goals were not met. Patient is being discharged due to the patient's request.  ?????         Thank you for the referral of this patient. Rudell Cobb, MPT   Encounter Date: 12/14/2020   PT End of Session - 12/14/20 1702    Visit Number 6    Number of Visits 12    Date for PT Re-Evaluation 12/27/20    Authorization Type UHC    PT Start Time 1230    PT Stop Time 1315    PT Time Calculation (min) 45 min    Activity Tolerance Patient tolerated treatment well    Behavior During Therapy WFL for tasks assessed/performed           Past Medical History:  Diagnosis Date  . Asthma    as a child  . Diabetes mellitus without complication ([place])    was before gastric bypass but patient is not taking any medications for that  . Gastric bypass status for obesity   . History of staph infection    from previous lumbar back surgery  . Hypertension   . Mononucleosis   . Sleep apnea    before gastric bypass no longer wearing CPAP  . Spinal stenosis of lumbar region 01/18/2016    Past Surgical History:  Procedure Laterality Date  . ANTERIOR CERVICAL DECOMP/DISCECTOMY FUSION N/A 05/13/2017   Procedure: ANTERIOR CERVICAL DECOMPRESSION FUSION, CERVICAL 3-4, CERVICAL 4-5 WITH INSTRUMENTATION AND ALLOGRAFT;  HARDWARE REMOVAL AT CERVICAL 5-6, CERVICAL 6-7.;  Surgeon: Phylliss Bob, MD;  Location: [facility];  Service: Orthopedics;  Laterality: N/A;  ANTERIOR CERVICAL DECOMPRESSION FUSION, CERVICAL 3-4, CERVICAL 4-5 WITH INSTRUMENTATION AND ALLOGRAFT; HARDWARE REMOVAL AT CERVICAL 5-6, CERVICAL 6-  . BREAST REDUCTION SURGERY    . CERVICAL LAMINECTOMY    . GASTRIC BYPASS  08/2011  . lumber surgery    . REDUCTION MAMMAPLASTY      There were no vitals filed for this visit.   Subjective Assessment - 12/14/20 1702    Subjective Pt reports continued pain in Rt hip as well as foot.  She is being referred to neurosurgeon for consult next month (for spine).    Pertinent History 2017 low back surgery;  2 neck surgeries, gastric bypass, breast reduction, h/o 2 foot surgeries.    Patient Stated Goals No pain in the left hip, improve the weakness in the left leg.    Currently in Pain? Yes    Pain Score 7     Pain Location Hip    Pain Orientation Right    Pain Descriptors / Indicators Aching    Aggravating Factors  standing, walking `    Pain Relieving Factors ?           Pt seen for aquatic therapy today.  Treatment took place in water 3.5-4.5 ft in depth at the [facility] pool. Temp of  water was 91 deg.  Pt entered/exited the pool via stairs with bilat rail, independently.  Treatment:   Hip circles RLE (knee flexed) - limited tolerance and range Rt ankle ROM (circles) and toe flex/ext x 10 each.  Heel toe raises in deeper water with UE support and floatation belt on.  Side step 10 ft Rt/Lt - limited tolerance  Forward stepping with cues for heel strike and roll through of foot 1 Backward walking -x 30 ft (tolerated well) with cues for toe-heel and hip ext   With floatation belt on waist:  Flutter kick forward/ backward with pool noodle as extra support under arms Forward float and tuck to stand x 5 reps Squats with barbell x 10 (limited wt on RLE) Side step squats with barbell x 10 ft R/L Scissors  in supine  Side stroke (decrease coordination of stroke, will need practice if continuing) Floating on back with noodle under arms/legs for decompression x 4 min   Pt requires buoyancy for support and to offload joints with strengthening exercises. Viscosity of the water is needed for resistance of strengthening; water current perturbations provides challenge to standing balance unsupported, requiring increased core activation.      PT Long Term Goals - 11/15/20 0930      PT LONG TERM GOAL #1   Title The patient will be indep with HEP.    Time 6    Period Weeks    Target Date 12/27/20      PT LONG TERM GOAL #2   Title The patient will improve functional status score from 38% to > or equal to 61%.    Time 6    Period Weeks    Target Date 12/27/20      PT LONG TERM GOAL #3   Title The patient will improve L hip abduction strength to 4+/5    Time 6    Period Weeks    Target Date 12/27/20      PT LONG TERM GOAL #4   Title The patient will report hip pain < or equal to 2/10 with household activities.    Time 6    Period Weeks    Target Date 12/27/20      PT LONG TERM GOAL #5   Title The patient will verbalize understanding of gym routine (member at [employer] -- not active since prior to pandemic).    Time 6    Period Weeks    Target Date 12/27/20                 Plan - 12/14/20 1705    Clinical Impression Statement Pt has visible swelling in the dorsum of her Rt foot; improved after time in the pool (secondary to hydrostatic pressure).  With cues for rolling through feet/toes, pt demonstrated improved ROM in Rt knee/hip/ankle with gait.  Pt had difficulty tolerating WB through RLE even when pt's body was submerged to clavicals.  Pt reported relief of pain down to 3/10 in LE and hip with supine/prone gentle swim strokes (flutter/ scissors). Goals are ongoing.    Rehab Potential Good    PT Frequency 2x / week    PT Duration 6 weeks    PT Treatment/Interventions  ADLs/Self Care Home Management;Taping;Patient/family education;Dry needling;Manual techniques;Therapeutic exercise;Therapeutic activities;Gait training;Stair training;Electrical Stimulation;Cryotherapy;Iontophoresis 99m/ml Dexamethasone;Moist Heat    PT Next Visit Plan assess Rt foot (PT).  aquatic visit:  continue gentle ROM/ strengthening exercises in NWB / partial weight bearing.    PT Home Exercise  Plan [facility]    Consulted and Agree with Plan of Care Patient           Patient will benefit from skilled therapeutic intervention in order to improve the following deficits and impairments:  Pain,Impaired flexibility,Decreased strength,Increased fascial restricitons,Decreased range of motion,Postural dysfunction  Visit Diagnosis: Pain in left hip  Muscle weakness (generalized)  Other symptoms and signs involving the musculoskeletal system     Problem List Patient Active Problem List   Diagnosis Date Noted  . Trochanteric bursitis, right hip 11/30/2020  . Allergic rhinitis 02/03/2020  . Hot flashes due to menopause 09/05/2019  . Postmenopausal hormone therapy 09/05/2019  . Radiculopathy 05/13/2017  . Otitis media 09/01/2016  . Obesity 03/10/2016  . Spinal stenosis of lumbar region 01/18/2016  . Progressive focal motor weakness 01/15/2016  . H/O C3-C7 Fusion 04/30/2015  . Annual physical exam 04/30/2015  . History of Roux-en-Y gastric bypass 11/28/2014  . Essential hypertension, benign 11/28/2014   Kerin Perna, PTA 12/14/20 5:17 PM  [place], [place], [address] Phone: [phone]   Fax:  [phone]  Name: AMORIE RENTZ MRN: [id number] Date of Birth: 03-16-1971

## 2020-12-24 ENCOUNTER — Other Ambulatory Visit: Payer: Self-pay

## 2020-12-24 ENCOUNTER — Ambulatory Visit (INDEPENDENT_AMBULATORY_CARE_PROVIDER_SITE_OTHER): Payer: Commercial Managed Care - PPO | Admitting: Sports Medicine

## 2020-12-24 DIAGNOSIS — M48061 Spinal stenosis, lumbar region without neurogenic claudication: Secondary | ICD-10-CM

## 2020-12-24 MED ORDER — PREDNISONE 10 MG (48) PO TBPK: ORAL_TABLET | Freq: Every day | ORAL | 0 refills | Status: DC

## 2020-12-24 MED ORDER — HYDROCODONE-ACETAMINOPHEN 10-325 MG PO TABS: 1.0000 | ORAL_TABLET | Freq: Three times a day (TID) | ORAL | 0 refills | Status: DC | PRN

## 2020-12-24 NOTE — Progress Notes (Signed)
    Procedures performed today:    None.  Independent interpretation of notes and tests performed by another provider:   None.  Brief History, Exam, Impression, and Recommendations:    Spinal stenosis of lumbar region [place] returns, she is a pleasant 50 year old female, she has known multilevel lumbar spinal stenosis, she had a lumbar decompression on the right at L3-L5 in the distant past with Dr. Christella Noa. She has had lumbar epidurals, steroids, therapy, and unfortunately continues to have worsening back pain with trips. Today she is having foot drop on the right side. We do have an MRI from a few months ago that does show fairly severe spinal stenosis at the L3-L4 level. Due to her foot drop I think she needs to get in with neurosurgery sooner rather than later, we will probably have her see one of the other surgeons at [facility], also do a 12-day prednisone taper and refill her pain medication.    ___________________________________________ Gwen Her. Dianah Field, M.D., ABFM., CAQSM. Primary Care and [place] [employer] of [facility]

## 2020-12-24 NOTE — Assessment & Plan Note (Signed)
Denise Gay returns, she is a pleasant 50 year old female, she has known multilevel lumbar spinal stenosis, she had a lumbar decompression on the right at L3-L5 in the distant past with Dr. Christella Noa. She has had lumbar epidurals, steroids, therapy, and unfortunately continues to have worsening back pain with trips. Today she is having foot drop on the right side. We do have an MRI from a few months ago that does show fairly severe spinal stenosis at the L3-L4 level. Due to her foot drop I think she needs to get in with neurosurgery sooner rather than later, we will probably have her see one of the other surgeons at [facility], also do a 12-day prednisone taper and refill her pain medication.

## 2020-12-25 ENCOUNTER — Telehealth: Payer: Self-pay | Admitting: Physical Therapy

## 2020-12-25 ENCOUNTER — Other Ambulatory Visit: Payer: Self-pay | Admitting: Neurosurgery

## 2020-12-25 DIAGNOSIS — M48062 Spinal stenosis, lumbar region with neurogenic claudication: Secondary | ICD-10-CM

## 2020-12-25 NOTE — Telephone Encounter (Signed)
Called patient to inquire how she was doing after aquatic therapy visit. Pt reported she enjoyed it, but her back/LE pain persists.  She reports she has been referred to a neurosurgeon and is having another MRI.  She reports she would like to hold therapy until she has had follow up appointments with these doctors.   Spoke to supervising PT regarding hold of therapy.   Kerin Perna, PTA 12/25/20 12:41 PM

## 2020-12-28 ENCOUNTER — Ambulatory Visit: Payer: Commercial Managed Care - PPO | Admitting: Physical Therapy

## 2020-12-28 ENCOUNTER — Ambulatory Visit: Payer: Commercial Managed Care - PPO | Admitting: Sports Medicine

## 2020-12-31 ENCOUNTER — Ambulatory Visit (INDEPENDENT_AMBULATORY_CARE_PROVIDER_SITE_OTHER): Payer: Commercial Managed Care - PPO

## 2020-12-31 ENCOUNTER — Other Ambulatory Visit: Payer: Self-pay

## 2020-12-31 DIAGNOSIS — M48062 Spinal stenosis, lumbar region with neurogenic claudication: Secondary | ICD-10-CM

## 2020-12-31 MED ORDER — GADOBUTROL 1 MMOL/ML IV SOLN
8.3000 mL | Freq: Once | INTRAVENOUS | Status: AC | PRN
  Administered 2020-12-31: 8.3 mL via INTRAVENOUS

## 2021-01-04 ENCOUNTER — Other Ambulatory Visit: Payer: Self-pay | Admitting: Orthopedic Surgery

## 2021-01-04 DIAGNOSIS — M5416 Radiculopathy, lumbar region: Secondary | ICD-10-CM

## 2021-01-09 ENCOUNTER — Ambulatory Visit
Admission: RE | Admit: 2021-01-09 | Discharge: 2021-01-09 | Disposition: A | Discharge status: Home / Self Care | Payer: Commercial Managed Care - PPO | Source: Ambulatory Visit | Attending: Orthopedic Surgery | Admitting: Orthopedic Surgery

## 2021-01-09 ENCOUNTER — Other Ambulatory Visit: Payer: Self-pay | Admitting: Orthopedic Surgery

## 2021-01-09 ENCOUNTER — Other Ambulatory Visit: Payer: Self-pay

## 2021-01-09 DIAGNOSIS — M5416 Radiculopathy, lumbar region: Secondary | ICD-10-CM

## 2021-01-09 MED ORDER — METHYLPREDNISOLONE ACETATE 40 MG/ML INJ SUSP (RADIOLOG
120.0000 mg | Freq: Once | INTRAMUSCULAR | Status: AC
  Administered 2021-01-09: 120 mg via EPIDURAL

## 2021-01-09 MED ORDER — IOPAMIDOL (ISOVUE-M 200) INJECTION 41%
1.0000 mL | Freq: Once | INTRAMUSCULAR | Status: AC
  Administered 2021-01-09: 1 mL

## 2021-01-09 NOTE — Discharge Instructions (Signed)

## 2021-01-19 ENCOUNTER — Other Ambulatory Visit: Payer: Self-pay | Admitting: Sports Medicine

## 2021-01-19 DIAGNOSIS — I10 Essential (primary) hypertension: Secondary | ICD-10-CM

## 2021-01-21 ENCOUNTER — Ambulatory Visit: Payer: Commercial Managed Care - PPO | Admitting: Sports Medicine

## 2021-02-05 ENCOUNTER — Other Ambulatory Visit: Payer: Self-pay | Admitting: Orthopedic Surgery

## 2021-03-01 NOTE — Pre-Procedure Instructions (Signed)
Surgical Instructions    Your procedure is scheduled on Thursday, May 19th from 12:00-4:26 p.m.  Report to [facility] Main Entrance "A" at 9:00 A.M., then check in with the Admitting office.  Call this number if you have problems the morning of surgery:  [phone]   If you have any questions prior to your surgery date call [phone]: Open Monday-Friday 8am-4pm    Remember:  Do not eat after midnight the night before your surgery  You may drink clear liquids until 9:00 a.m. the morning of your surgery.   Clear liquids allowed are: Water, Non-Citrus Juices (without pulp), Carbonated Beverages, Clear Tea, Black Coffee Only, and Gatorade. (Please choose diet or sugar-free options)  Please complete your 10 oz bottle of water that was provided to you by 9:00 a.m. the morning of surgery.    Take these medicines the morning of surgery with A SIP OF WATER cetirizine (ZYRTEC)  cyclobenzaprine (FLEXERIL) methocarbamol (ROBAXIN)  omeprazole (PRILOSEC) sertraline (ZOLOFT) topiramate (TOPAMAX)  ziprasidone (GEODON)  HYDROcodone-acetaminophen (NORCO)-as needed for pain  As of today, STOP taking any Aspirin (unless otherwise instructed by your surgeon), celecoxib (CELEBREX), Aleve, Naproxen, Ibuprofen, Motrin, Advil, Goody's, BC's, all herbal medications, fish oil, and all vitamins.   HOW TO MANAGE YOUR DIABETES BEFORE AND AFTER SURGERY  Why is it important to control my blood sugar before and after surgery? . Improving blood sugar levels before and after surgery helps healing and can limit problems. . A way of improving blood sugar control is eating a healthy diet by: o  Eating less sugar and carbohydrates o  Increasing activity/exercise o  Talking with your doctor about reaching your blood sugar goals . High blood sugars (greater than 180 mg/dL) can raise your risk of infections and slow your recovery, so you will need to focus on controlling your diabetes during the weeks before  surgery. . Make sure that the doctor who takes care of your diabetes knows about your planned surgery including the date and location.  How do I manage my blood sugar before surgery? . Check your blood sugar at least 4 times a day, starting 2 days before surgery, to make sure that the level is not too high or low. . Check your blood sugar the morning of your surgery when you wake up and every 2 hours until you get to the Short Stay unit. o If your blood sugar is less than 70 mg/dL, you will need to treat for low blood sugar: - Do not take insulin. - Treat a low blood sugar (less than 70 mg/dL) with  cup of clear juice (cranberry or apple), 4 glucose tablets, OR glucose gel. - Recheck blood sugar in 15 minutes after treatment (to make sure it is greater than 70 mg/dL). If your blood sugar is not greater than 70 mg/dL on recheck, call [phone] for further instructions. . Report your blood sugar to the short stay nurse when you get to Short Stay.  . If you are admitted to the hospital after surgery: o Your blood sugar will be checked by the staff and you will probably be given insulin after surgery (instead of oral diabetes medicines) to make sure you have good blood sugar levels. o The goal for blood sugar control after surgery is 80-180 mg/dL.                      Do NOT Smoke (Tobacco/Vaping) or drink Alcohol 24 hours prior to your procedure.  If you  use a CPAP at night, you may bring all equipment for your overnight stay.   Contacts, glasses, piercing's, hearing aid's, dentures or partials may not be worn into surgery, please bring cases for these belongings.    For patients admitted to the hospital, discharge time will be determined by your treatment team.   Patients discharged the day of surgery will not be allowed to drive home, and someone needs to stay with them for 24 hours.    Special instructions:   [facility]- Preparing For Surgery  Before surgery, you can play an  important role. Because skin is not sterile, your skin needs to be as free of germs as possible. You can reduce the number of germs on your skin by washing with CHG (chlorahexidine gluconate) Soap before surgery.  CHG is an antiseptic cleaner which kills germs and bonds with the skin to continue killing germs even after washing.    Oral Hygiene is also important to reduce your risk of infection.  Remember - BRUSH YOUR TEETH THE MORNING OF SURGERY WITH YOUR REGULAR TOOTHPASTE  Please do not use if you have an allergy to CHG or antibacterial soaps. If your skin becomes reddened/irritated stop using the CHG.  Do not shave (including legs and underarms) for at least 48 hours prior to first CHG shower. It is OK to shave your face.  Please follow these instructions carefully.   1. Shower the NIGHT BEFORE SURGERY and the MORNING OF SURGERY  2. If you chose to wash your hair, wash your hair first as usual with your normal shampoo.  3. After you shampoo, rinse your hair and body thoroughly to remove the shampoo.  4. Use CHG Soap as you would any other liquid soap. You can apply CHG directly to the skin and wash gently with a scrungie or a clean washcloth.   5. Apply the CHG Soap to your body ONLY FROM THE NECK DOWN.  Do not use on open wounds or open sores. Avoid contact with your eyes, ears, mouth and genitals (private parts). Wash Face and genitals (private parts)  with your normal soap.   6. Wash thoroughly, paying special attention to the area where your surgery will be performed.  7. Thoroughly rinse your body with warm water from the neck down.  8. DO NOT shower/wash with your normal soap after using and rinsing off the CHG Soap.  9. Pat yourself dry with a CLEAN TOWEL.  10. Wear CLEAN PAJAMAS to bed the night before surgery  11. Place CLEAN SHEETS on your bed the night before your surgery  12. DO NOT SLEEP WITH PETS.   Day of Surgery: Shower with CHG soap. Do not wear jewelry, make  up, or nail polish Do not wear lotions, powders, perfumes, or deodorant. Do not shave 48 hours prior to surgery.   Do not bring valuables to the hospital. [facility] is not responsible for any belongings or valuables. Wear Clean/Comfortable clothing the morning of surgery Remember to brush your teeth WITH YOUR REGULAR TOOTHPASTE.   Please read over the following fact sheets that you were given.

## 2021-03-04 ENCOUNTER — Other Ambulatory Visit: Payer: Self-pay

## 2021-03-04 ENCOUNTER — Encounter (HOSPITAL_COMMUNITY)
Admission: RE | Admit: 2021-03-04 | Discharge: 2021-03-04 | Disposition: A | Discharge status: Home / Self Care | Payer: Commercial Managed Care - PPO | Source: Ambulatory Visit | Attending: Orthopedic Surgery | Admitting: Orthopedic Surgery

## 2021-03-04 ENCOUNTER — Encounter (HOSPITAL_COMMUNITY): Payer: Self-pay

## 2021-03-04 DIAGNOSIS — Z20822 Contact with and (suspected) exposure to covid-19: Secondary | ICD-10-CM | POA: Insufficient documentation

## 2021-03-04 DIAGNOSIS — Z01818 Encounter for other preprocedural examination: Secondary | ICD-10-CM | POA: Insufficient documentation

## 2021-03-04 LAB — CBC WITH DIFFERENTIAL/PLATELET
Abs Immature Granulocytes: 0.08 10*3/uL — ABNORMAL HIGH (ref 0.00–0.07)
Basophils Absolute: 0.1 10*3/uL (ref 0.0–0.1)
Basophils Relative: 2 %
Eosinophils Absolute: 0.2 10*3/uL (ref 0.0–0.5)
Eosinophils Relative: 2 %
HCT: 38.5 % (ref 36.0–46.0)
Hemoglobin: 12.3 g/dL (ref 12.0–15.0)
Immature Granulocytes: 1 %
Lymphocytes Relative: 23 %
Lymphs Abs: 2 10*3/uL (ref 0.7–4.0)
MCH: 30.2 pg (ref 26.0–34.0)
MCHC: 31.9 g/dL (ref 30.0–36.0)
MCV: 94.6 fL (ref 80.0–100.0)
Monocytes Absolute: 0.8 10*3/uL (ref 0.1–1.0)
Monocytes Relative: 9 %
Neutro Abs: 5.5 10*3/uL (ref 1.7–7.7)
Neutrophils Relative %: 63 %
Platelets: 189 10*3/uL (ref 150–400)
RBC: 4.07 MIL/uL (ref 3.87–5.11)
RDW: 12.6 % (ref 11.5–15.5)
WBC: 8.7 10*3/uL (ref 4.0–10.5)
nRBC: 0 % (ref 0.0–0.2)

## 2021-03-04 LAB — COMPREHENSIVE METABOLIC PANEL
ALT: 40 U/L (ref 0–44)
AST: 30 U/L (ref 15–41)
Albumin: 4 g/dL (ref 3.5–5.0)
Alkaline Phosphatase: 52 U/L (ref 38–126)
Anion gap: 7 (ref 5–15)
BUN: 13 mg/dL (ref 6–20)
CO2: 26 mmol/L (ref 22–32)
Calcium: 9.4 mg/dL (ref 8.9–10.3)
Chloride: 105 mmol/L (ref 98–111)
Creatinine, Ser: 0.59 mg/dL (ref 0.44–1.00)
GFR, Estimated: >60 mL/min (ref >60)
Glucose, Bld: 100 mg/dL — ABNORMAL HIGH (ref 70–99)
Potassium: 4 mmol/L (ref 3.5–5.1)
Sodium: 138 mmol/L (ref 135–145)
Total Bilirubin: 0.7 mg/dL (ref 0.3–1.2)
Total Protein: 7 g/dL (ref 6.5–8.1)

## 2021-03-04 LAB — TYPE AND SCREEN
ABO/RH(D): B NEG
Antibody Screen: NEGATIVE

## 2021-03-04 LAB — SURGICAL PCR SCREEN
MRSA, PCR: NEGATIVE
Staphylococcus aureus: NEGATIVE

## 2021-03-04 LAB — PROTIME-INR
INR: 1 (ref 0.8–1.2)
Prothrombin Time: 13.6 seconds (ref 11.4–15.2)

## 2021-03-04 LAB — HEMOGLOBIN A1C
Hgb A1c MFr Bld: 6.2 % — ABNORMAL HIGH (ref 4.8–5.6)
Mean Plasma Glucose: 131.24 mg/dL

## 2021-03-04 LAB — APTT: aPTT: 28 seconds (ref 24–36)

## 2021-03-04 NOTE — Progress Notes (Signed)
Pt unable to void to provide sample for U/A. Pt aware to obtain DOS.

## 2021-03-04 NOTE — Progress Notes (Signed)
PCP - Gwen Her. Dianah Field, MD Cardiologist - Denies  PPM/ICD - Denies  Chest x-ray - N/A EKG - 03/04/21 Stress Test - Per patient, ~ 10 years ago, done as part of a work-up to get gastric bypass sx; per pt, results were normal. ECHO - Denies Cardiac Cath - Denies  Sleep Study - Yes, positive for OSA CPAP - Per pt, No longer wears since weight loss has helped  Pt is diabetic but does not check CBGs. A1C obtained  Blood Thinner Instructions: N/A Aspirin Instructions: N/A  ERAS Protcol - Yes PRE-SURGERY Ensure or G2- 10 oz Water given  COVID TEST- 03/04/21   Anesthesia review: No  Patient denies shortness of breath, fever, cough and chest pain at PAT appointment   All instructions explained to the patient, with a verbal understanding of the material. Patient agrees to go over the instructions while at home for a better understanding. Patient also instructed to self quarantine after being tested for COVID-19. The opportunity to ask questions was provided.

## 2021-03-05 LAB — SARS CORONAVIRUS 2 (TAT 6-24 HRS): SARS Coronavirus 2: NEGATIVE

## 2021-03-07 ENCOUNTER — Inpatient Hospital Stay (HOSPITAL_COMMUNITY): Payer: Commercial Managed Care - PPO

## 2021-03-07 ENCOUNTER — Inpatient Hospital Stay (HOSPITAL_COMMUNITY)
Admission: RE | Disposition: A | Discharge status: Home / Self Care | Payer: Self-pay | Source: Home / Self Care | Attending: Orthopedic Surgery

## 2021-03-07 ENCOUNTER — Other Ambulatory Visit: Payer: Self-pay

## 2021-03-07 ENCOUNTER — Inpatient Hospital Stay (HOSPITAL_COMMUNITY): Payer: Commercial Managed Care - PPO | Admitting: Certified Registered"

## 2021-03-07 ENCOUNTER — Encounter (HOSPITAL_COMMUNITY): Payer: Self-pay | Admitting: Orthopedic Surgery

## 2021-03-07 ENCOUNTER — Inpatient Hospital Stay (HOSPITAL_COMMUNITY)
Admission: RE | Admit: 2021-03-07 | Discharge: 2021-03-08 | DRG: 455 | Disposition: A | Discharge status: Home / Self Care | Payer: Commercial Managed Care - PPO | Attending: Orthopedic Surgery | Admitting: Orthopedic Surgery

## 2021-03-07 DIAGNOSIS — K219 Gastro-esophageal reflux disease without esophagitis: Secondary | ICD-10-CM | POA: Diagnosis present

## 2021-03-07 DIAGNOSIS — M5416 Radiculopathy, lumbar region: Secondary | ICD-10-CM | POA: Diagnosis present

## 2021-03-07 DIAGNOSIS — M48061 Spinal stenosis, lumbar region without neurogenic claudication: Secondary | ICD-10-CM | POA: Diagnosis present

## 2021-03-07 DIAGNOSIS — Z9884 Bariatric surgery status: Secondary | ICD-10-CM | POA: Diagnosis not present

## 2021-03-07 DIAGNOSIS — Z79899 Other long term (current) drug therapy: Secondary | ICD-10-CM

## 2021-03-07 DIAGNOSIS — Z20822 Contact with and (suspected) exposure to covid-19: Secondary | ICD-10-CM | POA: Diagnosis present

## 2021-03-07 DIAGNOSIS — E119 Type 2 diabetes mellitus without complications: Secondary | ICD-10-CM | POA: Diagnosis present

## 2021-03-07 DIAGNOSIS — I10 Essential (primary) hypertension: Secondary | ICD-10-CM | POA: Diagnosis present

## 2021-03-07 DIAGNOSIS — M5410 Radiculopathy, site unspecified: Secondary | ICD-10-CM | POA: Diagnosis present

## 2021-03-07 DIAGNOSIS — Z87891 Personal history of nicotine dependence: Secondary | ICD-10-CM

## 2021-03-07 DIAGNOSIS — Z419 Encounter for procedure for purposes other than remedying health state, unspecified: Secondary | ICD-10-CM

## 2021-03-07 HISTORY — PX: TRANSFORAMINAL LUMBAR INTERBODY FUSION (TLIF) WITH PEDICLE SCREW FIXATION 1 LEVEL: SHX6141

## 2021-03-07 LAB — ABO/RH: ABO/RH(D): B NEG

## 2021-03-07 LAB — GLUCOSE, CAPILLARY: Glucose-Capillary: 91 mg/dL (ref 70–99)

## 2021-03-07 SURGERY — TRANSFORAMINAL LUMBAR INTERBODY FUSION (TLIF) WITH PEDICLE SCREW FIXATION 1 LEVEL
Anesthesia: General | Site: Spine Lumbar | Laterality: Right

## 2021-03-07 MED ORDER — CEFAZOLIN SODIUM-DEXTROSE 2-4 GM/100ML-% IV SOLN
2.0000 g | Freq: Three times a day (TID) | INTRAVENOUS | Status: AC
  Administered 2021-03-07 – 2021-03-08 (×2): 2 g via INTRAVENOUS
  Filled 2021-03-07 (×2): qty 100

## 2021-03-07 MED ORDER — LORATADINE 10 MG PO TABS
10.0000 mg | ORAL_TABLET | Freq: Every day | ORAL | Status: DC
  Administered 2021-03-08: 10 mg via ORAL
  Filled 2021-03-07: qty 1

## 2021-03-07 MED ORDER — SUFENTANIL CITRATE 50 MCG/ML IV SOLN
INTRAVENOUS | Status: DC | PRN
  Administered 2021-03-07 (×4): 10 ug via INTRAVENOUS

## 2021-03-07 MED ORDER — FLEET ENEMA 7-19 GM/118ML RE ENEM: 1.0000 | ENEMA | Freq: Once | RECTAL | Status: DC | PRN

## 2021-03-07 MED ORDER — SERTRALINE HCL 100 MG PO TABS
100.0000 mg | ORAL_TABLET | Freq: Two times a day (BID) | ORAL | Status: DC
  Administered 2021-03-07 – 2021-03-08 (×2): 100 mg via ORAL
  Filled 2021-03-07 (×2): qty 2
  Filled 2021-03-07 (×2): qty 1

## 2021-03-07 MED ORDER — PHENYLEPHRINE 40 MCG/ML (10ML) SYRINGE FOR IV PUSH (FOR BLOOD PRESSURE SUPPORT)
PREFILLED_SYRINGE | INTRAVENOUS | Status: AC
  Filled 2021-03-07: qty 10

## 2021-03-07 MED ORDER — DOCUSATE SODIUM 100 MG PO CAPS
100.0000 mg | ORAL_CAPSULE | Freq: Two times a day (BID) | ORAL | Status: DC
  Administered 2021-03-07 – 2021-03-08 (×2): 100 mg via ORAL
  Filled 2021-03-07 (×2): qty 1

## 2021-03-07 MED ORDER — SODIUM CHLORIDE 0.9% FLUSH: 3.0000 mL | INTRAVENOUS | Status: DC | PRN

## 2021-03-07 MED ORDER — LIDOCAINE 2% (20 MG/ML) 5 ML SYRINGE
INTRAMUSCULAR | Status: AC
  Filled 2021-03-07: qty 5

## 2021-03-07 MED ORDER — ROCURONIUM BROMIDE 10 MG/ML (PF) SYRINGE
PREFILLED_SYRINGE | INTRAVENOUS | Status: AC
  Filled 2021-03-07: qty 10

## 2021-03-07 MED ORDER — PROMETHAZINE HCL 25 MG/ML IJ SOLN: 6.2500 mg | INTRAMUSCULAR | Status: DC | PRN

## 2021-03-07 MED ORDER — HEMOSTATIC AGENTS (NO CHARGE) OPTIME
TOPICAL | Status: DC | PRN
  Administered 2021-03-07: 1 via TOPICAL

## 2021-03-07 MED ORDER — SODIUM CHLORIDE 0.9% FLUSH: 3.0000 mL | Freq: Two times a day (BID) | INTRAVENOUS | Status: DC

## 2021-03-07 MED ORDER — ZIPRASIDONE HCL 40 MG PO CAPS
40.0000 mg | ORAL_CAPSULE | Freq: Two times a day (BID) | ORAL | Status: DC
  Administered 2021-03-08 (×2): 40 mg via ORAL
  Filled 2021-03-07 (×2): qty 1

## 2021-03-07 MED ORDER — OXYCODONE-ACETAMINOPHEN 5-325 MG PO TABS
ORAL_TABLET | ORAL | Status: AC
  Administered 2021-03-07: 2 via ORAL
  Filled 2021-03-07: qty 2

## 2021-03-07 MED ORDER — METHYLENE BLUE 0.5 % INJ SOLN
INTRAVENOUS | Status: AC
  Filled 2021-03-07: qty 10

## 2021-03-07 MED ORDER — DEXAMETHASONE SODIUM PHOSPHATE 10 MG/ML IJ SOLN
INTRAMUSCULAR | Status: DC | PRN
  Administered 2021-03-07: 10 mg via INTRAVENOUS

## 2021-03-07 MED ORDER — MIDAZOLAM HCL 2 MG/2ML IJ SOLN
INTRAMUSCULAR | Status: DC | PRN
  Administered 2021-03-07: 2 mg via INTRAVENOUS

## 2021-03-07 MED ORDER — PROPOFOL 10 MG/ML IV BOLUS
INTRAVENOUS | Status: AC
  Filled 2021-03-07: qty 20

## 2021-03-07 MED ORDER — BUPIVACAINE LIPOSOME 1.3 % IJ SUSP
INTRAMUSCULAR | Status: DC | PRN
  Administered 2021-03-07: 20 mL

## 2021-03-07 MED ORDER — ZOLPIDEM TARTRATE 5 MG PO TABS: 5.0000 mg | ORAL_TABLET | Freq: Every evening | ORAL | Status: DC | PRN

## 2021-03-07 MED ORDER — SENNOSIDES-DOCUSATE SODIUM 8.6-50 MG PO TABS: 1.0000 | ORAL_TABLET | Freq: Every evening | ORAL | Status: DC | PRN

## 2021-03-07 MED ORDER — ONDANSETRON HCL 4 MG PO TABS: 4.0000 mg | ORAL_TABLET | Freq: Four times a day (QID) | ORAL | Status: DC | PRN

## 2021-03-07 MED ORDER — LOSARTAN POTASSIUM 50 MG PO TABS
100.0000 mg | ORAL_TABLET | Freq: Every day | ORAL | Status: DC
  Administered 2021-03-08: 100 mg via ORAL
  Filled 2021-03-07: qty 2

## 2021-03-07 MED ORDER — 0.9 % SODIUM CHLORIDE (POUR BTL) OPTIME
TOPICAL | Status: DC | PRN
  Administered 2021-03-07 (×2): 1000 mL

## 2021-03-07 MED ORDER — PROPOFOL 10 MG/ML IV BOLUS
INTRAVENOUS | Status: DC | PRN
  Administered 2021-03-07: 200 mg via INTRAVENOUS
  Administered 2021-03-07: 30 mg via INTRAVENOUS

## 2021-03-07 MED ORDER — POTASSIUM CHLORIDE IN NACL 20-0.9 MEQ/L-% IV SOLN: INTRAVENOUS | Status: DC

## 2021-03-07 MED ORDER — HYDROCODONE-ACETAMINOPHEN 10-325 MG PO TABS: 1.0000 | ORAL_TABLET | Freq: Three times a day (TID) | ORAL | Status: DC | PRN

## 2021-03-07 MED ORDER — BISACODYL 5 MG PO TBEC: 5.0000 mg | DELAYED_RELEASE_TABLET | Freq: Every day | ORAL | Status: DC | PRN

## 2021-03-07 MED ORDER — DEXAMETHASONE SODIUM PHOSPHATE 10 MG/ML IJ SOLN
INTRAMUSCULAR | Status: AC
  Filled 2021-03-07: qty 1

## 2021-03-07 MED ORDER — LACTATED RINGERS IV SOLN: INTRAVENOUS | Status: DC | PRN

## 2021-03-07 MED ORDER — LIDOCAINE HCL (CARDIAC) PF 100 MG/5ML IV SOSY
PREFILLED_SYRINGE | INTRAVENOUS | Status: DC | PRN
  Administered 2021-03-07: 100 mg via INTRAVENOUS

## 2021-03-07 MED ORDER — TOPIRAMATE 25 MG PO TABS
50.0000 mg | ORAL_TABLET | Freq: Two times a day (BID) | ORAL | Status: DC
  Administered 2021-03-07 – 2021-03-08 (×2): 50 mg via ORAL
  Filled 2021-03-07 (×3): qty 2

## 2021-03-07 MED ORDER — DIPHENHYDRAMINE HCL 25 MG PO CAPS
25.0000 mg | ORAL_CAPSULE | Freq: Four times a day (QID) | ORAL | Status: DC | PRN
  Administered 2021-03-08: 25 mg via ORAL
  Filled 2021-03-07: qty 1

## 2021-03-07 MED ORDER — SODIUM CHLORIDE 0.9 % IV SOLN: 250.0000 mL | INTRAVENOUS | Status: DC

## 2021-03-07 MED ORDER — HYDROMORPHONE HCL 1 MG/ML IJ SOLN
0.2500 mg | INTRAMUSCULAR | Status: DC | PRN
  Administered 2021-03-07 (×2): 0.25 mg via INTRAVENOUS

## 2021-03-07 MED ORDER — OXYCODONE-ACETAMINOPHEN 5-325 MG PO TABS
1.0000 | ORAL_TABLET | ORAL | Status: DC | PRN
  Administered 2021-03-07 – 2021-03-08 (×4): 2 via ORAL
  Filled 2021-03-07 (×4): qty 2

## 2021-03-07 MED ORDER — THROMBIN (RECOMBINANT) 20000 UNITS EX SOLR
CUTANEOUS | Status: AC
  Filled 2021-03-07: qty 20000

## 2021-03-07 MED ORDER — ACETAMINOPHEN 500 MG PO TABS
1000.0000 mg | ORAL_TABLET | Freq: Once | ORAL | Status: AC
  Administered 2021-03-07: 1000 mg via ORAL
  Filled 2021-03-07: qty 2

## 2021-03-07 MED ORDER — THROMBIN 20000 UNITS EX SOLR
CUTANEOUS | Status: DC | PRN
  Administered 2021-03-07: 20000 [IU] via TOPICAL

## 2021-03-07 MED ORDER — ONDANSETRON HCL 4 MG/2ML IJ SOLN
INTRAMUSCULAR | Status: AC
  Filled 2021-03-07: qty 2

## 2021-03-07 MED ORDER — PHENOL 1.4 % MT LIQD: 1.0000 | OROMUCOSAL | Status: DC | PRN

## 2021-03-07 MED ORDER — PHENYLEPHRINE HCL-NACL 10-0.9 MG/250ML-% IV SOLN
INTRAVENOUS | Status: DC | PRN
  Administered 2021-03-07: 50 ug/min via INTRAVENOUS

## 2021-03-07 MED ORDER — LACTATED RINGERS IV SOLN: INTRAVENOUS | Status: DC

## 2021-03-07 MED ORDER — CEFAZOLIN SODIUM-DEXTROSE 2-4 GM/100ML-% IV SOLN
INTRAVENOUS | Status: AC
  Filled 2021-03-07: qty 100

## 2021-03-07 MED ORDER — PANTOPRAZOLE SODIUM 40 MG PO TBEC
80.0000 mg | DELAYED_RELEASE_TABLET | Freq: Every day | ORAL | Status: DC
  Administered 2021-03-08: 80 mg via ORAL
  Filled 2021-03-07: qty 2

## 2021-03-07 MED ORDER — MORPHINE SULFATE (PF) 2 MG/ML IV SOLN
1.0000 mg | INTRAVENOUS | Status: DC | PRN
Start: 2021-03-07 — End: 2021-03-08

## 2021-03-07 MED ORDER — SODIUM CHLORIDE (PF) 0.9 % IJ SOLN
INTRAMUSCULAR | Status: AC
  Filled 2021-03-07: qty 10

## 2021-03-07 MED ORDER — ROCURONIUM BROMIDE 10 MG/ML (PF) SYRINGE
PREFILLED_SYRINGE | INTRAVENOUS | Status: DC | PRN
  Administered 2021-03-07: 30 mg via INTRAVENOUS
  Administered 2021-03-07: 100 mg via INTRAVENOUS

## 2021-03-07 MED ORDER — MIDAZOLAM HCL 2 MG/2ML IJ SOLN
INTRAMUSCULAR | Status: AC
  Filled 2021-03-07: qty 2

## 2021-03-07 MED ORDER — CHLORHEXIDINE GLUCONATE 0.12 % MT SOLN
OROMUCOSAL | Status: AC
  Administered 2021-03-07: 15 mL via OROMUCOSAL
  Filled 2021-03-07: qty 15

## 2021-03-07 MED ORDER — METHOCARBAMOL 500 MG PO TABS
500.0000 mg | ORAL_TABLET | Freq: Three times a day (TID) | ORAL | Status: DC | PRN
  Administered 2021-03-08: 500 mg via ORAL

## 2021-03-07 MED ORDER — METHOCARBAMOL 1000 MG/10ML IJ SOLN: 500.0000 mg | Freq: Four times a day (QID) | INTRAVENOUS | Status: DC | PRN

## 2021-03-07 MED ORDER — METHOCARBAMOL 750 MG PO TABS
750.0000 mg | ORAL_TABLET | Freq: Three times a day (TID) | ORAL | Status: DC
  Administered 2021-03-07 – 2021-03-08 (×2): 750 mg via ORAL
  Filled 2021-03-07 (×3): qty 1

## 2021-03-07 MED ORDER — ONDANSETRON HCL 4 MG/2ML IJ SOLN
INTRAMUSCULAR | Status: DC | PRN
  Administered 2021-03-07: 4 mg via INTRAVENOUS

## 2021-03-07 MED ORDER — BUPIVACAINE-EPINEPHRINE 0.25% -1:200000 IJ SOLN
INTRAMUSCULAR | Status: DC | PRN
  Administered 2021-03-07: 20 mL
  Administered 2021-03-07: 8 mL

## 2021-03-07 MED ORDER — MENTHOL 3 MG MT LOZG: 1.0000 | LOZENGE | OROMUCOSAL | Status: DC | PRN

## 2021-03-07 MED ORDER — PRAZOSIN HCL 2 MG PO CAPS
2.0000 mg | ORAL_CAPSULE | Freq: Every day | ORAL | Status: DC
  Administered 2021-03-08: 2 mg via ORAL
  Filled 2021-03-07: qty 1

## 2021-03-07 MED ORDER — METHOCARBAMOL 500 MG PO TABS: 500.0000 mg | ORAL_TABLET | Freq: Four times a day (QID) | ORAL | Status: DC | PRN

## 2021-03-07 MED ORDER — ACETAMINOPHEN 325 MG PO TABS: 650.0000 mg | ORAL_TABLET | ORAL | Status: DC | PRN

## 2021-03-07 MED ORDER — HYDROMORPHONE HCL 1 MG/ML IJ SOLN
INTRAMUSCULAR | Status: AC
  Administered 2021-03-07: 0.5 mg via INTRAVENOUS
  Filled 2021-03-07: qty 1

## 2021-03-07 MED ORDER — ACETAMINOPHEN 650 MG RE SUPP: 650.0000 mg | RECTAL | Status: DC | PRN

## 2021-03-07 MED ORDER — ORAL CARE MOUTH RINSE: 15.0000 mL | Freq: Once | OROMUCOSAL | Status: AC

## 2021-03-07 MED ORDER — ALUM & MAG HYDROXIDE-SIMETH 200-200-20 MG/5ML PO SUSP: 30.0000 mL | Freq: Four times a day (QID) | ORAL | Status: DC | PRN

## 2021-03-07 MED ORDER — METHYLENE BLUE 0.5 % INJ SOLN
INTRAVENOUS | Status: DC | PRN
  Administered 2021-03-07: .3 mL via INTRADERMAL

## 2021-03-07 MED ORDER — POVIDONE-IODINE 7.5 % EX SOLN
Freq: Once | CUTANEOUS | Status: DC
  Filled 2021-03-07: qty 118

## 2021-03-07 MED ORDER — BUPIVACAINE-EPINEPHRINE (PF) 0.25% -1:200000 IJ SOLN
INTRAMUSCULAR | Status: AC
  Filled 2021-03-07: qty 30

## 2021-03-07 MED ORDER — BUPIVACAINE LIPOSOME 1.3 % IJ SUSP
INTRAMUSCULAR | Status: AC
  Filled 2021-03-07: qty 20

## 2021-03-07 MED ORDER — METHOCARBAMOL 1000 MG/10ML IJ SOLN
500.0000 mg | Freq: Three times a day (TID) | INTRAVENOUS | Status: DC | PRN
  Filled 2021-03-07: qty 5

## 2021-03-07 MED ORDER — SUGAMMADEX SODIUM 200 MG/2ML IV SOLN
INTRAVENOUS | Status: DC | PRN
  Administered 2021-03-07: 200 mg via INTRAVENOUS

## 2021-03-07 MED ORDER — CEFAZOLIN SODIUM-DEXTROSE 2-4 GM/100ML-% IV SOLN
2.0000 g | INTRAVENOUS | Status: AC
  Administered 2021-03-07: 2 g via INTRAVENOUS

## 2021-03-07 MED ORDER — SUFENTANIL CITRATE 50 MCG/ML IV SOLN
INTRAVENOUS | Status: AC
  Filled 2021-03-07: qty 1

## 2021-03-07 MED ORDER — ONDANSETRON HCL 4 MG/2ML IJ SOLN: 4.0000 mg | Freq: Four times a day (QID) | INTRAMUSCULAR | Status: DC | PRN

## 2021-03-07 MED ORDER — CHLORHEXIDINE GLUCONATE 0.12 % MT SOLN: 15.0000 mL | Freq: Once | OROMUCOSAL | Status: AC

## 2021-03-07 SURGICAL SUPPLY — 81 items
BENZOIN TINCTURE PRP APPL 2/3 (GAUZE/BANDAGES/DRESSINGS) ×2 IMPLANT
BLADE CLIPPER SURG (BLADE) ×2 IMPLANT
BUR PRESCISION 1.7 ELITE (BURR) ×2 IMPLANT
BUR ROUND FLUTED 5 RND (BURR) ×2 IMPLANT
BUR ROUND PRECISION 4.0 (BURR) ×2 IMPLANT
BUR SABER RD CUTTING 3.0 (BURR) IMPLANT
CAGE CONCORDE BULLET 9X9X27 (Cage) ×2 IMPLANT
CARTRIDGE OIL MAESTRO DRILL (MISCELLANEOUS) ×1 IMPLANT
CNTNR URN SCR LID CUP LEK RST (MISCELLANEOUS) ×1 IMPLANT
CONT SPEC 4OZ STRL OR WHT (MISCELLANEOUS) ×1
COVER BACK TABLE 60X90IN (DRAPES) IMPLANT
COVER MAYO STAND STRL (DRAPES) ×4 IMPLANT
COVER SURGICAL LIGHT HANDLE (MISCELLANEOUS) ×2 IMPLANT
COVER WAND RF STERILE (DRAPES) IMPLANT
DIFFUSER DRILL AIR PNEUMATIC (MISCELLANEOUS) ×2 IMPLANT
DRAIN CHANNEL 15F RND FF W/TCR (WOUND CARE) IMPLANT
DRAPE C-ARM 42X72 X-RAY (DRAPES) ×2 IMPLANT
DRAPE C-ARMOR (DRAPES) ×2 IMPLANT
DRAPE POUCH INSTRU U-SHP 10X18 (DRAPES) ×2 IMPLANT
DRAPE SURG 17X23 STRL (DRAPES) ×8 IMPLANT
DURAPREP 26ML APPLICATOR (WOUND CARE) ×2 IMPLANT
ELECT BLADE 4.0 EZ CLEAN MEGAD (MISCELLANEOUS) ×2
ELECT CAUTERY BLADE 6.4 (BLADE) ×2 IMPLANT
ELECT REM PT RETURN 9FT ADLT (ELECTROSURGICAL) ×2
ELECTRODE BLDE 4.0 EZ CLN MEGD (MISCELLANEOUS) ×1 IMPLANT
ELECTRODE REM PT RTRN 9FT ADLT (ELECTROSURGICAL) ×1 IMPLANT
EVACUATOR SILICONE 100CC (DRAIN) IMPLANT
FILTER STRAW FLUID ASPIR (MISCELLANEOUS) ×2 IMPLANT
GAUZE 4X4 16PLY RFD (DISPOSABLE) ×2 IMPLANT
GAUZE SPONGE 4X4 12PLY STRL (GAUZE/BANDAGES/DRESSINGS) ×2 IMPLANT
GAUZE SPONGE 4X4 12PLY STRL LF (GAUZE/BANDAGES/DRESSINGS) ×2 IMPLANT
GLOVE BIO SURGEON STRL SZ7 (GLOVE) ×2 IMPLANT
GLOVE BIO SURGEON STRL SZ8 (GLOVE) ×2 IMPLANT
GLOVE SRG 8 PF TXTR STRL LF DI (GLOVE) ×1 IMPLANT
GLOVE SURG UNDER POLY LF SZ7 (GLOVE) ×10 IMPLANT
GLOVE SURG UNDER POLY LF SZ8 (GLOVE) ×1
GOWN STRL REUS W/ TWL LRG LVL3 (GOWN DISPOSABLE) ×5 IMPLANT
GOWN STRL REUS W/ TWL XL LVL3 (GOWN DISPOSABLE) ×2 IMPLANT
GOWN STRL REUS W/TWL LRG LVL3 (GOWN DISPOSABLE) ×5
GOWN STRL REUS W/TWL XL LVL3 (GOWN DISPOSABLE) ×2
IV CATH 14GX2 1/4 (CATHETERS) ×2 IMPLANT
KIT BASIN OR (CUSTOM PROCEDURE TRAY) ×2 IMPLANT
KIT POSITION SURG JACKSON T1 (MISCELLANEOUS) ×2 IMPLANT
KIT TURNOVER KIT B (KITS) ×2 IMPLANT
MARKER SKIN DUAL TIP RULER LAB (MISCELLANEOUS) ×4 IMPLANT
MIX DBX 10CC 35% BONE (Bone Implant) ×2 IMPLANT
NEEDLE 18GX1X1/2 (RX/OR ONLY) (NEEDLE) ×2 IMPLANT
NEEDLE 22X1 1/2 (OR ONLY) (NEEDLE) ×2 IMPLANT
NEEDLE HYPO 25GX1X1/2 BEV (NEEDLE) ×2 IMPLANT
NEEDLE SPNL 18GX3.5 QUINCKE PK (NEEDLE) ×4 IMPLANT
NS IRRIG 1000ML POUR BTL (IV SOLUTION) ×2 IMPLANT
OIL CARTRIDGE MAESTRO DRILL (MISCELLANEOUS) ×2
PACK LAMINECTOMY ORTHO (CUSTOM PROCEDURE TRAY) ×2 IMPLANT
PACK UNIVERSAL I (CUSTOM PROCEDURE TRAY) ×2 IMPLANT
PAD ARMBOARD 7.5X6 YLW CONV (MISCELLANEOUS) ×4 IMPLANT
PATTIES SURGICAL .5 X1 (DISPOSABLE) IMPLANT
PATTIES SURGICAL .5X1.5 (GAUZE/BANDAGES/DRESSINGS) IMPLANT
ROD PRE BENT EXP 40MM (Rod) ×4 IMPLANT
SCREW SET SINGLE INNER (Screw) ×8 IMPLANT
SCREW VIPER CORT FIX 6.00X30 (Screw) ×8 IMPLANT
SPONGE INTESTINAL PEANUT (DISPOSABLE) IMPLANT
SPONGE SURGIFOAM ABS GEL 100 (HEMOSTASIS) ×2 IMPLANT
STRIP CLOSURE SKIN 1/2X4 (GAUZE/BANDAGES/DRESSINGS) ×4 IMPLANT
SURGIFLO W/THROMBIN 8M KIT (HEMOSTASIS) IMPLANT
SUT MNCRL AB 4-0 PS2 18 (SUTURE) ×2 IMPLANT
SUT VIC AB 0 CT1 18XCR BRD 8 (SUTURE) ×1 IMPLANT
SUT VIC AB 0 CT1 8-18 (SUTURE) ×1
SUT VIC AB 1 CT1 18XCR BRD 8 (SUTURE) ×1 IMPLANT
SUT VIC AB 1 CT1 8-18 (SUTURE) ×1
SUT VIC AB 2-0 CT2 18 VCP726D (SUTURE) ×4 IMPLANT
SYR 20ML LL LF (SYRINGE) ×2 IMPLANT
SYR BULB IRRIG 60ML STRL (SYRINGE) ×2 IMPLANT
SYR CONTROL 10ML LL (SYRINGE) ×4 IMPLANT
SYR TB 1ML LUER SLIP (SYRINGE) ×2 IMPLANT
TAP EXPEDIUM DL 4.35 (INSTRUMENTS) ×2 IMPLANT
TAP EXPEDIUM DL 5.0 (INSTRUMENTS) ×2 IMPLANT
TAP EXPEDIUM DL 6.0 (INSTRUMENTS) ×2 IMPLANT
TAPE CLOTH SURG 4X10 WHT LF (GAUZE/BANDAGES/DRESSINGS) ×2 IMPLANT
TRAY FOLEY MTR SLVR 16FR STAT (SET/KITS/TRAYS/PACK) ×2 IMPLANT
WATER STERILE IRR 1000ML POUR (IV SOLUTION) ×2 IMPLANT
YANKAUER SUCT BULB TIP NO VENT (SUCTIONS) ×2 IMPLANT

## 2021-03-07 NOTE — Op Note (Addendum)
PATIENT NAME: Denise Gay   MEDICAL RECORD NO.:   [id number]   DATE OF BIRTH: 07/30/1971   DATE OF PROCEDURE: 03/07/2021                               OPERATIVE REPORT     PREOPERATIVE DIAGNOSES: 1. Denise-sided lumbar radiculopathy. 2. Denise Gay spinal stenosis. 3. Status post previous Denise Gay decompression   POSTOPERATIVE DIAGNOSES: 1. Denise-sided lumbar radiculopathy. 2. Denise Gay spinal stenosis. 3. Status post previous Denise Gay decompression   PROCEDURES: 1. Complex revision L4/5 decompression 2. Denise-sided Denise Gay transforaminal lumbar interbody fusion. 3. Left-sided Denise Gay posterolateral fusion. 4. Insertion of interbody device x1 (66mm Concorde intervertebral spacer). 5. Placement of segmental posterior instrumentation L4, Gay bilaterally  6. Use of local autograft. 7. Use of morselized allograft: DBX-mix 8. Intraoperative use of fluoroscopy.   SURGEON:  Phylliss Bob, MD.   ASSISTANTPricilla Holm, PA-C.   ANESTHESIA:  General endotracheal anesthesia.   COMPLICATIONS:  None.   DISPOSITION:  Stable.   ESTIMATED BLOOD LOSS:  100cc   INDICATIONS FOR SURGERY:  Briefly, Denise Gay is a pleasant 50 -year-old female who did present to me with severe and ongoing pain in the Denise leg. I did feel that the symptoms were secondary to the findings noted above.  ` The patient failed conservative care and did wish to proceed with the procedure  noted above.   OPERATIVE DETAILS:  On 03/07/2021, the patient was brought to surgery and general endotracheal anesthesia was administered.  The patient was placed prone on a well-padded flat Denise Gay bed with a spinal frame.  Antibiotics were given and a time-out procedure was performed. The back was prepped and draped in the usual fashion.  A midline incision was made overlying the Denise Gay intervertebral spaces.  The fascia was incised at the midline.  The paraspinal musculature was bluntly swept laterally.  Anatomic landmarks for the pedicles  were exposed. Using fluoroscopy, I did cannulate the L4 and Gay pedicles bilaterally, using a medial to lateral cortical trajectory technique.  At this point, 6 x 30 mm screws were placed into the left pedicles, and a 40 mm rod was placed into the tulip heads of the screw, and caps were also placed.  Distraction was then applied across the Denise Gay intervertebral space, and the caps were then provisionally tightened.  On the Denise side, bone wax was placed into the cannulated pedicle holes.  I then proceeded with the decompressive aspect of the procedure at the Denise Gay level.   Given the patient's previous decompression, this was a very meticulous portion of the procedure, as there was abundant scar and granulation tissue circumferentially about the dura at Denise Gay.  There is also significant scarring in the region of the traversing Denise Gay nerve.  I was ultimately able to develop a plane between the dura and traversing Denise Gay nerve, and the facet joint and disc ventral to the dura and Denise Gay nerve.  This however was very meticulous portion of the procedure, taking approximately 45 minutes longer than usual.  Ultimately, a partial facetectomy was performed bilaterally at Denise Gay, decompressing the Denise Gay intervertebral space.  A protruding disc fragment was noted in the lateral recess as well, which was extended slightly superiorly.  This was removed in its entirety.  I was very pleased with the decompression. With an assistant holding medial retraction of the traversing Denise Gay nerve, I did perform an annulotomy at the posterolateral  aspect of the Denise Gay intervertebral space.  I then used a series of curettes and pituitary rongeurs to perform a thorough and complete intervertebral diskectomy.  The intervertebral space was then liberally packed with autograft as well as allograft in the form of DBX-mix, as was the appropriate-sized intervertebral spacer (9 mm, lordotic).  The spacer was then tamped into position in the  usual fashion.  I was very pleased with the press-fit of the spacer.  I then placed 6 mm screws on the Denise at L4 and Gay. A 40-mm rod was then placed and caps were placed. The distraction was then released on the contralateral side.  All caps were then locked.  The wound was copiously irrigated with a total of approximately 3 L prior to placing the bone graft.  Additional autograft and allograft was then packed into the posterolateral gutter on the left side to help aid in the success of the fusion.  The wound was  explored for any undue bleeding and there was no substantial bleeding encountered.  Gel-Foam was placed over the laminectomy site.  The wound was then closed in layers using #1 Vicryl followed by 2-0 Vicryl, followed by 4-0 Monocryl.  Benzoin and Steri-Strips were applied followed by sterile dressing.      Of note, Pricilla Holm was my assistant throughout surgery, and did aid in retraction, suctioning, placement of the hardware, and closure.       Phylliss Bob, MD

## 2021-03-07 NOTE — Anesthesia Preprocedure Evaluation (Addendum)
Anesthesia Evaluation  Patient identified by MRN, date of birth, ID band Patient awake    Reviewed: Allergy & Precautions, NPO status , Patient's Chart, lab work & pertinent test results  History of Anesthesia Complications Negative for: history of anesthetic complications  Airway Mallampati: II  TM Distance: >3 FB Neck ROM: Full    Dental  (+) Missing,    Pulmonary asthma , former smoker,    Pulmonary exam normal        Cardiovascular hypertension, Pt. on medications Normal cardiovascular exam     Neuro/Psych negative neurological ROS  negative psych ROS   GI/Hepatic Neg liver ROS, GERD  Medicated and Controlled,History of Roux-en-Y gastric bypass   Endo/Other  diabetes, Well Controlled, Type 2  Renal/GU negative Renal ROS  negative genitourinary   Musculoskeletal negative musculoskeletal ROS (+)   Abdominal   Peds  Hematology negative hematology ROS (+)   Anesthesia Other Findings Day of surgery medications reviewed with patient.  Reproductive/Obstetrics negative OB ROS                            Anesthesia Physical Anesthesia Plan  ASA: II  Anesthesia Plan: General   Post-op Pain Management:    Induction: Intravenous  PONV Risk Score and Plan: 4 or greater and Treatment may vary due to age or medical condition, Ondansetron, Dexamethasone and Midazolam  Airway Management Planned: Oral ETT  Additional Equipment:   Intra-op Plan:   Post-operative Plan: Extubation in OR  Informed Consent: I have reviewed the patients History and Physical, chart, labs and discussed the procedure including the risks, benefits and alternatives for the proposed anesthesia with the patient or authorized representative who has indicated his/her understanding and acceptance.     Dental advisory given  Plan Discussed with: CRNA  Anesthesia Plan Comments:        Anesthesia Quick  Evaluation

## 2021-03-07 NOTE — H&P (Signed)
PREOPERATIVE H&P  Chief Complaint: Right leg pain  HPI: Denise Gay is a 50 y.o. female who presents with ongoing pain and weakness in the right leg  MRI reveals severe stenosis and instability at L4/5, the level of the patient's previous decompression  Patient has failed multiple forms of conservative care and continues to have pain (see office notes for additional details regarding the patient's full course of treatment)  Past Medical History:  Diagnosis Date  . Asthma    as a child  . Diabetes mellitus without complication ([place])    was before gastric bypass but patient is not taking any medications for that  . Gastric bypass status for obesity   . History of staph infection    from previous lumbar back surgery  . Hypertension   . Mononucleosis   . Sleep apnea    before gastric bypass no longer wearing CPAP  . Spinal stenosis of lumbar region 01/18/2016   Past Surgical History:  Procedure Laterality Date  . ANTERIOR CERVICAL DECOMP/DISCECTOMY FUSION N/A 05/13/2017   Procedure: ANTERIOR CERVICAL DECOMPRESSION FUSION, CERVICAL 3-4, CERVICAL 4-5 WITH INSTRUMENTATION AND ALLOGRAFT; HARDWARE REMOVAL AT CERVICAL 5-6, CERVICAL 6-7.;  Surgeon: Phylliss Bob, MD;  Location: [place];  Service: Orthopedics;  Laterality: N/A;  ANTERIOR CERVICAL DECOMPRESSION FUSION, CERVICAL 3-4, CERVICAL 4-5 WITH INSTRUMENTATION AND ALLOGRAFT; HARDWARE REMOVAL AT CERVICAL 5-6, CERVICAL 6-  . BREAST REDUCTION SURGERY    . CERVICAL LAMINECTOMY    . CHOLECYSTECTOMY  08/2020  . DIAGNOSTIC LAPAROSCOPY    . GASTRIC BYPASS  08/2011  . lumber surgery    . REDUCTION MAMMAPLASTY     Social History   Socioeconomic History  . Marital status: Married    Spouse name: Not on file  . Number of children: Not on file  . Years of education: Not on file  . Highest education level: Not on file  Occupational History  . Not on file  Tobacco Use  . Smoking status: Former Smoker    Quit date: 12/18/2005     Years since quitting: 15.2  . Smokeless tobacco: Never Used  Vaping Use  . Vaping Use: Never used  Substance and Sexual Activity  . Alcohol use: No    Alcohol/week: 0.0 standard drinks  . Drug use: No  . Sexual activity: Yes  Other Topics Concern  . Not on file  Social History Narrative  . Not on file   Social Determinants of Health   Financial Resource Strain: Not on file  Food Insecurity: Not on file  Transportation Needs: Not on file  Physical Activity: Not on file  Stress: Not on file  Social Connections: Not on file   Family History  Problem Relation Age of Onset  . Diabetes Mother   . Hypertension Father   . Diabetes Brother   . Diabetes Maternal Grandmother    No Known Allergies Prior to Admission medications   Medication Sig Start Date End Date Taking? Authorizing Provider  Biotin 5000 MCG SUBL Place 5,000 mcg under the tongue daily.   Yes [provider]  celecoxib (CELEBREX) 200 MG capsule TAKE 1 TO 2 TABLETS BY MOUTH DAILY AS NEEDED FOR PAIN. Patient taking differently: Take 200 mg by mouth 2 (two) times daily. 04/30/20  Yes Silverio Decamp, MD  cetirizine (ZYRTEC) 10 MG tablet Take 10 mg by mouth daily.   Yes [provider]  cyclobenzaprine (FLEXERIL) 5 MG tablet Take 5 mg by mouth 2 (two) times daily.  01/31/21  Yes [provider]  HYDROcodone-acetaminophen (NORCO) 10-325 MG tablet Take 1 tablet by mouth every 8 (eight) hours as needed. Patient taking differently: Take 1 tablet by mouth every 8 (eight) hours as needed for moderate pain or severe pain. 12/24/20  Yes Silverio Decamp, MD  losartan (COZAAR) 100 MG tablet TAKE 1 TABLET BY MOUTH EVERY DAY Patient taking differently: Take 100 mg by mouth daily. 01/21/21  Yes Silverio Decamp, MD  methocarbamol (ROBAXIN) 750 MG tablet Take 750 mg by mouth 3 (three) times daily. 02/17/21  Yes [provider]  Multiple Vitamins-Minerals (MULTIVITAMIN WITH MINERALS) tablet  Take 1 tablet by mouth daily.   Yes [provider]  omeprazole (PRILOSEC) 20 MG capsule Take 20 mg by mouth daily. 02/26/21  Yes [provider]  prazosin (MINIPRESS) 2 MG capsule Take 2 mg by mouth at bedtime. 02/25/21  Yes [provider]  sertraline (ZOLOFT) 100 MG tablet Take 100 mg by mouth 2 (two) times daily. 07/10/20  Yes [provider]  topiramate (TOPAMAX) 50 MG tablet Take 50 mg by mouth 2 (two) times daily.   Yes [provider]  ziprasidone (GEODON) 20 MG capsule Take 40 mg by mouth 2 (two) times daily. 02/14/21  Yes [provider]  fluticasone (FLONASE) 50 MCG/ACT nasal spray One spray in each nostril twice a day Patient not taking: No sig reported 02/03/20   Silverio Decamp, MD  levocetirizine (XYZAL) 5 MG tablet Take 1 tablet (5 mg total) by mouth every evening. Patient not taking: No sig reported 07/23/20   Silverio Decamp, MD  gabapentin (NEURONTIN) 600 MG tablet Take 1 tablet (600 mg total) by mouth at bedtime. Patient not taking: Reported on 11/15/2020 09/05/19 12/11/20  Rasch, Anderson Malta I, NP  pregabalin (LYRICA) 150 MG capsule Take 1 capsule (150 mg total) by mouth in the morning, at noon, and at bedtime. 08/20/20 12/11/20  Silverio Decamp, MD     All other systems have been reviewed and were otherwise negative with the exception of those mentioned in the HPI and as above.  Physical Exam: There were no vitals filed for this visit.  There is no height or weight on file to calculate BMI.  General: Alert, no acute distress Cardiovascular: No pedal edema Respiratory: No cyanosis, no use of accessory musculature Skin: No lesions in the area of chief complaint Neurologic: Sensation intact distally Psychiatric: Patient is competent for consent with normal mood and affect Lymphatic: No axillary or cervical lymphadenopathy  MUSCULOSKELETAL: + SLR on the right  Assessment/Plan: RIGHT-SIDED LUMBAR  RADICULOPATHY Plan for Procedure(s): RIGHT-SIDED LUMBAR 4- LUMBAR 5 DECOMPRESSION AND TRANSFORAMINAL LUMBAR INTERBODY FUSION WITH INSTRUMENTATION AND ALLOGRAFT   Norva Karvonen, MD 03/07/2021 7:39 AM

## 2021-03-07 NOTE — Anesthesia Procedure Notes (Signed)
Procedure Name: Intubation Date/Time: 03/07/2021 10:36 AM Performed by: Claris Che, CRNA Pre-anesthesia Checklist: Patient identified, Emergency Drugs available, Suction available, Patient being monitored and Timeout performed Patient Re-evaluated:Patient Re-evaluated prior to induction Oxygen Delivery Method: Circle system utilized Preoxygenation: Pre-oxygenation with 100% oxygen Induction Type: IV induction and Cricoid Pressure applied Ventilation: Mask ventilation without difficulty Laryngoscope Size: Mac and 4 Grade View: Grade II Tube type: Oral Tube size: 7.5 mm Number of attempts: 1 Airway Equipment and Method: Stylet Placement Confirmation: ETT inserted through vocal cords under direct vision,  positive ETCO2 and breath sounds checked- equal and bilateral Secured at: 23 cm Tube secured with: Tape Dental Injury: Teeth and Oropharynx as per pre-operative assessment

## 2021-03-07 NOTE — Transfer of Care (Signed)
Immediate Anesthesia Transfer of Care Note  Patient: Denise Gay  Procedure(s) Performed: RIGHT-SIDED LUMBAR 4- LUMBAR 5 DECOMPRESSION AND TRANSFORAMINAL LUMBAR INTERBODY FUSION WITH INSTRUMENTATION AND ALLOGRAFT (Right Spine Lumbar)  Patient Location: PACU  Anesthesia Type:General  Level of Consciousness: awake, alert , oriented and patient cooperative  Airway & Oxygen Therapy: Patient Spontanous Breathing  Post-op Assessment: Report given to RN, Post -op Vital signs reviewed and stable and Patient moving all extremities  Post vital signs: Reviewed and stable  Last Vitals:  Vitals Value Taken Time  BP 154/77 03/07/21 1505  Temp    Pulse 95 03/07/21 1508  Resp 13 03/07/21 1508  SpO2 92 % 03/07/21 1508  Vitals shown include unvalidated device data.  Last Pain:  Vitals:   03/07/21 0906  TempSrc: Oral         Complications: No complications documented.

## 2021-03-08 ENCOUNTER — Other Ambulatory Visit (HOSPITAL_BASED_OUTPATIENT_CLINIC_OR_DEPARTMENT_OTHER): Payer: Self-pay

## 2021-03-08 MED ORDER — METHOCARBAMOL 500 MG PO TABS
500.0000 mg | ORAL_TABLET | Freq: Three times a day (TID) | ORAL | 2 refills | Status: DC | PRN
  Filled 2021-03-08: qty 30, 10d supply, fill #0

## 2021-03-08 MED ORDER — OXYCODONE-ACETAMINOPHEN 5-325 MG PO TABS
1.0000 | ORAL_TABLET | ORAL | 0 refills | Status: DC | PRN
  Filled 2021-03-08: qty 30, 3d supply, fill #0

## 2021-03-08 MED FILL — Thrombin (Recombinant) For Soln 20000 Unit: CUTANEOUS | Qty: 1 | Status: AC

## 2021-03-08 NOTE — Plan of Care (Signed)
Adequately Ready for Discharge 

## 2021-03-08 NOTE — Evaluation (Signed)
Physical Therapy Evaluation Patient Details Name: Denise Gay MRN: [id number] DOB: Sep 18, 1971 Today's Date: 03/08/2021   History of Present Illness  Pt is a 50 y/o female who presents s/p L4-L5 TLIF on 03/07/2021. PMH significant for HTN, gastric bypass, DM, ACDF 2018  Clinical Impression  Pt admitted with above diagnosis. At the time of PT eval, pt was able to demonstrate transfers and ambulation with gross supervision for safety and no AD. Pt still has some decreased DF in the RLE but overall this did not limit her during ambulation. Pt was educated on precautions, brace application/wearing schedule, appropriate activity progression, and car transfer. Pt currently with functional limitations due to the deficits listed below (see PT Problem List). Pt will benefit from skilled PT to increase their independence and safety with mobility to allow discharge to the venue listed below.      Follow Up Recommendations No PT follow up;Supervision for mobility/OOB    Equipment Recommendations  None recommended by PT    Recommendations for Other Services       Precautions / Restrictions Precautions Precautions: Fall;Back Precaution Booklet Issued: Yes (comment) Precaution Comments: Reviewed handout and pt was cued for precautions during functional mobility. Required Braces or Orthoses: Spinal Brace Spinal Brace: Thoracolumbosacral orthotic;Applied in sitting position Restrictions Weight Bearing Restrictions: No      Mobility  Bed Mobility Overal bed mobility: Needs Assistance Bed Mobility: Rolling;Sidelying to Sit Rolling: Modified independent (Device/Increase time) Sidelying to sit: Supervision       General bed mobility comments: VC's for optimal log roll tecnique. HOB flat and rails lowered to simulate home environment.    Transfers Overall transfer level: Needs assistance Equipment used: None Transfers: Sit to/from Stand Sit to Stand: Supervision         General  transfer comment: VC's for hand placement on seated surface for safety. No assist required but close supervision provided for safety.  Ambulation/Gait Ambulation/Gait assistance: Supervision Gait Distance (Feet): 400 Feet Assistive device: None Gait Pattern/deviations: Step-through pattern;Decreased stride length;Trunk flexed Gait velocity: Decreased Gait velocity interpretation: <1.8 ft/sec, indicate of risk for recurrent falls General Gait Details: VC's for improved posture and general safety. Close supervision provided for safety.  Stairs            Wheelchair Mobility    Modified Rankin (Stroke Patients Only)       Balance Overall balance assessment: Needs assistance Sitting-balance support: Feet supported;No upper extremity supported Sitting balance-Leahy Scale: Fair     Standing balance support: No upper extremity supported;During functional activity Standing balance-Leahy Scale: Fair                               Pertinent Vitals/Pain Pain Assessment: Faces Faces Pain Scale: Hurts little more Pain Location: Incision site Pain Descriptors / Indicators: Operative site guarding;Aching Pain Intervention(s): Limited activity within patient's tolerance;Monitored during session;Repositioned    Home Living Family/patient expects to be discharged to:: Private residence Living Arrangements: Spouse/significant other Available Help at Discharge: Family;Available 24 hours/day Type of Home: House Home Access: Stairs to enter   [employer] of Steps: 2 Home Layout: One level Home Equipment: Walker - 2 wheels;Cane - single point;Shower seat      Prior Function Level of Independence: Independent         Comments: Works from home. Reports R foot drop PTA     Hand Dominance        Extremity/Trunk Assessment   Upper  Extremity Assessment Upper Extremity Assessment: Defer to OT evaluation    Lower Extremity Assessment Lower Extremity  Assessment: RLE deficits/detail RLE Deficits / Details: decreased ankle DF actively, however noted decreased passive DF bilaterally.    Cervical / Trunk Assessment Cervical / Trunk Assessment: Other exceptions Cervical / Trunk Exceptions: s/p surgery  Communication   Communication: No difficulties  Cognition Arousal/Alertness: Awake/alert Behavior During Therapy: WFL for tasks assessed/performed Overall Cognitive Status: Within Functional Limits for tasks assessed                                        General Comments      Exercises     Assessment/Plan    PT Assessment Patient needs continued PT services  PT Problem List Decreased strength;Decreased range of motion;Decreased activity tolerance;Decreased balance;Decreased mobility;Decreased knowledge of use of DME;Decreased safety awareness;Decreased knowledge of precautions;Pain       PT Treatment Interventions DME instruction;Gait training;Stair training;Functional mobility training;Therapeutic activities;Therapeutic exercise;Neuromuscular re-education;Patient/family education    PT Goals (Current goals can be found in the Care Plan section)  Acute Rehab PT Goals Patient Stated Goal: Be able to go to the beach PT Goal Formulation: With patient/family Time For Goal Achievement: 03/15/21 Potential to Achieve Goals: Good    Frequency Min 5X/week   Barriers to discharge        Co-evaluation               AM-PAC PT "6 Clicks" Mobility  Outcome Measure Help needed turning from your back to your side while in a flat bed without using bedrails?: None Help needed moving from lying on your back to sitting on the side of a flat bed without using bedrails?: A Little Help needed moving to and from a bed to a chair (including a wheelchair)?: A Little Help needed standing up from a chair using your arms (e.g., wheelchair or bedside chair)?: A Little Help needed to walk in hospital room?: A Little Help  needed climbing 3-5 steps with a railing? : A Little 6 Click Score: 19    End of Session Equipment Utilized During Treatment: Gait belt;Back brace Activity Tolerance: Patient tolerated treatment well Patient left: with family/visitor present (Sitting EOB awaiting OT) Nurse Communication: Mobility status PT Visit Diagnosis: Unsteadiness on feet (R26.81);Pain Pain - Right/Left:  (incision site)    Time: 5701-7793 PT Time Calculation (min) (ACUTE ONLY): 21 min   Charges:   PT Evaluation $PT Eval Low Complexity: 1 Low          Rolinda Roan, PT, DPT Acute Rehabilitation Services Pager: [phone] Office: [phone]   Thelma Comp 03/08/2021, 9:57 AM

## 2021-03-08 NOTE — Progress Notes (Signed)
Patient alert and oriented, voiding adequately, MAE well with no difficulty. Incision area cdi with no s/s of infection. Patient discharged home per order. Patient and husband stated understanding of discharge instructions given. Patient has an appointment with Dr. Lynann Bologna June 1st.

## 2021-03-08 NOTE — Anesthesia Postprocedure Evaluation (Signed)
Anesthesia Post Note  Patient: Denise Gay  Procedure(s) Performed: RIGHT-SIDED LUMBAR 4- LUMBAR 5 DECOMPRESSION AND TRANSFORAMINAL LUMBAR INTERBODY FUSION WITH INSTRUMENTATION AND ALLOGRAFT (Right Spine Lumbar)     Patient location during evaluation: PACU Anesthesia Type: General Level of consciousness: awake and alert Pain management: pain level controlled Vital Signs Assessment: post-procedure vital signs reviewed and stable Respiratory status: spontaneous breathing, nonlabored ventilation, respiratory function stable and patient connected to nasal cannula oxygen Cardiovascular status: blood pressure returned to baseline and stable Postop Assessment: no apparent nausea or vomiting Anesthetic complications: no   No complications documented.  Last Vitals:  Vitals:   03/08/21 0353 03/08/21 0724  BP: 129/80 117/63  Pulse: 73 72  Resp: 20 16  Temp: (!) 36.3 C 36.7 C  SpO2: 93% 95%    Last Pain:  Vitals:   03/08/21 0724  TempSrc: Oral  PainSc:                  [name]

## 2021-03-08 NOTE — Evaluation (Signed)
Occupational Therapy Evaluation Patient Details Name: Denise Gay MRN: [id number] DOB: December 15, 1970 Today's Date: 03/08/2021    History of Present Illness Pt is a 50 y/o female who presents s/p L4-L5 TLIF on 03/07/2021. PMH significant for HTN, gastric bypass, DM, ACDF 2018   Clinical Impression   Patient admitted for the above procedure.  PTA she lives with her spouse who is able to assist as needed.  She was able to complete her own ADL's with increased time and effort.  Hip kit demonstrated, she is close to her baseline for ADL and in room mobility/toileting.  No further acute OT needs identified.  All questions answered.      Follow Up Recommendations  No OT follow up    Equipment Recommendations  None recommended by OT    Recommendations for Other Services       Precautions / Restrictions Precautions Precautions: Fall;Back Precaution Booklet Issued: Yes (comment) Precaution Comments: Reviewed handout and pt was cued for precautions during functional mobility. Required Braces or Orthoses: Spinal Brace Spinal Brace: Thoracolumbosacral orthotic;Applied in sitting position;Applied in standing position Restrictions Weight Bearing Restrictions: No      Mobility Bed Mobility Overal bed mobility: Needs Assistance Bed Mobility: Rolling;Sidelying to Sit Rolling: Modified independent (Device/Increase time) Sidelying to sit: Supervision       General bed mobility comments: sitting edge of bed    Transfers Overall transfer level: Modified independent Equipment used: None Transfers: Sit to/from Stand Sit to Stand: Modified independent (Device/Increase time)         General transfer comment: VC's for hand placement on seated surface for safety. No assist required but close supervision provided for safety.    Balance Overall balance assessment: Needs assistance Sitting-balance support: Feet supported;No upper extremity supported Sitting balance-Leahy Scale: Good      Standing balance support: No upper extremity supported;During functional activity Standing balance-Leahy Scale: Fair                             ADL either performed or assessed with clinical judgement   ADL Overall ADL's : At baseline                                             Vision Baseline Vision/History: Wears glasses Wears Glasses: At all times Patient Visual Report: No change from baseline       Perception     Praxis      Pertinent Vitals/Pain Pain Assessment: Faces Faces Pain Scale: Hurts a little bit Pain Location: Incision site Pain Descriptors / Indicators: Operative site guarding;Aching Pain Intervention(s): Monitored during session     Hand Dominance Right   Extremity/Trunk Assessment Upper Extremity Assessment Upper Extremity Assessment: Overall WFL for tasks assessed   Lower Extremity Assessment Lower Extremity Assessment: Defer to PT evaluation    Cervical / Trunk Assessment Cervical / Trunk Assessment: Other exceptions Cervical / Trunk Exceptions: s/p surgery   Communication Communication Communication: No difficulties   Cognition Arousal/Alertness: Awake/alert Behavior During Therapy: WFL for tasks assessed/performed Overall Cognitive Status: Within Functional Limits for tasks assessed  Home Living Family/patient expects to be discharged to:: Private residence Living Arrangements: Spouse/significant other Available Help at Discharge: Family;Available 24 hours/day Type of Home: House Home Access: Stairs to enter [employer] of Steps: 2   Home Layout: One level     Bathroom Shower/Tub: Walk-in shower         Home Equipment: Environmental consultant - 2 wheels;Cane - single point;Shower seat          Prior Functioning/Environment Level of Independence: Independent        Comments: Works from home.         OT Problem List:  Impaired balance (sitting and/or standing);Pain      OT Treatment/Interventions:      OT Goals(Current goals can be found in the care plan section) Acute Rehab OT Goals Patient Stated Goal: Return home and recover OT Goal Formulation: With patient Time For Goal Achievement: 03/08/21 Potential to Achieve Goals: Good  OT Frequency:     Barriers to D/C:  none noted          Co-evaluation              AM-PAC OT "6 Clicks" Daily Activity     Outcome Measure Help from another person eating meals?: None Help from another person taking care of personal grooming?: None Help from another person toileting, which includes using toliet, bedpan, or urinal?: None Help from another person bathing (including washing, rinsing, drying)?: None Help from another person to put on and taking off regular upper body clothing?: None Help from another person to put on and taking off regular lower body clothing?: A Little 6 Click Score: 23   End of Session Nurse Communication: Mobility status  Activity Tolerance: Patient tolerated treatment well Patient left: in bed;with call bell/phone within reach;with family/visitor present  OT Visit Diagnosis: Unsteadiness on feet (R26.81);Pain                Time: 0981-1914 OT Time Calculation (min): 23 min Charges:  OT General Charges $OT Visit: 1 Visit OT Evaluation $OT Eval Moderate Complexity: 1 Mod OT Treatments $Self Care/Home Management : 8-22 mins  03/08/2021  Rich, OTR/L  Acute Rehabilitation Services  Office:  [place] 03/08/2021, 10:15 AM

## 2021-03-08 NOTE — Progress Notes (Signed)
    Patient doing well  Patient denies leg pain   Physical Exam: Vitals:   03/07/21 2335 03/08/21 0353  BP: 136/78 129/80  Pulse: 70 73  Resp: 18 20  Temp: 97.8 F (36.6 C) (!) 97.4 F (36.3 C)  SpO2: 96% 93%   Patient looks excellent Dressing in place  POD #1 s/p L4/5 decompression and fusion, doing well  - up with PT/OT, encourage ambulation - Percocet for pain, Robaxin for muscle spasms - d/c home today with f/u in 2 weeks

## 2021-03-09 ENCOUNTER — Encounter (HOSPITAL_COMMUNITY): Payer: Self-pay | Admitting: Orthopedic Surgery

## 2021-03-11 ENCOUNTER — Telehealth: Payer: Self-pay | Admitting: General Practice

## 2021-03-11 NOTE — Telephone Encounter (Signed)
Transition Care Management Unsuccessful Follow-up Telephone Call  Date of discharge and from where:  [facility] 03/08/21  Attempts:  1st Attempt  Reason for unsuccessful TCM follow-up call:  Left voice message

## 2021-03-12 NOTE — Telephone Encounter (Signed)
Transition Care Management Unsuccessful Follow-up Telephone Call  Date of discharge and from where:  [facility] 03/08/21  Attempts:  2nd Attempt  Reason for unsuccessful TCM follow-up call:  Left voice message

## 2021-03-13 NOTE — Telephone Encounter (Signed)
Transition Care Management Unsuccessful Follow-up Telephone Call  Date of discharge and from where:  [facility] 03/08/21  Attempts:  3rd Attempt  Reason for unsuccessful TCM follow-up call:  Left voice message

## 2021-03-14 IMAGING — MR MR LUMBAR SPINE W/O CM
4 of 5 series · 25 of 48 positions shown · non-contrast
Comparison: MRI of the lumbar spine 05/05/2016.

CLINICAL DATA: Low back pain, progressive neurologic defect.
Weakness/numbness of the left thigh.

EXAM:
MRI LUMBAR SPINE WITHOUT CONTRAST
TECHNIQUE: Multiplanar, multisequence MR imaging of the lumbar spine was
performed. No intravenous contrast was administered.

[Series 2: T2 · sagittal · 4.0mm · 0.81mm/px · 6 of 15 slices shown (1 of 2)]
[im 1/15]
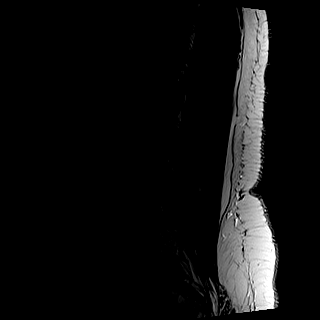
[im 3/15]
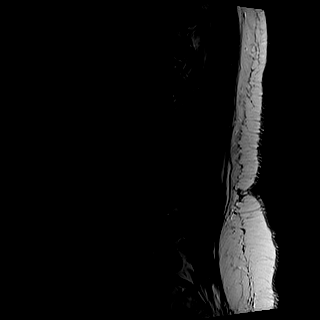
[im 6/15]
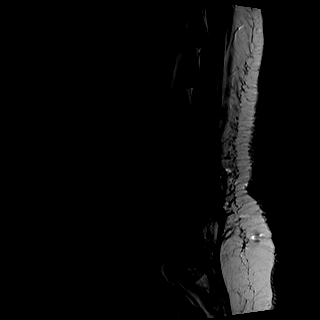
[im 9/15]
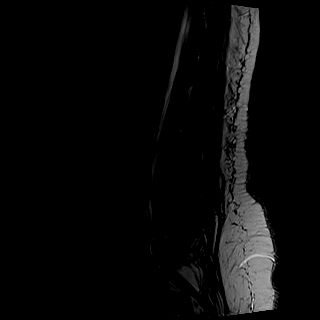
[im 12/15]
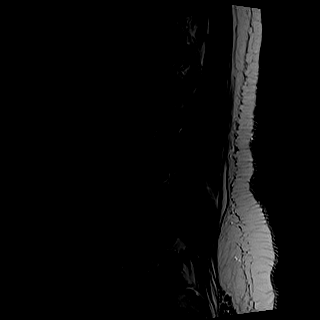
[im 15/15]
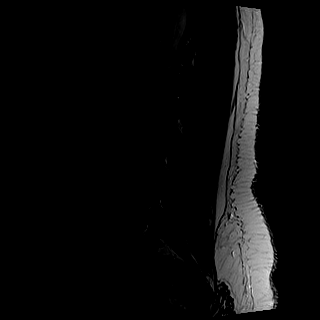

[Series 3: T1 · sagittal · 4.0mm · 0.41mm/px · 6 of 15 slices shown (1 of 2)]
[im 1/15]
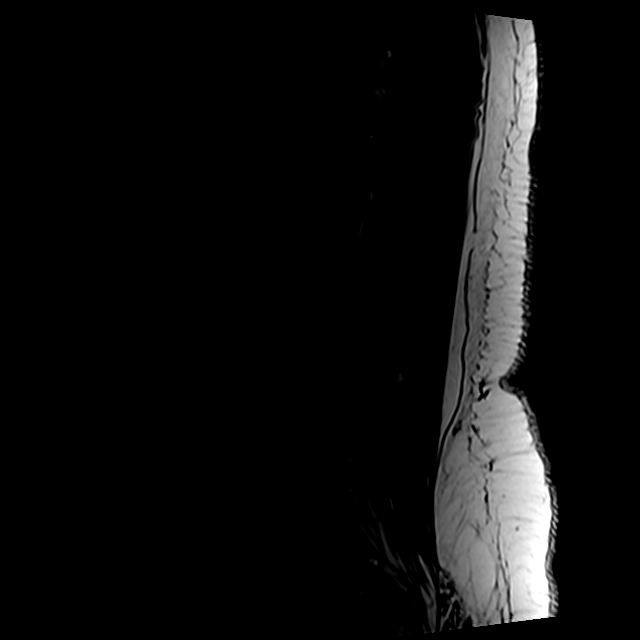
[im 3/15]
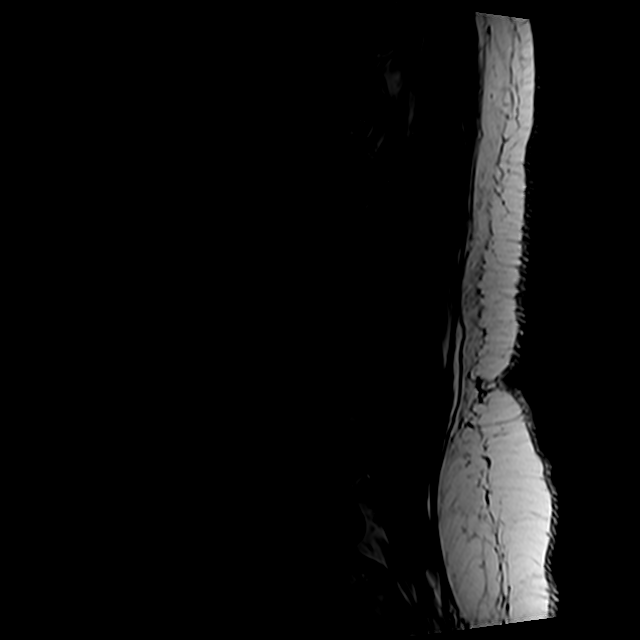
[im 6/15]
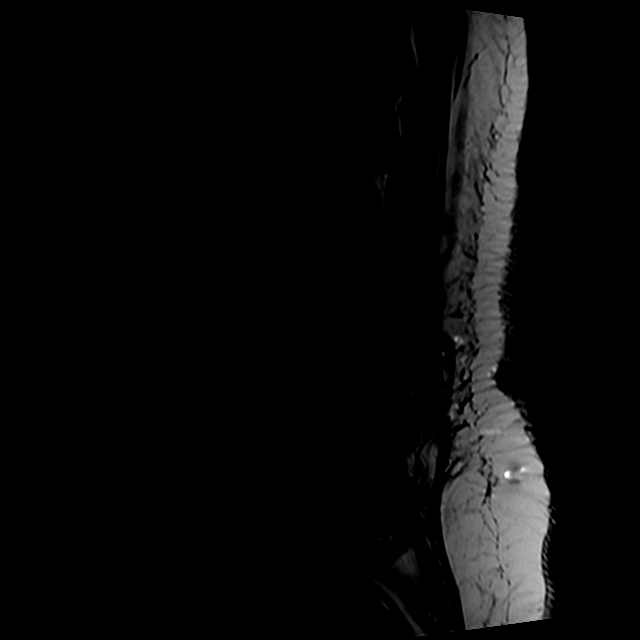
[im 9/15]
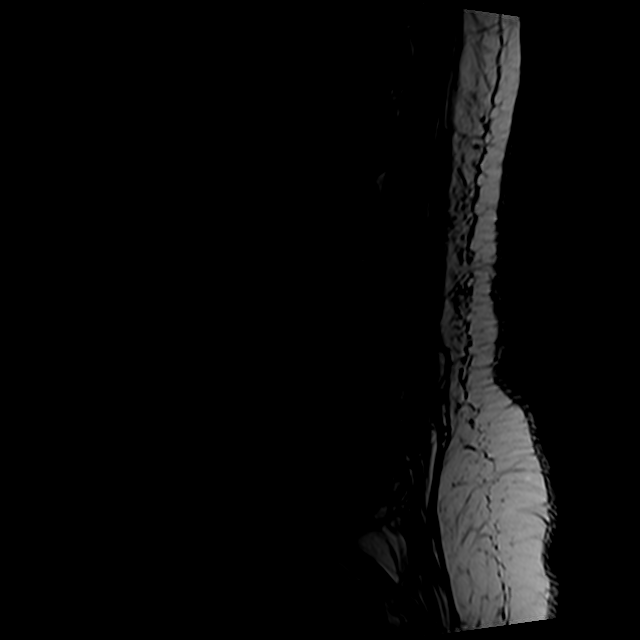
[im 12/15]
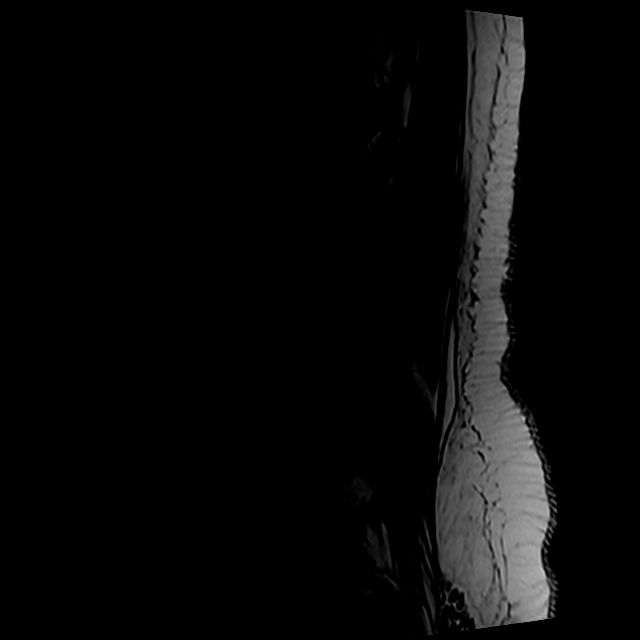
[im 15/15]
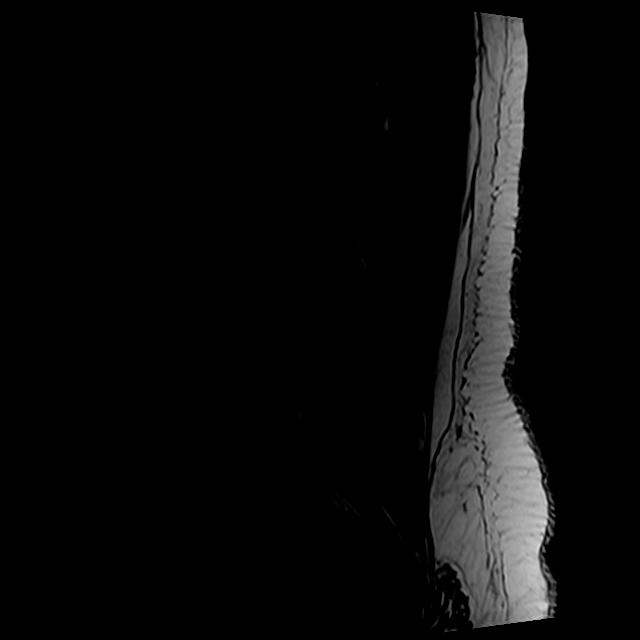

[Series 5: T2 · axial · 4.0mm · 0.78mm/px · z∈[-113,+101]mm · 9 of 39 slices shown (2 of 2)]
[im 1/39]
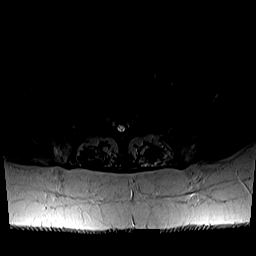
[im 6/39]
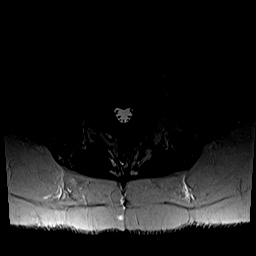
[im 11/39]
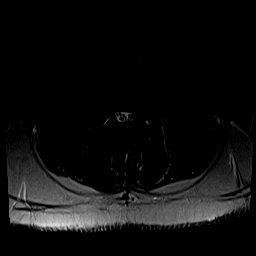
[im 17/39]
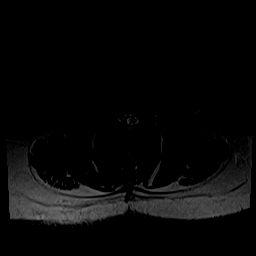
[im 20/39]
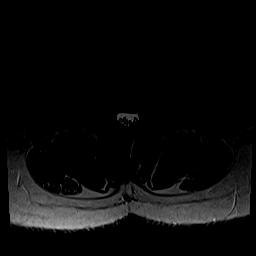
[im 22/39]
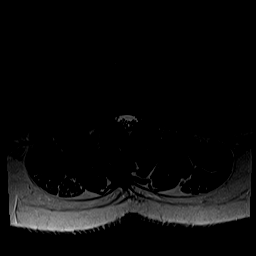
[im 28/39]
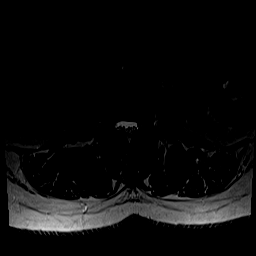
[im 33/39]
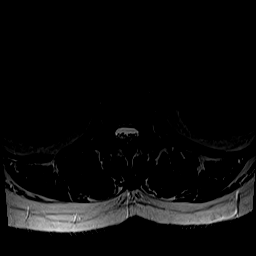
[im 39/39]
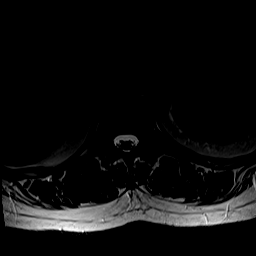

[Series 6: T1 · axial · 4.0mm · 0.39mm/px · z∈[-113,+71]mm · 4 of 39 slices shown (2 of 2)]
[im 1/39]
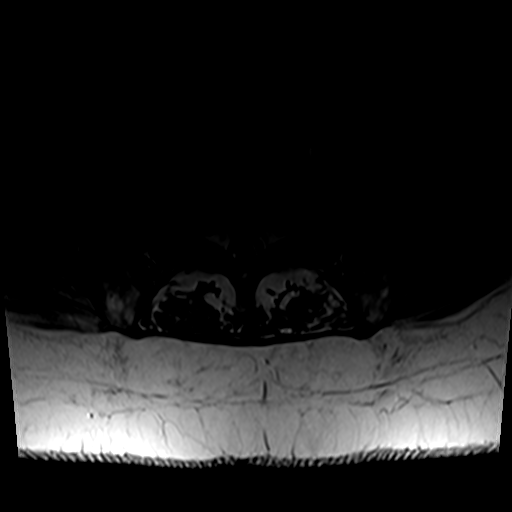
[im 6/39]
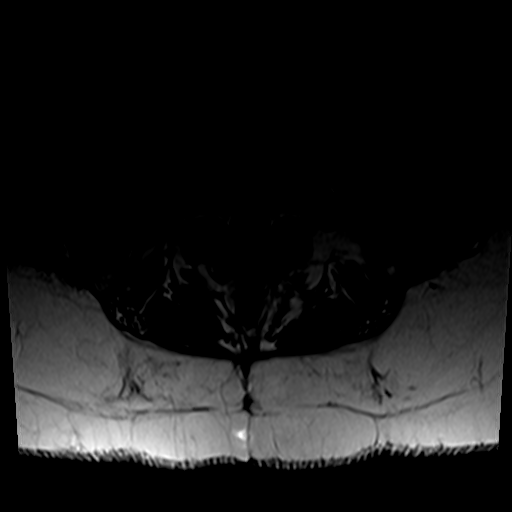
[im 20/39]
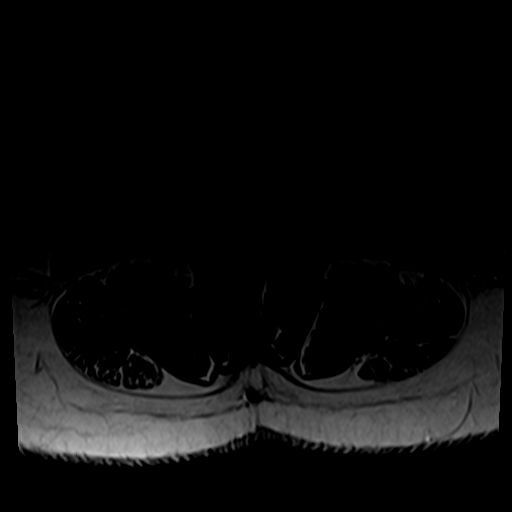
[im 33/39]
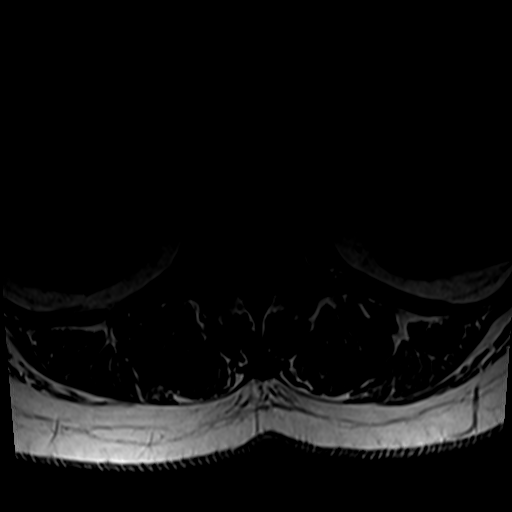

[25 of 48 positions shown; findings below may reference images not displayed]

FINDINGS: Segmentation:  Standard.

Alignment:  Physiologic.

Vertebrae: No evidence of acute fracture, discitis/osteomyelitis, or
suspicious primary bone lesion. Prior spinous process fractures of
L4 and L5 with mild associated edema at L4.

Conus medullaris and cauda equina: Conus extends to the L1 level.
Conus appears normal.

Paraspinal and other soft tissues: Similar prominent lymphatics.
Left parapelvic renal cysts. Otherwise, unremarkable.

Disc levels:

T10-T11 is only imaged on sagittal sequences without significant
canal or foraminal stenosis.

T12-L1: No significant disc protrusion, foraminal stenosis, or canal
stenosis.

L1-L2: No significant disc protrusion, foraminal stenosis, or canal
stenosis.

L2-L3: No significant disc protrusion, foraminal stenosis, or canal
stenosis.

L3-L4: Previous posterior decompression. Slightly right eccentric
broad-based disc bulge, bilateral facet hypertrophy, and ligamentum
flavum thickening. Moderate to severe canal stenosis with redundancy
of cauda equina nerve roots superior to this level. The degree of
canal stenosis is slightly progressed from prior. Mild bilateral
foraminal stenosis.

L4-L5: Previous posterior decompression. Left greater than right
facet hypertrophy. Broad-based disc bulge with superimposed left
foraminal disc protrusion. Moderate left and mild right foraminal
stenosis. Mild central canal stenosis.

L5-S1: Bilateral facet hypertrophy without significant canal or
foraminal stenosis.
IMPRESSION: 1. At L3-L4 there is moderate to severe canal stenosis, slightly
progressed from prior. Mild bilateral foraminal stenosis at this
level.
2. At L4-L5 there is moderate left and mild right foraminal
stenosis, similar to prior.
3. Prior L4 and L5 spinous process fractures.

## 2021-03-15 NOTE — Discharge Summary (Signed)
Patient ID: Denise Gay MRN: [id number] DOB/AGE: 1971-02-14 50 y.o.  Admit date: 03/07/2021 Discharge date: 03/08/2021  Admission Diagnoses:  Active Problems:   Radiculopathy   Discharge Diagnoses:  Same  Past Medical History:  Diagnosis Date  . Asthma    as a child  . Diabetes mellitus without complication ([place])    was before gastric bypass but patient is not taking any medications for that  . Gastric bypass status for obesity   . History of staph infection    from previous lumbar back surgery  . Hypertension   . Mononucleosis   . Sleep apnea    before gastric bypass no longer wearing CPAP  . Spinal stenosis of lumbar region 01/18/2016    Surgeries: Procedure(s): RIGHT-SIDED LUMBAR 4- LUMBAR 5 DECOMPRESSION AND TRANSFORAMINAL LUMBAR INTERBODY FUSION WITH INSTRUMENTATION AND ALLOGRAFT on 03/07/2021   Consultants: None  Discharged Condition: Improved  Hospital Course: Denise Gay is an 50 y.o. female who was admitted 03/07/2021 for operative treatment of radiculpathy. Patient has severe unremitting pain that affects sleep, daily activities, and work/hobbies. After pre-op clearance the patient was taken to the operating room on 03/07/2021 and underwent  Procedure(s): RIGHT-SIDED LUMBAR 4- LUMBAR 5 DECOMPRESSION AND TRANSFORAMINAL LUMBAR INTERBODY FUSION WITH INSTRUMENTATION AND ALLOGRAFT.    Patient was given perioperative antibiotics:  Anti-infectives (From admission, onward)   Start     Dose/Rate Route Frequency Ordered Stop   03/07/21 1900  ceFAZolin (ANCEF) IVPB 2g/100 mL premix        2 g 200 mL/hr over 30 Minutes Intravenous Every 8 hours 03/07/21 1702 03/08/21 0417   03/07/21 0945  ceFAZolin (ANCEF) IVPB 2g/100 mL premix        2 g 200 mL/hr over 30 Minutes Intravenous On call to O.R. 03/07/21 0942 03/07/21 1050   03/07/21 0944  ceFAZolin (ANCEF) 2-4 GM/100ML-% IVPB       Note to Pharmacy: Tressia Miners   : cabinet override      03/07/21 0944  03/07/21 1040       Patient was given sequential compression devices, early ambulation to prevent DVT.  Patient benefited maximally from hospital stay and there were no complications.    Recent vital signs: BP 117/63 (BP Location: Right Arm)   Pulse 72   Temp 98.1 F (36.7 C) (Oral)   Resp 16   Ht 5\' 6"  (1.676 m)   Wt 93.4 kg   SpO2 95%   BMI 33.25 kg/m    Discharge Medications:   Allergies as of 03/08/2021   No Known Allergies     Medication List    STOP taking these medications   HYDROcodone-acetaminophen 10-325 MG tablet Commonly known as: NORCO     TAKE these medications   Biotin 5000 MCG Subl Place 5,000 mcg under the tongue daily.   cetirizine 10 MG tablet Commonly known as: ZYRTEC Take 10 mg by mouth daily.   cyclobenzaprine 5 MG tablet Commonly known as: FLEXERIL Take 5 mg by mouth 2 (two) times daily.   fluticasone 50 MCG/ACT nasal spray Commonly known as: Flonase One spray in each nostril twice a day   levocetirizine 5 MG tablet Commonly known as: Xyzal Take 1 tablet (5 mg total) by mouth every evening.   losartan 100 MG tablet Commonly known as: COZAAR TAKE 1 TABLET BY MOUTH EVERY DAY   methocarbamol 750 MG tablet Commonly known as: ROBAXIN Take 750 mg by mouth 3 (three) times daily. What changed: Another medication with  the same name was added. Make sure you understand how and when to take each.   methocarbamol 500 MG tablet Commonly known as: ROBAXIN Take 1 tablet (500 mg total) by mouth every 8 (eight) hours as needed for muscle spasms. What changed: You were already taking a medication with the same name, and this prescription was added. Make sure you understand how and when to take each.   multivitamin with minerals tablet Take 1 tablet by mouth daily.   omeprazole 20 MG capsule Commonly known as: PRILOSEC Take 20 mg by mouth daily.   oxyCODONE-acetaminophen 5-325 MG tablet Commonly known as: PERCOCET/ROXICET Take 1-2 tablets by  mouth every 4 (four) hours as needed for severe pain.   prazosin 2 MG capsule Commonly known as: MINIPRESS Take 2 mg by mouth at bedtime.   sertraline 100 MG tablet Commonly known as: ZOLOFT Take 100 mg by mouth 2 (two) times daily.   topiramate 50 MG tablet Commonly known as: TOPAMAX Take 50 mg by mouth 2 (two) times daily.   ziprasidone 20 MG capsule Commonly known as: GEODON Take 40 mg by mouth 2 (two) times daily.       Diagnostic Studies: DG Lumbar Spine 2-3 Views  Result Date: 03/07/2021 CLINICAL DATA:  Surgery, elective. Additional history provided: Right-sided lumbar 4-lumbar 5 decompression and transforaminal lumbar interbody fusion with instrumentation and allograft. Provided fluoroscopy time 38 seconds (22.61 mGy). EXAM: LUMBAR SPINE - 2-3 VIEW; DG C-ARM 1-60 MIN COMPARISON:  Lumbar spine MRI 12/31/2020. FINDINGS: AP and lateral view intraoperative fluoroscopic images of the lumbar spine are submitted, 2 images total. The lowest well-formed intervertebral disc space is designated L5-S1. On the provided images, a posterior spinal fusion construct is now present at L4-L5 (bilateral pedicle screws and vertical interconnecting rods). Interbody device(s) also now present at L4-L5. IMPRESSION: Two intraoperative fluoroscopic images of the lumbar spine from L4-L5 fusion, as described. Electronically Signed   By: Kellie Simmering DO   On: 03/07/2021 15:13   DG Lumbar Spine 1 View  Result Date: 03/07/2021 CLINICAL DATA:  Surgery localization. EXAM: LUMBAR SPINE - 1 VIEW COMPARISON:  MRI of the lumbar spine from Mar 02, 2021. FINDINGS: Intraoperative radiograph with two localizing needles. The superior most needle projects at the L3 spinous process posteriorly. The inferior needle projects posteriorly at the L5 level posteriorly. Normal alignment. Vertebral body heights are maintained. Lower lumbar degenerative change, better characterized on recent MRI. IMPRESSION: Intraoperative localizing  radiograph, as detailed above. Electronically Signed   By: Margaretha Sheffield MD   On: 03/07/2021 13:51   DG C-Arm 1-60 Min  Result Date: 03/07/2021 CLINICAL DATA:  Surgery, elective. Additional history provided: Right-sided lumbar 4-lumbar 5 decompression and transforaminal lumbar interbody fusion with instrumentation and allograft. Provided fluoroscopy time 38 seconds (22.61 mGy). EXAM: LUMBAR SPINE - 2-3 VIEW; DG C-ARM 1-60 MIN COMPARISON:  Lumbar spine MRI 12/31/2020. FINDINGS: AP and lateral view intraoperative fluoroscopic images of the lumbar spine are submitted, 2 images total. The lowest well-formed intervertebral disc space is designated L5-S1. On the provided images, a posterior spinal fusion construct is now present at L4-L5 (bilateral pedicle screws and vertical interconnecting rods). Interbody device(s) also now present at L4-L5. IMPRESSION: Two intraoperative fluoroscopic images of the lumbar spine from L4-L5 fusion, as described. Electronically Signed   By: Kellie Simmering DO   On: 03/07/2021 15:13    Disposition: Discharge disposition: 01-Home or Self Care        POD #1 s/p L4/5 decompression and fusion,  doing well  - up with PT/OT, encourage ambulation - Percocet for pain, Robaxin for muscle spasms -Scripts for pain sent to pharmacy electronically  -D/C instructions sheet printed and in chart -D/C today  -F/U in office 2 weeks   Signed: Lennie Muckle Denise Gay 03/15/2021, 9:05 AM

## 2021-03-18 ENCOUNTER — Other Ambulatory Visit: Payer: Self-pay | Admitting: Sports Medicine

## 2021-03-18 DIAGNOSIS — Z9889 Other specified postprocedural states: Secondary | ICD-10-CM

## 2021-03-29 ENCOUNTER — Other Ambulatory Visit: Payer: Self-pay | Admitting: Sports Medicine

## 2021-03-29 DIAGNOSIS — I10 Essential (primary) hypertension: Secondary | ICD-10-CM

## 2021-06-28 ENCOUNTER — Emergency Department
Admission: EM | Admit: 2021-06-28 | Discharge: 2021-06-28 | Disposition: A | Discharge status: Home / Self Care | Payer: 59 | Source: Home / Self Care

## 2021-06-28 ENCOUNTER — Encounter: Payer: Self-pay | Admitting: Emergency Medicine

## 2021-06-28 ENCOUNTER — Other Ambulatory Visit: Payer: Self-pay

## 2021-06-28 DIAGNOSIS — R059 Cough, unspecified: Secondary | ICD-10-CM

## 2021-06-28 DIAGNOSIS — J029 Acute pharyngitis, unspecified: Secondary | ICD-10-CM

## 2021-06-28 DIAGNOSIS — R5383 Other fatigue: Secondary | ICD-10-CM

## 2021-06-28 LAB — POC SARS CORONAVIRUS 2 AG -  ED: SARS Coronavirus 2 Ag: NEGATIVE

## 2021-06-28 MED ORDER — BENZONATATE 200 MG PO CAPS: 200.0000 mg | ORAL_CAPSULE | Freq: Three times a day (TID) | ORAL | 0 refills | Status: AC | PRN

## 2021-06-28 MED ORDER — METHYLPREDNISOLONE SODIUM SUCC 125 MG IJ SOLR
125.0000 mg | Freq: Once | INTRAMUSCULAR | Status: AC
  Administered 2021-06-28: 125 mg via INTRAMUSCULAR
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
## 2021-06-28 NOTE — Discharge Instructions (Addendum)
Advised patient we will follow-up with her once lab results are returned.  Advised conservative measures for now may alternate OTC Tylenol 1000 mg 1-2 times daily, as needed with OTC Ibuprofen 600 mg 1-2 times daily, as needed for sore throat, fever, and myalgias.
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
## 2021-06-30 LAB — COVID-19, FLU A+B NAA
Influenza A, NAA: NOT DETECTED
Influenza B, NAA: NOT DETECTED
SARS-CoV-2, NAA: NOT DETECTED

## 2021-07-15 ENCOUNTER — Emergency Department (INDEPENDENT_AMBULATORY_CARE_PROVIDER_SITE_OTHER)
Admission: EM | Admit: 2021-07-15 | Discharge: 2021-07-15 | Disposition: A | Discharge status: Home / Self Care | Payer: Self-pay | Source: Home / Self Care | Attending: Family Medicine | Admitting: Family Medicine

## 2021-07-15 ENCOUNTER — Emergency Department (INDEPENDENT_AMBULATORY_CARE_PROVIDER_SITE_OTHER): Payer: 59

## 2021-07-15 DIAGNOSIS — M545 Low back pain, unspecified: Secondary | ICD-10-CM | POA: Diagnosis not present

## 2021-07-15 DIAGNOSIS — M5440 Lumbago with sciatica, unspecified side: Secondary | ICD-10-CM

## 2021-07-15 DIAGNOSIS — M79605 Pain in left leg: Secondary | ICD-10-CM

## 2021-07-15 DIAGNOSIS — S161XXA Strain of muscle, fascia and tendon at neck level, initial encounter: Secondary | ICD-10-CM

## 2021-07-15 MED ORDER — METHYLPREDNISOLONE 4 MG PO TBPK: ORAL_TABLET | ORAL | 0 refills | Status: DC

## 2021-07-15 MED ORDER — HYDROCODONE-ACETAMINOPHEN 7.5-325 MG PO TABS: 1.0000 | ORAL_TABLET | Freq: Four times a day (QID) | ORAL | 0 refills | Status: DC | PRN

## 2021-07-15 MED ORDER — METHOCARBAMOL 500 MG PO TABS: 500.0000 mg | ORAL_TABLET | Freq: Three times a day (TID) | ORAL | 0 refills | Status: DC | PRN
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
## 2021-07-15 NOTE — Discharge Instructions (Addendum)
Take the Medrol Dosepak as directed.  Take all of day 1 today.  This is a steroid to reduce nerve inflammation Take hydrocodone as needed for pain.  Do not drive on hydrocodone I have refilled your methocarbamol (muscle relaxer) May use ice or heat to painful muscles Follow-up if you fail to improve over the next few days, with Dr Dianah Field
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
## 2021-07-19 ENCOUNTER — Ambulatory Visit: Payer: 59 | Admitting: Sports Medicine

## 2021-07-19 ENCOUNTER — Other Ambulatory Visit: Payer: Self-pay

## 2021-07-19 DIAGNOSIS — M48061 Spinal stenosis, lumbar region without neurogenic claudication: Secondary | ICD-10-CM

## 2021-07-19 MED ORDER — HYDROCODONE-ACETAMINOPHEN 10-325 MG PO TABS: 1.0000 | ORAL_TABLET | Freq: Three times a day (TID) | ORAL | 0 refills | Status: DC | PRN
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
## 2021-08-20 ENCOUNTER — Other Ambulatory Visit: Payer: Self-pay

## 2021-08-22 ENCOUNTER — Other Ambulatory Visit: Payer: Self-pay

## 2021-08-22 ENCOUNTER — Emergency Department
Admission: RE | Admit: 2021-08-22 | Discharge: 2021-08-22 | Disposition: A | Discharge status: Home / Self Care | Payer: 59 | Source: Ambulatory Visit | Attending: Family Medicine | Admitting: Family Medicine

## 2021-08-22 VITALS — BP 95/67 | HR 83 | Temp 98.3°F | Resp 14

## 2021-08-22 DIAGNOSIS — S99929A Unspecified injury of unspecified foot, initial encounter: Secondary | ICD-10-CM

## 2021-08-22 DIAGNOSIS — S99921A Unspecified injury of right foot, initial encounter: Secondary | ICD-10-CM | POA: Diagnosis not present

## 2021-08-22 MED ORDER — MUPIROCIN 2 % EX OINT: 1.0000 "application " | TOPICAL_OINTMENT | Freq: Two times a day (BID) | CUTANEOUS | 0 refills | Status: AC

## 2021-08-22 NOTE — Telephone Encounter (Signed)
We have not prescribed this medication for the patient previously.  Please review and refill if appropriate.  T. Tiya Schrupp, CMA  

## 2021-08-22 NOTE — ED Triage Notes (Signed)
Pt presents with a right great toe injury with bleeding after hitting it on a chair on Sunday. Pt states toe nail is loose and bleeding

## 2021-08-22 NOTE — Discharge Instructions (Signed)
Wash toe twice a day and apply Bactroban ointment.  Be sure to get this up underneath the nail.  This will help prevent infection Trim toenail back to edge of toe to prevent it hitting against anything else Return as needed

## 2021-08-22 NOTE — ED Provider Notes (Signed)
[facility]    CSN: [id number] Arrival date & time: 08/22/21  0855      History   Chief Complaint Chief Complaint  Patient presents with   toe nail injury    Rt big toe    HPI Denise Gay is a 50 y.o. female.   HPI  Patient accidentally kicked a piece of furniture and lifted at the end of her toenail.  It bled a lot initially.  Is still continuing to ooze.  It is painful.  She would like to have this evaluated Her last tetanus was 2018  Past Medical History:  Diagnosis Date   Asthma    as a child   Diabetes mellitus without complication ([place])    was before gastric bypass but patient is not taking any medications for that   Gastric bypass status for obesity    History of staph infection    from previous lumbar back surgery   Hypertension    Mononucleosis    Sleep apnea    before gastric bypass no longer wearing CPAP   Spinal stenosis of lumbar region 01/18/2016    Patient Active Problem List   Diagnosis Date Noted   Trochanteric bursitis, right hip 11/30/2020   Bipolar affective disorder, currently depressed, moderate ([place]) 07/27/2020   Allergic rhinitis 02/03/2020   Hot flashes due to menopause 09/05/2019   Postmenopausal hormone therapy 09/05/2019   Radiculopathy 05/13/2017   Otitis media 09/01/2016   Obesity 03/10/2016   Spinal stenosis of lumbar region 01/18/2016   Progressive focal motor weakness 01/15/2016   H/O C3-C7 Fusion 04/30/2015   Annual physical exam 04/30/2015   History of Roux-en-Y gastric bypass 11/28/2014   Essential hypertension, benign 11/28/2014    Past Surgical History:  Procedure Laterality Date   ANTERIOR CERVICAL DECOMP/DISCECTOMY FUSION N/A 05/13/2017   Procedure: ANTERIOR CERVICAL DECOMPRESSION FUSION, CERVICAL 3-4, CERVICAL 4-5 WITH INSTRUMENTATION AND ALLOGRAFT; HARDWARE REMOVAL AT CERVICAL 5-6, CERVICAL 6-7.;  Surgeon: Phylliss Bob, MD;  Location: [place];  Service: Orthopedics;  Laterality: N/A;  ANTERIOR  CERVICAL DECOMPRESSION FUSION, CERVICAL 3-4, CERVICAL 4-5 WITH INSTRUMENTATION AND ALLOGRAFT; HARDWARE REMOVAL AT CERVICAL 5-6, CERVICAL 6-   BREAST REDUCTION SURGERY     CERVICAL LAMINECTOMY     CHOLECYSTECTOMY  08/2020   DIAGNOSTIC LAPAROSCOPY     GASTRIC BYPASS  08/2011   lumber surgery     REDUCTION MAMMAPLASTY     TRANSFORAMINAL LUMBAR INTERBODY FUSION (TLIF) WITH PEDICLE SCREW FIXATION 1 LEVEL Right 03/07/2021   Procedure: RIGHT-SIDED LUMBAR 4- LUMBAR 5 DECOMPRESSION AND TRANSFORAMINAL LUMBAR INTERBODY FUSION WITH INSTRUMENTATION AND ALLOGRAFT;  Surgeon: Phylliss Bob, MD;  Location: [place];  Service: Orthopedics;  Laterality: Right;    OB History     Gravida  2   Para  0   Term      Preterm      AB  2   Living         SAB      IAB  2   Ectopic      Multiple      Live Births               Home Medications    Prior to Admission medications   Medication Sig Start Date End Date Taking? Authorizing Provider  mupirocin ointment (BACTROBAN) 2 % Apply 1 application topically 2 (two) times daily. 08/22/21  Yes Raylene Everts, MD  Biotin 5000 MCG SUBL Place 5,000 mcg under the tongue daily.  [provider]  celecoxib (CELEBREX) 200 MG capsule TAKE 1 TO 2 TABLETS BY MOUTH DAILY AS NEEDED FOR PAIN. 03/19/21   Silverio Decamp, MD  cetirizine (ZYRTEC) 10 MG tablet Take 10 mg by mouth daily.    [provider]  cyclobenzaprine (FLEXERIL) 5 MG tablet Take 5 mg by mouth 2 (two) times daily. 01/31/21   [provider]  fluticasone Asencion Islam) 50 MCG/ACT nasal spray One spray in each nostril twice a day 02/03/20   Silverio Decamp, MD  losartan (COZAAR) 100 MG tablet TAKE 1 TABLET BY MOUTH EVERY DAY 03/29/21   Silverio Decamp, MD  Multiple Vitamins-Minerals (MULTIVITAMIN WITH MINERALS) tablet Take 1 tablet by mouth daily.    [provider]  omeprazole (PRILOSEC) 20 MG capsule Take 20 mg by mouth daily. 02/26/21    [provider]  prazosin (MINIPRESS) 2 MG capsule Take 2 mg by mouth at bedtime. 02/25/21   [provider]  sertraline (ZOLOFT) 100 MG tablet Take 100 mg by mouth 2 (two) times daily. 07/10/20   [provider]  topiramate (TOPAMAX) 50 MG tablet Take 50 mg by mouth 2 (two) times daily.    [provider]  traZODone (DESYREL) 100 MG tablet Take 100 mg by mouth at bedtime as needed. 05/26/21   [provider]  ziprasidone (GEODON) 20 MG capsule Take 40 mg by mouth 2 (two) times daily. 02/14/21   [provider]  gabapentin (NEURONTIN) 600 MG tablet Take 1 tablet (600 mg total) by mouth at bedtime. Patient not taking: Reported on 11/15/2020 09/05/19 12/11/20  Rasch, Anderson Malta I, NP  pregabalin (LYRICA) 150 MG capsule Take 1 capsule (150 mg total) by mouth in the morning, at noon, and at bedtime. 08/20/20 12/11/20  Silverio Decamp, MD    Family History Family History  Problem Relation Age of Onset   Diabetes Mother    Hypertension Father    Diabetes Brother    Diabetes Maternal Grandmother     Social History Social History   Tobacco Use   Smoking status: Former    Types: Cigarettes    Quit date: 12/18/2005    Years since quitting: 15.6   Smokeless tobacco: Never  Vaping Use   Vaping Use: Never used  Substance Use Topics   Alcohol use: No    Alcohol/week: 0.0 standard drinks   Drug use: No     Allergies   Patient has no known allergies.   Review of Systems Review of Systems See HPI  Physical Exam Triage Vital Signs ED Triage Vitals  Enc Vitals Group     BP 08/22/21 0916 95/67     Pulse Rate 08/22/21 0916 83     Resp 08/22/21 0916 14     Temp 08/22/21 0916 98.3 F (36.8 C)     Temp Source 08/22/21 0916 Oral     SpO2 08/22/21 0916 96 %     Weight --      Height --      Head Circumference --      Peak Flow --      Pain Score 08/22/21 0918 4     Pain Loc --      Pain Edu? --      Excl. in [place]? --    No data  found.  Updated Vital Signs BP 95/67 (BP Location: Left Arm)   Pulse 83   Temp 98.3 F (36.8 C) (Oral)   Resp 14   SpO2 96%  Physical Exam Constitutional:      General: She is not in acute distress.    Appearance: She is well-developed.  HENT:     Head: Normocephalic and atraumatic.  Eyes:     Conjunctiva/sclera: Conjunctivae normal.     Pupils: Pupils are equal, round, and reactive to light.  Cardiovascular:     Rate and Rhythm: Normal rate.  Pulmonary:     Effort: Pulmonary effort is normal. No respiratory distress.  Abdominal:     General: There is no distension.     Palpations: Abdomen is soft.  Musculoskeletal:        General: Normal range of motion.     Cervical back: Normal range of motion.  Skin:    General: Skin is warm and dry.     Comments: Gel polish is present on the toes which prevents good evaluation.  To my exam it appears that she has avulsed the distal third of the nail.  The rest of it remains intact to the cuticle.  No bleeding at this time.  Neurological:     Mental Status: She is alert.     Gait: Gait normal.     UC Treatments / Results  Labs (all labs ordered are listed, but only abnormal results are displayed) Labs Reviewed - No data to display  EKG   Radiology No results found.  Procedures Procedures (including critical care time)  Medications Ordered in UC Medications - No data to display  Initial Impression / Assessment and Plan / UC Course  I have reviewed the triage vital signs and the nursing notes.  Pertinent labs & imaging results that were available during my care of the patient were reviewed by me and considered in my medical decision making (see chart for details).     Wound care discussed.  Discussed removal of toenail.  RecommendLetting this grow out.  Infection prevention is emphasized Final Clinical Impressions(s) / UC Diagnoses   Final diagnoses:  Injury of great toenail     Discharge Instructions       Wash toe twice a day and apply Bactroban ointment.  Be sure to get this up underneath the nail.  This will help prevent infection Trim toenail back to edge of toe to prevent it hitting against anything else Return as needed   ED Prescriptions     Medication Sig Dispense Auth. Provider   mupirocin ointment (BACTROBAN) 2 % Apply 1 application topically 2 (two) times daily. 22 g Raylene Everts, MD      PDMP not reviewed this encounter.   Raylene Everts, MD 08/22/21 2049

## 2021-08-23 ENCOUNTER — Other Ambulatory Visit: Payer: Self-pay | Admitting: Sports Medicine

## 2021-08-26 NOTE — Telephone Encounter (Signed)
Never filled by any provider at our practice.

## 2021-08-27 ENCOUNTER — Telehealth: Payer: Self-pay | Admitting: Sports Medicine

## 2021-08-27 MED ORDER — TOPIRAMATE 50 MG PO TABS: 50.0000 mg | ORAL_TABLET | Freq: Two times a day (BID) | ORAL | 3 refills | Status: DC

## 2021-08-27 NOTE — Telephone Encounter (Signed)
Refilled

## 2021-08-27 NOTE — Telephone Encounter (Signed)
Pt called and states that she has been leaving messages on the nurses Voicemail and no one has returned her call and she is completely out of her Topamax and patient states  Dr.Thekkekandam was going to take over her pain management. Please call patient and advise her or update her.

## 2021-08-27 NOTE — Telephone Encounter (Signed)
Left msg on VM that prescription was sent to pharmacy.

## 2021-10-18 ENCOUNTER — Other Ambulatory Visit: Payer: Self-pay | Admitting: Sports Medicine

## 2021-10-18 DIAGNOSIS — J301 Allergic rhinitis due to pollen: Secondary | ICD-10-CM

## 2021-11-04 ENCOUNTER — Other Ambulatory Visit: Payer: Self-pay | Admitting: Sports Medicine

## 2021-11-04 DIAGNOSIS — J301 Allergic rhinitis due to pollen: Secondary | ICD-10-CM

## 2021-11-06 ENCOUNTER — Other Ambulatory Visit: Payer: Self-pay | Admitting: Sports Medicine

## 2021-11-06 DIAGNOSIS — Z1231 Encounter for screening mammogram for malignant neoplasm of breast: Secondary | ICD-10-CM

## 2021-11-21 ENCOUNTER — Ambulatory Visit: Payer: 59

## 2021-11-26 ENCOUNTER — Other Ambulatory Visit: Payer: Self-pay

## 2021-11-26 ENCOUNTER — Ambulatory Visit: Payer: 59 | Admitting: Sports Medicine

## 2021-11-26 ENCOUNTER — Encounter: Payer: Self-pay | Admitting: Sports Medicine

## 2021-11-26 DIAGNOSIS — F3132 Bipolar disorder, current episode depressed, moderate: Secondary | ICD-10-CM

## 2021-11-26 MED ORDER — SERTRALINE HCL 25 MG PO TABS: ORAL_TABLET | ORAL | 0 refills | Status: DC

## 2021-11-26 MED ORDER — ESCITALOPRAM OXALATE 10 MG PO TABS: ORAL_TABLET | ORAL | 11 refills | Status: DC

## 2021-11-26 NOTE — Patient Instructions (Signed)
Exercise prescription is heart rate 140-150 for 30 minutes 5 times a week

## 2021-11-26 NOTE — Progress Notes (Signed)
° ° °  Procedures performed today:    None.  Independent interpretation of notes and tests performed by another provider:   None.  Brief History, Exam, Impression, and Recommendations:    Bipolar affective disorder, currently depressed, moderate ([place]) Denise Gay is a pleasant 51 year old female, chronic bipolar disorder, historically well controlled with high-dose Zoloft, topiramate, trazodone, Geodon. She does have a psychiatrist. More recently she has been having increased anxiety and panic, a lot of this is centered around work, also creating intolerable insomnia. Her psychiatrist did want to add sedative-hypnotic. She is here for further discussion, she does get classic and typical panic symptoms, she has anhedonia, agoraphobia. She has been on Zoloft for a while now, I do think she is experiencing some tachyphylaxis. We will do a down taper of Zoloft, up taper on Lexapro. I did give her an exercise prescription. We will also take her out of work for 2 weeks which would likely trigger FMLA paperwork. Return to see me in 4 weeks. No true suicidal or homicidal ideation.  Chronic process with exacerbation and pharmacologic intervention  ___________________________________________ Gwen Her. Dianah Field, M.D., ABFM., CAQSM. Primary Care and [place] Instructor of [place] [employer] [facility] [employer]

## 2021-11-26 NOTE — Progress Notes (Signed)
Denise Gay is here for increased anxiety. She is currently on Zoloft 100mg  and Geodon 20 mg. She is working on an updated PHQ today.

## 2021-11-26 NOTE — Assessment & Plan Note (Signed)
Denise Gay is a pleasant 51 year old female, chronic bipolar disorder, historically well controlled with high-dose Zoloft, topiramate, trazodone, Geodon. She does have a psychiatrist. More recently she has been having increased anxiety and panic, a lot of this is centered around work, also creating intolerable insomnia. Her psychiatrist did want to add sedative-hypnotic. She is here for further discussion, she does get classic and typical panic symptoms, she has anhedonia, agoraphobia. She has been on Zoloft for a while now, I do think she is experiencing some tachyphylaxis. We will do a down taper of Zoloft, up taper on Lexapro. I did give her an exercise prescription. We will also take her out of work for 2 weeks which would likely trigger FMLA paperwork. Return to see me in 4 weeks. No true suicidal or homicidal ideation.

## 2021-11-27 ENCOUNTER — Telehealth: Payer: Self-pay | Admitting: Sports Medicine

## 2021-11-27 NOTE — Telephone Encounter (Signed)
Patient dropped off Medical Disability FMLA Paperwork. Paperwork placed in providers box. Patient informed of possible fee and 3-5 day turn around. lmr

## 2021-11-28 NOTE — Telephone Encounter (Signed)
FMLA as always needs an appointment.

## 2021-11-29 NOTE — Telephone Encounter (Signed)
Appt. Scheduled.

## 2021-12-04 ENCOUNTER — Ambulatory Visit: Payer: 59 | Admitting: Sports Medicine

## 2021-12-05 ENCOUNTER — Ambulatory Visit (INDEPENDENT_AMBULATORY_CARE_PROVIDER_SITE_OTHER): Payer: 59

## 2021-12-05 ENCOUNTER — Other Ambulatory Visit: Payer: Self-pay

## 2021-12-05 DIAGNOSIS — Z1231 Encounter for screening mammogram for malignant neoplasm of breast: Secondary | ICD-10-CM | POA: Diagnosis not present

## 2021-12-18 ENCOUNTER — Other Ambulatory Visit: Payer: Self-pay | Admitting: Sports Medicine

## 2021-12-18 DIAGNOSIS — F3132 Bipolar disorder, current episode depressed, moderate: Secondary | ICD-10-CM

## 2021-12-24 ENCOUNTER — Other Ambulatory Visit: Payer: Self-pay

## 2021-12-24 ENCOUNTER — Ambulatory Visit: Payer: 59 | Admitting: Sports Medicine

## 2021-12-24 VITALS — BP 128/82 | HR 77 | Wt 200.1 lb

## 2021-12-24 DIAGNOSIS — F3132 Bipolar disorder, current episode depressed, moderate: Secondary | ICD-10-CM | POA: Diagnosis not present

## 2021-12-24 DIAGNOSIS — Z23 Encounter for immunization: Secondary | ICD-10-CM | POA: Diagnosis not present

## 2021-12-24 DIAGNOSIS — Z0000 Encounter for general adult medical examination without abnormal findings: Secondary | ICD-10-CM

## 2021-12-24 MED ORDER — ESCITALOPRAM OXALATE 20 MG PO TABS: 20.0000 mg | ORAL_TABLET | Freq: Every day | ORAL | 3 refills | Status: DC

## 2021-12-24 NOTE — Assessment & Plan Note (Signed)
Shingrix #1 back in November, we will do Shingrix #2 today. ?

## 2021-12-24 NOTE — Assessment & Plan Note (Signed)
Denise Gay returns, she is a pleasant 51 year old female, chronic bipolar disorder historically well controlled with topiramate, trazodone, Geodon, she was on high-dose Zoloft, she does have a psychiatrist. ?More recently she was having increasing anxiety and panic, centered mostly around work, this also created intolerable insomnia. ?I suspected tachyphylaxis from Zoloft so we switched her to Lexapro, she is currently taking 10 mg and does feel better. ?Her psychiatrist had wanted her to discontinue Lexapro and use Effexor instead, I did warn her that this is a problem with having too many cooks in the kitchen. ?I have advised her to hold off on Effexor, we will give Lexapro the due diligence of tapering up to 20 mg and keeping it there for 6 weeks, at the follow-up visit if anxiety is uncontrolled we can consider switching to Effexor. ?

## 2021-12-24 NOTE — Progress Notes (Signed)
? ? ?  Procedures performed today:   ? ?None. ? ?Independent interpretation of notes and tests performed by another provider:  ? ?None. ? ?Brief History, Exam, Impression, and Recommendations:   ? ?Bipolar affective disorder, currently depressed, moderate ([place]) ?Terrence Dupont returns, she is a pleasant 51 year old female, chronic bipolar disorder historically well controlled with topiramate, trazodone, Geodon, she was on high-dose Zoloft, she does have a psychiatrist. ?More recently she was having increasing anxiety and panic, centered mostly around work, this also created intolerable insomnia. ?I suspected tachyphylaxis from Zoloft so we switched her to Lexapro, she is currently taking 10 mg and does feel better. ?Her psychiatrist had wanted her to discontinue Lexapro and use Effexor instead, I did warn her that this is a problem with having too many cooks in the kitchen. ?I have advised her to hold off on Effexor, we will give Lexapro the due diligence of tapering up to 20 mg and keeping it there for 6 weeks, at the follow-up visit if anxiety is uncontrolled we can consider switching to Effexor. ? ?Annual physical exam ?Shingrix #1 back in November, we will do Shingrix #2 today. ? ?Chronic process not at goal with pharmacologic intervention ? ?___________________________________________ ?Gwen Her. Dianah Field, M.D., ABFM., CAQSM. ?Primary Care and Sports Medicine ?[place] ? ?Adjunct Instructor of Family Medicine  ?[employer] ?

## 2021-12-24 NOTE — Addendum Note (Signed)
Addended by: Gust Brooms on: 12/24/2021 09:34 AM ? ? Modules accepted: Orders ? ?

## 2022-01-16 ENCOUNTER — Other Ambulatory Visit: Payer: Self-pay | Admitting: Sports Medicine

## 2022-01-16 DIAGNOSIS — F3132 Bipolar disorder, current episode depressed, moderate: Secondary | ICD-10-CM

## 2022-02-01 ENCOUNTER — Other Ambulatory Visit: Payer: Self-pay | Admitting: Sports Medicine

## 2022-02-01 DIAGNOSIS — J301 Allergic rhinitis due to pollen: Secondary | ICD-10-CM

## 2022-02-01 DIAGNOSIS — F3132 Bipolar disorder, current episode depressed, moderate: Secondary | ICD-10-CM

## 2022-02-04 ENCOUNTER — Ambulatory Visit: Payer: 59 | Admitting: Sports Medicine

## 2022-03-30 ENCOUNTER — Other Ambulatory Visit: Payer: Self-pay | Admitting: Sports Medicine

## 2022-03-30 DIAGNOSIS — Z9889 Other specified postprocedural states: Secondary | ICD-10-CM

## 2022-04-01 ENCOUNTER — Other Ambulatory Visit: Payer: Self-pay | Admitting: Sports Medicine

## 2022-04-01 DIAGNOSIS — I10 Essential (primary) hypertension: Secondary | ICD-10-CM

## 2022-04-01 DIAGNOSIS — J301 Allergic rhinitis due to pollen: Secondary | ICD-10-CM

## 2022-04-01 DIAGNOSIS — F3132 Bipolar disorder, current episode depressed, moderate: Secondary | ICD-10-CM

## 2022-04-11 ENCOUNTER — Encounter: Payer: Self-pay | Admitting: Sports Medicine

## 2022-04-11 ENCOUNTER — Ambulatory Visit: Payer: 59 | Admitting: Sports Medicine

## 2022-04-11 DIAGNOSIS — R1084 Generalized abdominal pain: Secondary | ICD-10-CM | POA: Diagnosis not present

## 2022-04-11 MED ORDER — SIMETHICONE 180 MG PO CAPS: 1.0000 | ORAL_CAPSULE | Freq: Four times a day (QID) | ORAL | 0 refills | Status: AC | PRN

## 2022-04-11 NOTE — Assessment & Plan Note (Signed)
Pleasant 51 year old female, she has had approximately a year of colicky abdominal pain localized periumbilical. She will get flares that last about 20 minutes and double her over in pain. She is postcholecystectomy, she is also post Roux-en-Y gastric bypass. Currently on omeprazole. Exam is benign with the exception of mild right upper quadrant discomfort with deep palpation. No guarding, rigidity. Denies melena, hematochezia, occasional nausea. Pain is not related to any type of food. We will start the work-up with CBC with differential, CMP, amylase, lipase. Abdominal ultrasound. She is already on a PPI so we will not be able to do an H. pylori test. Also adding simethicone. I like to see her back in about a month and if still having significant discomfort we will refer for upper endoscopy.

## 2022-04-11 NOTE — Progress Notes (Signed)
    Procedures performed today:    None.  Independent interpretation of notes and tests performed by another provider:   None.  Brief History, Exam, Impression, and Recommendations:    Acute generalized abdominal pain Pleasant 51 year old female, she has had approximately a year of colicky abdominal pain localized periumbilical. She will get flares that last about 20 minutes and double her over in pain. She is postcholecystectomy, she is also post Roux-en-Y gastric bypass. Currently on omeprazole. Exam is benign with the exception of mild right upper quadrant discomfort with deep palpation. No guarding, rigidity. Denies melena, hematochezia, occasional nausea. Pain is not related to any type of food. We will start the work-up with CBC with differential, CMP, amylase, lipase. Abdominal ultrasound. She is already on a PPI so we will not be able to do an H. pylori test. Also adding simethicone . I like to see her back in about a month and if still having significant discomfort we will refer for upper endoscopy.    ___________________________________________ Denise PARAS. Gay, M.D., ABFM., CAQSM. Primary Care and Sports Medicine [facility]  Adjunct Instructor of Family Medicine  [employer]

## 2022-04-12 LAB — CBC WITH DIFFERENTIAL/PLATELET
Absolute Monocytes: 508 cells/uL (ref 200–950)
Basophils Absolute: 108 cells/uL (ref 0–200)
Basophils Relative: 1.4 %
Eosinophils Absolute: 92 cells/uL (ref 15–500)
Eosinophils Relative: 1.2 %
HCT: 40.7 % (ref 35.0–45.0)
Hemoglobin: 13.4 g/dL (ref 11.7–15.5)
Lymphs Abs: 2102 cells/uL (ref 850–3900)
MCH: 29.9 pg (ref 27.0–33.0)
MCHC: 32.9 g/dL (ref 32.0–36.0)
MCV: 90.8 fL (ref 80.0–100.0)
MPV: 11.3 fL (ref 7.5–12.5)
Monocytes Relative: 6.6 %
Neutro Abs: 4890 cells/uL (ref 1500–7800)
Neutrophils Relative %: 63.5 %
Platelets: 214 10*3/uL (ref 140–400)
RBC: 4.48 10*6/uL (ref 3.80–5.10)
RDW: 12.6 % (ref 11.0–15.0)
Total Lymphocyte: 27.3 %
WBC: 7.7 10*3/uL (ref 3.8–10.8)

## 2022-04-12 LAB — COMPLETE METABOLIC PANEL WITH GFR
AG Ratio: 1.4 (calc) (ref 1.0–2.5)
ALT: 21 U/L (ref 6–29)
AST: 18 U/L (ref 10–35)
Albumin: 4.2 g/dL (ref 3.6–5.1)
Alkaline phosphatase (APISO): 73 U/L (ref 37–153)
BUN: 18 mg/dL (ref 7–25)
CO2: 21 mmol/L (ref 20–32)
Calcium: 9.4 mg/dL (ref 8.6–10.4)
Chloride: 109 mmol/L (ref 98–110)
Creat: 0.71 mg/dL (ref 0.50–1.03)
Globulin: 3 g/dL (calc) (ref 1.9–3.7)
Glucose, Bld: 110 mg/dL — ABNORMAL HIGH (ref 65–99)
Potassium: 4 mmol/L (ref 3.5–5.3)
Sodium: 139 mmol/L (ref 135–146)
Total Bilirubin: 0.3 mg/dL (ref 0.2–1.2)
Total Protein: 7.2 g/dL (ref 6.1–8.1)
eGFR: 102 mL/min/{1.73_m2} (ref >=60)

## 2022-04-12 LAB — AMYLASE: Amylase: 25 U/L (ref 21–101)

## 2022-04-12 LAB — LIPASE: Lipase: 15 U/L (ref 7–60)

## 2022-04-15 ENCOUNTER — Ambulatory Visit (INDEPENDENT_AMBULATORY_CARE_PROVIDER_SITE_OTHER): Payer: 59

## 2022-04-15 DIAGNOSIS — R1084 Generalized abdominal pain: Secondary | ICD-10-CM | POA: Diagnosis not present

## 2022-04-15 LAB — URINALYSIS W MICROSCOPIC + REFLEX CULTURE
Bacteria, UA: NONE SEEN /HPF
Bilirubin Urine: NEGATIVE
Glucose, UA: NEGATIVE
Hyaline Cast: NONE SEEN /LPF
Ketones, ur: NEGATIVE
Leukocyte Esterase: NEGATIVE
Nitrites, Initial: NEGATIVE
Protein, ur: NEGATIVE
Specific Gravity, Urine: 1.02 (ref 1.001–1.035)
pH: 7 (ref 5.0–8.0)

## 2022-04-15 LAB — NO CULTURE INDICATED

## 2022-05-09 ENCOUNTER — Ambulatory Visit: Payer: 59 | Admitting: Sports Medicine

## 2022-07-10 ENCOUNTER — Other Ambulatory Visit: Payer: Self-pay | Admitting: Sports Medicine

## 2022-07-10 DIAGNOSIS — F3132 Bipolar disorder, current episode depressed, moderate: Secondary | ICD-10-CM

## 2022-09-23 ENCOUNTER — Ambulatory Visit: Payer: 59 | Admitting: Family Medicine

## 2022-09-23 NOTE — Progress Notes (Deleted)
   Acute Office Visit  Subjective:     Patient ID: Denise Gay, female    DOB: 1971-08-15, 51 y.o.   MRN: [id number]  No chief complaint on file.   HPI Patient is in today for otalgia.  ROS      Objective:    There were no vitals taken for this visit.   Physical Exam  No results found for any visits on 09/23/22.      Assessment & Plan:   Problem List Items Addressed This Visit   None   No orders of the defined types were placed in this encounter.   No follow-ups on file.  Owens Loffler, DO

## 2022-10-11 ENCOUNTER — Other Ambulatory Visit: Payer: Self-pay | Admitting: Sports Medicine

## 2022-10-11 DIAGNOSIS — F3132 Bipolar disorder, current episode depressed, moderate: Secondary | ICD-10-CM

## 2022-10-17 ENCOUNTER — Other Ambulatory Visit: Payer: Self-pay | Admitting: Sports Medicine

## 2022-10-17 DIAGNOSIS — F3132 Bipolar disorder, current episode depressed, moderate: Secondary | ICD-10-CM

## 2022-10-22 ENCOUNTER — Other Ambulatory Visit: Payer: Self-pay

## 2022-10-22 ENCOUNTER — Encounter: Payer: Self-pay | Admitting: Sports Medicine

## 2022-10-22 DIAGNOSIS — F3132 Bipolar disorder, current episode depressed, moderate: Secondary | ICD-10-CM

## 2022-10-22 MED ORDER — ESCITALOPRAM OXALATE 20 MG PO TABS: 20.0000 mg | ORAL_TABLET | Freq: Every day | ORAL | 1 refills | Status: DC

## 2022-10-24 ENCOUNTER — Encounter: Payer: Self-pay | Admitting: Family Medicine

## 2022-10-24 ENCOUNTER — Telehealth (INDEPENDENT_AMBULATORY_CARE_PROVIDER_SITE_OTHER): Payer: 59 | Admitting: Family Medicine

## 2022-10-24 VITALS — Ht 66.0 in

## 2022-10-24 DIAGNOSIS — U071 COVID-19: Secondary | ICD-10-CM | POA: Diagnosis not present

## 2022-10-24 DIAGNOSIS — R051 Acute cough: Secondary | ICD-10-CM

## 2022-10-24 MED ORDER — BENZONATATE 100 MG PO CAPS: 100.0000 mg | ORAL_CAPSULE | Freq: Three times a day (TID) | ORAL | 0 refills | Status: DC | PRN

## 2022-10-24 NOTE — Progress Notes (Signed)
I connected with  Porche L Gouger on 65/79/03 by a video enabled telemedicine application and verified that I am speaking with the correct person using two identifiers.   I discussed the limitations of evaluation and management by telemedicine. The patient expressed understanding and agreed to proceed.   Acute Office Visit  Subjective:     Patient ID: Denise Gay, female    DOB: 07-13-1971, 52 y.o.   MRN: [id number]  Chief Complaint  Patient presents with   Covid Positive    Symptoms started 1 week ago, tested yesterday. She denies fever, SOB or chest pain. Tokk OTC Thera flu, Alka selter plus, robitussin.    Cough   Sore Throat   Ear Pain    HPI Presents today for an acute visit with complaint of positive Covid test yesterday. Symptoms include: cough, sunny nose, occ. Ear pain, sore throat. Feels "blah" Symptoms have been present for cough started on Sunday.  Associated symptoms include: low grade fever, t-max 99.0 Pertinent negatives: no shortness of breath, no chest pain Pain severity: n/a Treatments tried include : alka seltzer, robitussin, theraflu Treatment effective : has not helped much Sick contacts : went on a cruise   Review of Systems  Constitutional:  Positive for fever (low grade 99.0) and malaise/fatigue. Negative for chills.  HENT:  Positive for congestion and ear pain (occasionally).   Respiratory:  Positive for cough. Negative for shortness of breath.   Cardiovascular:  Negative for chest pain.  Gastrointestinal:  Negative for nausea and vomiting.        Objective:    Ht '5\' 6"'$  (1.676 m)   BMI 31.15 kg/m    Physical Exam Nursing note reviewed.  Constitutional:      General: She is not in acute distress.    Appearance: She is well-developed. She is not ill-appearing.     Comments: No physical exam, telehealth visit.   Pulmonary:     Effort: Pulmonary effort is normal.  Skin:    General: Skin is warm and dry.  Neurological:     Mental  Status: She is alert.     No results found for any visits on 10/24/22.      Assessment & Plan:   Problem List Items Addressed This Visit     COVID-19 - Primary   Relevant Medications   benzonatate (TESSALON) 100 MG capsule   Other Visit Diagnoses     Acute cough       Relevant Medications   benzonatate (TESSALON) 100 MG capsule     Day # 5 of symptoms which are mild. No shortness of breath, no chest pain. Out of window for anti-viral treatment. Able to converse with provider during visit without signs of dyspnea.  Benzonatate 100 mg sent to pharmacy for cough. Continue supportive and symptomatic treatment. Reviewed quarantine guidelines.   Agrees with plan of care discussed today. Questions answered. Will follow-up if symptoms worsen. Work note sent through My Chart.   Meds ordered this encounter  Medications   benzonatate (TESSALON) 100 MG capsule    Sig: Take 1 capsule (100 mg total) by mouth 3 (three) times daily as needed for cough.    Dispense:  30 capsule    Refill:  0    Order Specific Question:   Supervising Provider    Answer:   Leeanne Rio [[id number]]    No follow-ups on file.  Chalmers Guest, FNP

## 2023-02-24 ENCOUNTER — Other Ambulatory Visit: Payer: Self-pay | Admitting: Sports Medicine

## 2023-02-24 DIAGNOSIS — Z1231 Encounter for screening mammogram for malignant neoplasm of breast: Secondary | ICD-10-CM

## 2023-02-25 ENCOUNTER — Ambulatory Visit: Payer: 59

## 2023-04-01 ENCOUNTER — Ambulatory Visit: Payer: 59

## 2023-04-01 ENCOUNTER — Other Ambulatory Visit: Payer: Self-pay | Admitting: Sports Medicine

## 2023-04-01 DIAGNOSIS — F3132 Bipolar disorder, current episode depressed, moderate: Secondary | ICD-10-CM

## 2023-04-01 DIAGNOSIS — I10 Essential (primary) hypertension: Secondary | ICD-10-CM

## 2023-04-07 ENCOUNTER — Ambulatory Visit: Payer: 59 | Admitting: Sports Medicine

## 2023-04-07 ENCOUNTER — Encounter: Payer: Self-pay | Admitting: Sports Medicine

## 2023-04-07 VITALS — BP 127/80 | HR 65 | Wt 196.0 lb

## 2023-04-07 DIAGNOSIS — Z0000 Encounter for general adult medical examination without abnormal findings: Secondary | ICD-10-CM

## 2023-04-07 DIAGNOSIS — F3132 Bipolar disorder, current episode depressed, moderate: Secondary | ICD-10-CM

## 2023-04-07 DIAGNOSIS — E6609 Other obesity due to excess calories: Secondary | ICD-10-CM | POA: Diagnosis not present

## 2023-04-07 DIAGNOSIS — I10 Essential (primary) hypertension: Secondary | ICD-10-CM

## 2023-04-07 MED ORDER — ESCITALOPRAM OXALATE 20 MG PO TABS: 20.0000 mg | ORAL_TABLET | Freq: Every day | ORAL | 3 refills | Status: DC

## 2023-04-07 MED ORDER — LOSARTAN POTASSIUM 100 MG PO TABS: 100.0000 mg | ORAL_TABLET | Freq: Every day | ORAL | 3 refills | Status: DC

## 2023-04-07 NOTE — Assessment & Plan Note (Signed)
Khamil would like to lose some weight, she plans to work on this herself, we will set a goal of 10 pounds over the last next 3 months, if she does not at the goal we will use our healthy weight and wellness clinic and consider medication.

## 2023-04-07 NOTE — Assessment & Plan Note (Signed)
Refilling medications, she is due for labs.

## 2023-04-07 NOTE — Progress Notes (Signed)
    Procedures performed today:    None.  Independent interpretation of notes and tests performed by another provider:   None.  Brief History, Exam, Impression, and Recommendations:    Annual physical exam Up-to-date on preventive measures  Essential hypertension, benign Refilling medications, she is due for labs.  Obesity Denise Gay would like to lose some weight, she plans to work on this herself, we will set a goal of 10 pounds over the last next 3 months, if she does not at the goal we will use our healthy weight and wellness clinic and consider medication.    ____________________________________________ Ihor Austin. Benjamin Stain, M.D., ABFM., CAQSM., AME. Primary Care and Sports Medicine [facility]  Adjunct Professor of Family Medicine  [employer]  Restaurant manager, fast food

## 2023-04-07 NOTE — Assessment & Plan Note (Signed)
Up-to-date on preventive measures

## 2023-04-16 ENCOUNTER — Ambulatory Visit (INDEPENDENT_AMBULATORY_CARE_PROVIDER_SITE_OTHER): Payer: 59

## 2023-04-16 DIAGNOSIS — Z1231 Encounter for screening mammogram for malignant neoplasm of breast: Secondary | ICD-10-CM | POA: Diagnosis not present

## 2023-06-01 ENCOUNTER — Other Ambulatory Visit: Payer: Self-pay | Admitting: Sports Medicine

## 2023-07-08 ENCOUNTER — Ambulatory Visit: Payer: 59 | Admitting: Sports Medicine

## 2023-08-11 ENCOUNTER — Encounter: Payer: Self-pay | Admitting: Sports Medicine

## 2023-08-11 ENCOUNTER — Ambulatory Visit: Payer: 59 | Admitting: Sports Medicine

## 2023-08-11 DIAGNOSIS — Z0000 Encounter for general adult medical examination without abnormal findings: Secondary | ICD-10-CM | POA: Diagnosis not present

## 2023-08-11 DIAGNOSIS — Z9889 Other specified postprocedural states: Secondary | ICD-10-CM

## 2023-08-11 DIAGNOSIS — Z124 Encounter for screening for malignant neoplasm of cervix: Secondary | ICD-10-CM | POA: Diagnosis not present

## 2023-08-11 DIAGNOSIS — Z1211 Encounter for screening for malignant neoplasm of colon: Secondary | ICD-10-CM | POA: Diagnosis not present

## 2023-08-11 MED ORDER — PREDNISONE 50 MG PO TABS
ORAL_TABLET | ORAL | 0 refills | Status: DC
Start: 2023-08-11 — End: 2023-09-01

## 2023-08-11 MED ORDER — PREGABALIN 50 MG PO CAPS: 50.0000 mg | ORAL_CAPSULE | Freq: Three times a day (TID) | ORAL | 3 refills | Status: DC

## 2023-08-11 NOTE — Progress Notes (Signed)
    Procedures performed today:    None.  Independent interpretation of notes and tests performed by another provider:   None.  Brief History, Exam, Impression, and Recommendations:    H/O C3-C7 Fusion Very pleasant 52 year old female, known history C3-C7 fusion with adjacent level disease, we last talked about this in 2021, she ultimately did well with Lyrica 150 mg 3 times daily. She has since come off the Lyrica and is now having recurrence of pain. We will restart the treatment protocol, 5 days of prednisone, we will start with low-dose Lyrica 50 mg 3 times daily with a gentle titration only up as needed. Restart home physical therapy, return in 6 weeks for this.  Annual physical exam Due for some preventative measures, she is up-to-date on flu shot, we will get her set up for Cologuard which will be due this physical year, we will also set her up with [facility] for cervical cancer screening.    ____________________________________________ Ihor Austin. Benjamin Stain, M.D., ABFM., CAQSM., AME. Primary Care and Sports Medicine [facility] [facility]  Adjunct Professor of Family Medicine  [place] [employer] [facility] [employer] [employer] [facility] [employer] [facility] [employer]  Restaurant manager, fast food

## 2023-08-11 NOTE — Assessment & Plan Note (Signed)
Very pleasant 52 year old female, known history C3-C7 fusion with adjacent level disease, we last talked about this in 2021, she ultimately did well with Lyrica 150 mg 3 times daily. She has since come off the Lyrica and is now having recurrence of pain. We will restart the treatment protocol, 5 days of prednisone, we will start with low-dose Lyrica 50 mg 3 times daily with a gentle titration only up as needed. Restart home physical therapy, return in 6 weeks for this.

## 2023-08-11 NOTE — Assessment & Plan Note (Signed)
Due for some preventative measures, she is up-to-date on flu shot, we will get her set up for Cologuard which will be due this physical year, we will also set her up with [facility] for cervical cancer screening.

## 2023-08-18 ENCOUNTER — Encounter: Payer: Self-pay | Admitting: Medical-Surgical

## 2023-08-18 ENCOUNTER — Other Ambulatory Visit (HOSPITAL_COMMUNITY)
Admission: RE | Admit: 2023-08-18 | Discharge: 2023-08-18 | Disposition: A | Discharge status: Home / Self Care | Payer: 59 | Source: Ambulatory Visit | Attending: Medical-Surgical | Admitting: Medical-Surgical

## 2023-08-18 ENCOUNTER — Ambulatory Visit: Payer: 59 | Admitting: Medical-Surgical

## 2023-08-18 VITALS — BP 100/62 | HR 85 | Resp 20 | Ht 66.0 in | Wt 199.0 lb

## 2023-08-18 DIAGNOSIS — Z124 Encounter for screening for malignant neoplasm of cervix: Secondary | ICD-10-CM | POA: Insufficient documentation

## 2023-08-18 NOTE — Progress Notes (Signed)
        Established patient visit  History, exam, impression, and plan:  1. Cervical cancer screening Very pleasant 52 year old female presenting today for completion of a Pap smear.  She does not remember when her last Pap smear was but reports that she has never had an abnormal result.  She is not sexually active and has not been for quite a while.  Declines STI testing today.  No vaginal concerns.  Pap smear completed without difficulty, patient tolerated well.  Sending for evaluation with HPV cotesting.  If normal, repeat in 5 years. - Cytology - PAP  Physical Exam Vitals reviewed. Exam conducted with a chaperone present.  Constitutional:      General: She is not in acute distress.    Appearance: Normal appearance. She is not ill-appearing.  HENT:     Head: Normocephalic and atraumatic.  Genitourinary:    Exam position: Lithotomy position.     Labia:        Right: No rash, tenderness, lesion or injury.        Left: No rash, tenderness, lesion or injury.      Vagina: Normal.     Cervix: Normal.     Uterus: Normal.      Adnexa: Right adnexa normal and left adnexa normal.  Neurological:     Mental Status: She is alert.   Procedures performed this visit: None.  Return if symptoms worsen or fail to improve.  __________________________________ Thayer Ohm, DNP, APRN, FNP-BC Primary Care and Sports Medicine [facility] [place]

## 2023-08-19 LAB — CYTOLOGY - PAP
Adequacy: ABSENT
Comment: NEGATIVE
Diagnosis: NEGATIVE
High risk HPV: NEGATIVE

## 2023-09-01 ENCOUNTER — Encounter (INDEPENDENT_AMBULATORY_CARE_PROVIDER_SITE_OTHER): Payer: 59 | Admitting: Sports Medicine

## 2023-09-01 DIAGNOSIS — Z9889 Other specified postprocedural states: Secondary | ICD-10-CM | POA: Diagnosis not present

## 2023-09-01 MED ORDER — METHOCARBAMOL 500 MG PO TABS
500.0000 mg | ORAL_TABLET | Freq: Three times a day (TID) | ORAL | 0 refills | Status: DC
Start: 2023-09-01 — End: 2023-09-23

## 2023-09-01 NOTE — Telephone Encounter (Signed)

## 2023-09-03 MED ORDER — PREGABALIN 100 MG PO CAPS
100.0000 mg | ORAL_CAPSULE | Freq: Three times a day (TID) | ORAL | 3 refills | Status: DC
Start: 2023-09-03 — End: 2024-06-15

## 2023-09-03 NOTE — Addendum Note (Signed)
Addended by: Monica Becton on: 09/03/2023 11:04 AM   Modules accepted: Orders

## 2023-09-08 LAB — COLOGUARD: COLOGUARD: NEGATIVE

## 2023-09-22 ENCOUNTER — Encounter: Payer: Self-pay | Admitting: Sports Medicine

## 2023-09-22 ENCOUNTER — Ambulatory Visit: Payer: 59 | Admitting: Sports Medicine

## 2023-09-22 DIAGNOSIS — Z9889 Other specified postprocedural states: Secondary | ICD-10-CM | POA: Diagnosis not present

## 2023-09-22 DIAGNOSIS — M542 Cervicalgia: Secondary | ICD-10-CM | POA: Diagnosis not present

## 2023-09-22 MED ORDER — TRAMADOL HCL 50 MG PO TABS
50.0000 mg | ORAL_TABLET | Freq: Three times a day (TID) | ORAL | 0 refills | Status: AC | PRN
Start: 2023-09-22 — End: ?

## 2023-09-22 NOTE — Assessment & Plan Note (Signed)
Pleasant 52 year old female, known C3-C7 fusion with adjacent level disease, historically did well with Lyrica 150 mg 3 times daily, she self discontinued, we restarted it with prednisone at the last visit, currently doing 100 mg 1 time daily, unable to tolerate 3 times daily treatment. She is now on her max tolerable dose of Lyrica. Unfortunately still having pain as well as right sided radiculopathy. Due to failure of greater than 6 weeks of home physical therapy, medications we will proceed with CT myelography, I do not think we will get sufficient definition with MRI due to her hardware. After I see the CT myelogram we will consider epidural, likely right sided C7-T1 interlaminar. I am also concerned that we are reaching the maximum of what I can offer her so we will go ahead and get her set up to work with a pain clinic. We can also do some tramadol in the meantime.

## 2023-09-22 NOTE — Progress Notes (Signed)
    Procedures performed today:    None.  Independent interpretation of notes and tests performed by another provider:   None.  Brief History, Exam, Impression, and Recommendations:    H/O C3-C7 Fusion Pleasant 52 year old female, known C3-C7 fusion with adjacent level disease, historically did well with Lyrica 150 mg 3 times daily, she self discontinued, we restarted it with prednisone at the last visit, currently doing 100 mg 1 time daily, unable to tolerate 3 times daily treatment. She is now on her max tolerable dose of Lyrica. Unfortunately still having pain as well as right sided radiculopathy. Due to failure of greater than 6 weeks of home physical therapy, medications we will proceed with CT myelography, I do not think we will get sufficient definition with MRI due to her hardware. After I see the CT myelogram we will consider epidural, likely right sided C7-T1 interlaminar. I am also concerned that we are reaching the maximum of what I can offer her so we will go ahead and get her set up to work with a pain clinic. We can also do some tramadol in the meantime.    ____________________________________________ Ihor Austin. Benjamin Stain, M.D., ABFM., CAQSM., AME. Primary Care and Sports Medicine [facility]  Adjunct Professor of Family Medicine  [employer] [place] [employer]  Restaurant manager, fast food

## 2023-09-23 MED ORDER — METHOCARBAMOL 500 MG PO TABS: 500.0000 mg | ORAL_TABLET | Freq: Three times a day (TID) | ORAL | 0 refills | Status: DC

## 2023-09-24 ENCOUNTER — Other Ambulatory Visit: Payer: Self-pay | Admitting: Sports Medicine

## 2023-09-24 DIAGNOSIS — Z9889 Other specified postprocedural states: Secondary | ICD-10-CM

## 2023-09-24 DIAGNOSIS — M542 Cervicalgia: Secondary | ICD-10-CM

## 2023-09-28 ENCOUNTER — Encounter: Payer: Self-pay | Admitting: Sports Medicine

## 2023-09-29 NOTE — Discharge Instructions (Signed)

## 2023-09-30 ENCOUNTER — Ambulatory Visit
Admission: RE | Admit: 2023-09-30 | Discharge: 2023-09-30 | Disposition: A | Discharge status: Home / Self Care | Payer: 59 | Source: Ambulatory Visit | Attending: Sports Medicine | Admitting: Sports Medicine

## 2023-09-30 DIAGNOSIS — Z9889 Other specified postprocedural states: Secondary | ICD-10-CM

## 2023-09-30 DIAGNOSIS — M542 Cervicalgia: Secondary | ICD-10-CM

## 2023-09-30 MED ORDER — MEPERIDINE HCL 50 MG/ML IJ SOLN
50.0000 mg | Freq: Once | INTRAMUSCULAR | Status: DC | PRN
Start: 2023-09-30 — End: 2023-10-01

## 2023-09-30 MED ORDER — DIAZEPAM 5 MG PO TABS
10.0000 mg | ORAL_TABLET | Freq: Once | ORAL | Status: AC
  Administered 2023-09-30: 5 mg via ORAL

## 2023-09-30 MED ORDER — ONDANSETRON HCL 4 MG/2ML IJ SOLN
4.0000 mg | Freq: Once | INTRAMUSCULAR | Status: DC | PRN
Start: 2023-09-30 — End: 2023-10-01

## 2023-09-30 MED ORDER — IOPAMIDOL (ISOVUE-M 300) INJECTION 61%
10.0000 mL | Freq: Once | INTRAMUSCULAR | Status: AC
  Administered 2023-09-30: 10 mL via INTRATHECAL

## 2023-10-01 ENCOUNTER — Encounter (INDEPENDENT_AMBULATORY_CARE_PROVIDER_SITE_OTHER): Payer: 59 | Admitting: Sports Medicine

## 2023-10-01 DIAGNOSIS — M778 Other enthesopathies, not elsewhere classified: Secondary | ICD-10-CM | POA: Diagnosis not present

## 2023-10-01 DIAGNOSIS — Z9889 Other specified postprocedural states: Secondary | ICD-10-CM

## 2023-10-01 NOTE — Telephone Encounter (Signed)

## 2023-10-02 NOTE — Telephone Encounter (Signed)
Spoke with patient, Imaging have contacted her, thanks .   Copied from CRM [id number]. Topic: Appointments - Appointment Scheduling >> Oct 01, 2023  4:40 PM Desma Mcgregor wrote: Reason for CRM: Pt call was dropped. She called back. Emelia Loron advised that if pt wants cervical epidural, then an appt needs to be set. Left on hold waiting for assistance on how to schedule. Please contact patient [phone]

## 2023-10-15 ENCOUNTER — Encounter: Payer: Self-pay | Admitting: Sports Medicine

## 2023-10-19 NOTE — Discharge Instructions (Signed)

## 2023-10-20 ENCOUNTER — Inpatient Hospital Stay
Admission: RE | Admit: 2023-10-20 | Discharge: 2023-10-20 | Disposition: A | Discharge status: Home / Self Care | Payer: 59 | Source: Ambulatory Visit | Attending: Sports Medicine | Admitting: Sports Medicine

## 2023-10-20 DIAGNOSIS — Z9889 Other specified postprocedural states: Secondary | ICD-10-CM

## 2023-10-20 MED ORDER — TRIAMCINOLONE ACETONIDE 40 MG/ML IJ SUSP (RADIOLOGY)
60.0000 mg | Freq: Once | INTRAMUSCULAR | Status: AC
  Administered 2023-10-20: 60 mg via EPIDURAL

## 2023-10-20 MED ORDER — IOPAMIDOL (ISOVUE-M 300) INJECTION 61%
1.0000 mL | Freq: Once | INTRAMUSCULAR | Status: AC | PRN
  Administered 2023-10-20: 1 mL via EPIDURAL

## 2024-01-27 ENCOUNTER — Other Ambulatory Visit: Payer: Self-pay | Admitting: Sports Medicine

## 2024-01-27 DIAGNOSIS — I10 Essential (primary) hypertension: Secondary | ICD-10-CM

## 2024-01-27 DIAGNOSIS — F3132 Bipolar disorder, current episode depressed, moderate: Secondary | ICD-10-CM

## 2024-02-04 ENCOUNTER — Other Ambulatory Visit: Payer: Self-pay | Admitting: Sports Medicine

## 2024-02-04 DIAGNOSIS — Z9889 Other specified postprocedural states: Secondary | ICD-10-CM

## 2024-02-04 NOTE — Telephone Encounter (Signed)
 Please advise if the refill is appropriate. Last refill was sent on 03/31/22. Thanks in advance.

## 2024-02-08 ENCOUNTER — Other Ambulatory Visit: Payer: Self-pay | Admitting: Sports Medicine

## 2024-02-08 DIAGNOSIS — Z9889 Other specified postprocedural states: Secondary | ICD-10-CM

## 2024-03-06 NOTE — Therapy (Signed)
 OUTPATIENT PHYSICAL THERAPY SHOULDER EVALUATION   Patient Name: Denise Gay MRN: [id number] DOB:05-May-1971, 53 y.o., female Today's Date: 03/08/2024  END OF SESSION:  PT End of Session - 03/08/24 0907     Visit Number 1    Number of Visits 16    Date for PT Re-Evaluation 05/03/24    Authorization Type [employer] no copay    Authorization Time Period year    Authorization - Visit Number 1    Authorization - Number of Visits 30    PT Start Time 0803    PT Stop Time 0855    PT Time Calculation (min) 52 min    Activity Tolerance Patient tolerated treatment well             Past Medical History:  Diagnosis Date   Asthma    as a child   Diabetes mellitus without complication (HCC)    was before gastric bypass but patient is not taking any medications for that   Gastric bypass status for obesity    History of staph infection    from previous lumbar back surgery   Hypertension    Mononucleosis    Sleep apnea    before gastric bypass no longer wearing CPAP   Spinal stenosis of lumbar region 01/18/2016   Past Surgical History:  Procedure Laterality Date   ANTERIOR CERVICAL DECOMP/DISCECTOMY FUSION N/A 05/13/2017   Procedure: ANTERIOR CERVICAL DECOMPRESSION FUSION, CERVICAL 3-4, CERVICAL 4-5 WITH INSTRUMENTATION AND ALLOGRAFT; HARDWARE REMOVAL AT CERVICAL 5-6, CERVICAL 6-7.;  Surgeon: Virl Grimes, MD;  Location: [facility] OR;  Service: Orthopedics;  Laterality: N/A;  ANTERIOR CERVICAL DECOMPRESSION FUSION, CERVICAL 3-4, CERVICAL 4-5 WITH INSTRUMENTATION AND ALLOGRAFT; HARDWARE REMOVAL AT CERVICAL 5-6, CERVICAL 6-   CERVICAL LAMINECTOMY     CHOLECYSTECTOMY  08/2020   DIAGNOSTIC LAPAROSCOPY     GASTRIC BYPASS  08/2011   lumber surgery     REDUCTION MAMMAPLASTY Bilateral    TRANSFORAMINAL LUMBAR INTERBODY FUSION (TLIF) WITH PEDICLE SCREW FIXATION 1 LEVEL Right 03/07/2021   Procedure: RIGHT-SIDED LUMBAR 4- LUMBAR 5 DECOMPRESSION AND TRANSFORAMINAL LUMBAR INTERBODY FUSION WITH  INSTRUMENTATION AND ALLOGRAFT;  Surgeon: Virl Grimes, MD;  Location: [facility] OR;  Service: Orthopedics;  Laterality: Right;   Patient Active Problem List   Diagnosis Date Noted   COVID-19 10/24/2022   Acute generalized abdominal pain 04/11/2022   Trochanteric bursitis, right hip 11/30/2020   Bipolar affective disorder, currently depressed, moderate (HCC) 07/27/2020   Allergic rhinitis 02/03/2020   Hot flashes due to menopause 09/05/2019   Postmenopausal hormone therapy 09/05/2019   Otitis media 09/01/2016   Obesity 03/10/2016   Spinal stenosis of lumbar region 01/18/2016   Progressive focal motor weakness 01/15/2016   H/O C3-C7 Fusion 04/30/2015   Annual physical exam 04/30/2015   History of Roux-en-Y gastric bypass 11/28/2014   Essential hypertension, benign 11/28/2014    PCP: Dr Nyra Bellis  REFERRING PROVIDER: Dr Nyra Bellis  REFERRING DIAG: cervical radiculopathy  THERAPY DIAG:  Acute pain of right shoulder  Other symptoms and signs involving the musculoskeletal system  Abnormal posture  Muscle weakness (generalized)  Rationale for Evaluation and Treatment: Rehabilitation  ONSET DATE: 11/21/23  SUBJECTIVE:  SUBJECTIVE STATEMENT: Patient reports that she can't lift her R arm. She has no known injury. Pain started ~ 2-3 months ago. She did get a cortisone shot a couple of weeks ago so it is some better.  Hand dominance: Left  PERTINENT HISTORY:  Cervical fusion C3-7 03/11/16, 05/13/17; cervical stenosis C4/5; R sided lumbar L4/5 decompression and transforminal lumbar interbody fusion with allograft; gastric bypass; anxiety    PAIN:  Are you having pain? Yes: NPRS scale: 5/10; best in past week 3/10; worst 7/10 Pain location: R shoulder  Pain description:  throbbing  Aggravating factors: stretching out to side; lifting arm overhead  Relieving factors: not moving arm; injection; meds   PRECAUTIONS: None  RED FLAGS: None     WEIGHT BEARING RESTRICTIONS: No  FALLS:  Has patient fallen in last 6 months? 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  DIAGNOSTIC FINDINGS:  CT Scan: IMPRESSION:  1. Anterior cervical fusion from C3 to C7 which is new at the C3-C4 and C4-C5 level since the previous exam. 2. Resection of disc protrusions of the upper cervical spine since the previous exam with continued uncovertebral osteophytes and spurs causing moderate to severe right and moderate left neural foraminal narrowing at C4-C5 with decreased canal  narrowing. 3. No other significant change with mild neural foraminal narrowing and residual endplate osteophytes/spurs most prominently left centrally at C6-C7 and approaching the anterior cord.   PATIENT SURVEYS:  DASH: 55/100  COGNITION: Overall cognitive status: Within functional limits for tasks assessed  SENSATION: Tingling R hand - prior to frozen shoulder; from cervical surgery   POSTURE: rounded shoulders, forward head, and increased thoracic kyphosis  PALPATION: Muscular tightness R > L pecs; upper trap; leveator; cervical musculature; thoracic paraspinals Pain with PA mobs mid thoracic spine    CERVICAL ROM:   Active ROM A/PROM (deg) eval  Flexion 46  Extension 32  Right lateral flexion 32  Left lateral flexion 26  Right rotation 48 tight  Left rotation 52 tight   (Blank rows = not  tested)  UPPER EXTREMITY ROM:  Active ROM Right eval Left eval  Shoulder flexion 94 137  Shoulder extension 40 41  Shoulder abduction 95 pain 145  Shoulder adduction    Shoulder extension    Shoulder internal rotation Thumb to waist Thumb T10  Shoulder external rotation 29 53  Elbow flexion    Elbow extension    Wrist flexion    Wrist extension    Wrist ulnar deviation    Wrist radial deviation    Wrist pronation    Wrist supination     (Blank rows = not tested)  UPPER EXTREMITY MMT:  MMT Right eval Left eval  Shoulder flexion 4+ 4+  Shoulder extension 5 5  Shoulder abduction 4 4  Shoulder adduction    Shoulder extension    Shoulder internal rotation 4+ 5  Shoulder external rotation 4 5  Middle trapezius 4 4  Lower trapezius 4 4  Elbow flexion    Elbow extension    Wrist flexion    Wrist extension    Wrist  ulnar deviation    Wrist radial deviation    Wrist pronation    Wrist supination    Grip strength     (Blank rows = not tested)   TREATMENT DATE: 03/08/24  See HEP                                                                                                                                  PATIENT EDUCATION:  Education details: POC; HEP  Person educated: Patient Education method: Explanation, Demonstration, Tactile cues, Verbal cues, and Handouts Education comprehension: verbalized understanding, returned demonstration, verbal cues required, tactile cues required, and needs further education  HOME EXERCISE PROGRAM: Access Code: [email or web address] URL: [email or web address] Date: 03/08/2024 Prepared by: Ta Fair  Exercises - Seated Shoulder Flexion AAROM with Pulley Behind  - 2 x daily - 7 x weekly - 1 sets - 10 reps - 10 sec  hold - Seated Shoulder Scaption AAROM with Pulley at Side  - 2 x daily - 7 x weekly - 1 sets - 10 reps - 10sec  hold - Seated Scapular Retraction  - 2 x daily - 7 x weekly - 1-2 sets - 10 reps - 10 sec  hold -  Seated Shoulder External Rotation AAROM with Cane and Hand in Neutral  - 2 x daily - 7 x weekly - 1 sets - 5-10 reps - 5-10 sec  hold - Standing Shoulder Extension with Dowel  - 2 x daily - 7 x weekly - 1 sets - 5-10 reps - 5-10 sec  hold - Standing Shoulder Internal Rotation AAROM with Dowel  - 2 x daily - 7 x weekly - 1 sets - 5 reps - 10 sec  hold - Standing Shoulder and Trunk Flexion at Table  - 2 x daily - 7 x weekly - 1 sets - 5 reps - 5 sec  hold - Standing Infraspinatus/Teres Minor Release with Ball at [employer]  - 2 x daily - 7 x weekly - Standing Pectoral Release with Ball at Wall  - 3-4 x daily - 7 x weekly  Patient Education - Office Posture  ASSESSMENT:  CLINICAL IMPRESSION: Patient is a 53 y.o. female who was seen today for physical therapy evaluation and treatment for R adhesive capsulitis which has been present for the past 2-3 months with no known injury. She reports some improvement following injection a couple of weeks ago. Patient presents with R shoulder pain and limited ROM. She has poor posture and alignment; limited ROM/mobility R > L shoulder; weakness; pain with functional activities. Patient will benefit from PT to address problems identified.    OBJECTIVE IMPAIRMENTS: decreased activity tolerance, decreased mobility, decreased ROM, decreased strength, impaired UE functional use, improper body mechanics, postural dysfunction, and pain.   ACTIVITY LIMITATIONS: carrying, lifting, sleeping, bathing, dressing, reach over head, and hygiene/grooming  PARTICIPATION LIMITATIONS: meal prep, laundry, and yard work  PERSONAL FACTORS: Behavior pattern, Fitness, Past/current experiences, Profession,  and Time since onset of injury/illness/exacerbation are also affecting patient's functional outcome.   REHAB POTENTIAL: Good  CLINICAL DECISION MAKING: Evolving/moderate complexity  EVALUATION COMPLEXITY: Moderate   GOALS: Goals reviewed with patient? Yes  SHORT TERM GOALS:  Target date: 04/05/2024  Independent in initial HEP  Baseline:  Goal status: INITIAL  2.  Increase AROM R shoulder by 5-10 degrees allowing patient to reach up to shampoo hair and put hair in a ponytail  Baseline:  Goal status: INITIAL  3.  Patient reports and demonatrates proper posture and alignment for sitting at desk/computer and in recliner with modifications  Baseline:  Goal status: INITIAL   LONG TERM GOALS: Target date: 05/03/2024  Improve posture and alignment with patient to demonstrate increased activation of middle and lower trap with increased strength to 5/5  Baseline:  Goal status: INITIAL  2.  Decrease pain R shoulder to 0/10 at rest and no greater than 2-3/10 with elevation of R UE  Baseline:  Goal status: INITIAL  3.  Increase AROM R shoulder to equal to AROM L shoulder in order for patient to use R UE for all functional activities  Baseline:  Goal status: INITIAL  4.  Patient reports ability to use R UE for functional activities overhead with no limitations such as reaching into a cabinet or closet shelf  Baseline:  Goal status: INITIAL  5.  Independent in HEP including aquatic program as indicated  Baseline:  Goal status: INITIAL  6.  Improve DASH by 10-15 points  Baseline: DASH: 55/100 Goal status: INITIAL   PLAN:  PT FREQUENCY: 2x/week  PT DURATION: 8 weeks  PLANNED INTERVENTIONS: 97164- PT Re-evaluation, 97110-Therapeutic exercises, 97530- Therapeutic activity, 97112- Neuromuscular re-education, 97535- Self Care, 16109- Manual therapy, [phone]- Aquatic Therapy, [phone]- Ionotophoresis 4mg /ml Dexamethasone , Taping, Dry Needling, Joint mobilization, Cryotherapy, and Moist heat  PLAN FOR NEXT SESSION: review and progress exercises; continue spine care and ergonomic education; manual work and modalities    Tamla Winkels P Khi Mcmillen, PT 03/08/2024, 9:09 AM

## 2024-03-08 ENCOUNTER — Encounter: Payer: Self-pay | Admitting: Rehabilitative and Restorative Service Providers"

## 2024-03-08 ENCOUNTER — Other Ambulatory Visit: Payer: Self-pay

## 2024-03-08 ENCOUNTER — Ambulatory Visit: Attending: Orthopedic Surgery | Admitting: Rehabilitative and Restorative Service Providers"

## 2024-03-08 DIAGNOSIS — R293 Abnormal posture: Secondary | ICD-10-CM | POA: Diagnosis present

## 2024-03-08 DIAGNOSIS — M25511 Pain in right shoulder: Secondary | ICD-10-CM | POA: Diagnosis present

## 2024-03-08 DIAGNOSIS — M6281 Muscle weakness (generalized): Secondary | ICD-10-CM | POA: Insufficient documentation

## 2024-03-08 DIAGNOSIS — R29898 Other symptoms and signs involving the musculoskeletal system: Secondary | ICD-10-CM | POA: Insufficient documentation

## 2024-03-16 ENCOUNTER — Encounter: Payer: Self-pay | Admitting: Rehabilitative and Restorative Service Providers"

## 2024-03-16 ENCOUNTER — Ambulatory Visit: Admitting: Rehabilitative and Restorative Service Providers"

## 2024-03-16 DIAGNOSIS — M25511 Pain in right shoulder: Secondary | ICD-10-CM

## 2024-03-16 DIAGNOSIS — R29898 Other symptoms and signs involving the musculoskeletal system: Secondary | ICD-10-CM

## 2024-03-16 DIAGNOSIS — R293 Abnormal posture: Secondary | ICD-10-CM

## 2024-03-16 DIAGNOSIS — M6281 Muscle weakness (generalized): Secondary | ICD-10-CM

## 2024-03-16 NOTE — Therapy (Signed)
 OUTPATIENT PHYSICAL THERAPY SHOULDER EVALUATION   Patient Name: Denise Gay MRN: [id number] DOB:March 21, 1971, 53 y.o., female Today's Date: 03/16/2024  END OF SESSION:  PT End of Session - 03/16/24 0759     Visit Number 2    Number of Visits 16    Date for PT Re-Evaluation 05/03/24    Authorization Type [employer] no copay    Authorization Time Period year    Authorization - Visit Number 2    Authorization - Number of Visits 30    PT Start Time 0759    PT Stop Time 0844    PT Time Calculation (min) 45 min    Activity Tolerance Patient tolerated treatment well             Past Medical History:  Diagnosis Date   Asthma    as a child   Diabetes mellitus without complication (HCC)    was before gastric bypass but patient is not taking any medications for that   Gastric bypass status for obesity    History of staph infection    from previous lumbar back surgery   Hypertension    Mononucleosis    Sleep apnea    before gastric bypass no longer wearing CPAP   Spinal stenosis of lumbar region 01/18/2016   Past Surgical History:  Procedure Laterality Date   ANTERIOR CERVICAL DECOMP/DISCECTOMY FUSION N/A 05/13/2017   Procedure: ANTERIOR CERVICAL DECOMPRESSION FUSION, CERVICAL 3-4, CERVICAL 4-5 WITH INSTRUMENTATION AND ALLOGRAFT; HARDWARE REMOVAL AT CERVICAL 5-6, CERVICAL 6-7.;  Surgeon: Virl Grimes, MD;  Location: [facility] OR;  Service: Orthopedics;  Laterality: N/A;  ANTERIOR CERVICAL DECOMPRESSION FUSION, CERVICAL 3-4, CERVICAL 4-5 WITH INSTRUMENTATION AND ALLOGRAFT; HARDWARE REMOVAL AT CERVICAL 5-6, CERVICAL 6-   CERVICAL LAMINECTOMY     CHOLECYSTECTOMY  08/2020   DIAGNOSTIC LAPAROSCOPY     GASTRIC BYPASS  08/2011   lumber surgery     REDUCTION MAMMAPLASTY Bilateral    TRANSFORAMINAL LUMBAR INTERBODY FUSION (TLIF) WITH PEDICLE SCREW FIXATION 1 LEVEL Right 03/07/2021   Procedure: RIGHT-SIDED LUMBAR 4- LUMBAR 5 DECOMPRESSION AND TRANSFORAMINAL LUMBAR INTERBODY FUSION WITH  INSTRUMENTATION AND ALLOGRAFT;  Surgeon: Virl Grimes, MD;  Location: [facility] OR;  Service: Orthopedics;  Laterality: Right;   Patient Active Problem List   Diagnosis Date Noted   COVID-19 10/24/2022   Acute generalized abdominal pain 04/11/2022   Trochanteric bursitis, right hip 11/30/2020   Bipolar affective disorder, currently depressed, moderate (HCC) 07/27/2020   Allergic rhinitis 02/03/2020   Hot flashes due to menopause 09/05/2019   Postmenopausal hormone therapy 09/05/2019   Otitis media 09/01/2016   Obesity 03/10/2016   Spinal stenosis of lumbar region 01/18/2016   Progressive focal motor weakness 01/15/2016   H/O C3-C7 Fusion 04/30/2015   Annual physical exam 04/30/2015   History of Roux-en-Y gastric bypass 11/28/2014   Essential hypertension, benign 11/28/2014    PCP: Dr Nyra Bellis  REFERRING PROVIDER: Dr Nyra Bellis  REFERRING DIAG: cervical radiculopathy  THERAPY DIAG:  Acute pain of right shoulder  Other symptoms and signs involving the musculoskeletal system  Abnormal posture  Muscle weakness (generalized)  Rationale for Evaluation and Treatment: Rehabilitation  ONSET DATE: 11/21/23  SUBJECTIVE:  SUBJECTIVE STATEMENT: Patient reports that the shoulder is "about the same". She has worked on some of her exercises but has not ordered a pulley yet. She plans to order a pulley for home use.   EVAL: Patient reports that she can't lift her R arm. She has no known injury. Pain started ~ 2-3 months ago. She did get a cortisone shot a couple of weeks ago so it is some better.  Hand dominance: Left  PERTINENT HISTORY:  Cervical fusion C3-7 03/11/16, 05/13/17; cervical stenosis C4/5; R sided lumbar L4/5 decompression and transforminal lumbar interbody  fusion with allograft; gastric bypass; anxiety    PAIN:  Are you having pain? Yes: NPRS scale: 4/10; best in past week 3/10; worst 7/10 Pain location: R shoulder  Pain description: throbbing  Aggravating factors: stretching out to side; lifting arm overhead  Relieving factors: not moving arm; injection; meds   PRECAUTIONS: None  WEIGHT BEARING RESTRICTIONS: No  FALLS:  Has patient fallen in last 6 months? 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  DIAGNOSTIC FINDINGS:  CT Scan: IMPRESSION:  1. Anterior cervical fusion from C3 to C7 which is new at the C3-C4 and C4-C5 level since the previous exam. 2. Resection of disc protrusions of the upper cervical spine since the previous exam with continued uncovertebral osteophytes and spurs causing moderate to severe right and moderate left neural foraminal narrowing at C4-C5 with decreased canal  narrowing. 3. No other significant change with mild neural foraminal narrowing and residual endplate osteophytes/spurs most prominently left centrally at C6-C7 and approaching the anterior cord.   PATIENT SURVEYS:  DASH: 55/100  SENSATION: Tingling R hand - prior to frozen shoulder; from cervical surgery   POSTURE: rounded shoulders, forward head, and increased thoracic kyphosis  PALPATION: Muscular tightness R > L pecs; upper trap; leveator; cervical musculature; thoracic paraspinals Pain with PA mobs mid thoracic spine    CERVICAL ROM:   Active ROM A/PROM (deg) eval  Flexion 46  Extension 32  Right lateral flexion 32  Left lateral flexion 26  Right  rotation 48 tight  Left rotation 52 tight   (Blank rows = not tested)  UPPER EXTREMITY ROM:  Active ROM Right eval Left eval  Shoulder flexion 94 137  Shoulder extension 40 41  Shoulder abduction 95 pain 145  Shoulder adduction    Shoulder extension    Shoulder internal rotation Thumb to waist Thumb T10  Shoulder external rotation 29 53  Elbow flexion    Elbow extension    Wrist flexion    Wrist extension    Wrist ulnar deviation    Wrist radial deviation    Wrist pronation    Wrist supination     (Blank rows = not tested)  UPPER EXTREMITY MMT:  MMT Right eval Left eval  Shoulder flexion 4+ 4+  Shoulder extension 5 5  Shoulder abduction 4 4  Shoulder adduction    Shoulder extension    Shoulder internal rotation 4+ 5  Shoulder external rotation 4 5  Middle trapezius 4 4  Lower trapezius 4 4  Elbow flexion    Elbow  extension    Wrist flexion    Wrist extension    Wrist ulnar deviation    Wrist radial deviation    Wrist pronation    Wrist supination    Grip strength     (Blank rows = not tested)   [facility] Adult PT Treatment:                                                DATE: 03/16/24 Therapeutic Exercise: Sitting Pulley flexion 10 sec x 10 Pulley scaption 10 sec x 10  Shoulder horizontal ab/adduction at ~ 80 deg abduction x 15  Standing  Supine  Shoulder flexion with dowel 5 sec x 10  Manual Therapy: STM R shoulder girdle PROM R shoulder flexion, scaption, IR/ER in scaption; extension; horizontal abduction Long arm distraction  Neuromuscular re-ed: Scapular retraction in standing - noodle along spine  Supine T - prolonged stretch ~ 1-2 minutes  Therapeutic Activity: Standing ER noodle along spine elbow flexion at 90 deg and at side 10 sec x 10  Shoulder extension with cane 10 sec x 10  Shoulder IR pulling cane across buttocks 10 sec x 10  Isometric step back green TB 5 sec x 10  Isometric step out ER green TB 5 sec x 10  Isometric step out IR  green TB 5 sec x 10  Supine  Self Care: Encouraged purchase of pulley for home                                                                                                                                 PATIENT EDUCATION:  Education details: POC; HEP  Person educated: Patient Education method: Programmer, multimedia, Facilities manager, Actor cues, Verbal cues, and Handouts Education comprehension: verbalized understanding, returned demonstration, verbal cues required, tactile cues required, and needs further education  HOME EXERCISE PROGRAM: Access Code: [email or web address] URL: [email or web address] Date: 03/16/2024 Prepared by: Samona Chihuahua  Exercises - Seated Shoulder Flexion AAROM with Pulley Behind  - 2 x daily - 7 x weekly - 1 sets - 10 reps - 10 sec  hold - Seated Shoulder Scaption AAROM with Pulley at Side  - 2 x daily - 7 x weekly - 1 sets - 10 reps - 10sec  hold - Seated Scapular Retraction  - 2 x daily - 7 x weekly - 1-2 sets - 10 reps - 10 sec  hold - Seated Shoulder External Rotation AAROM with Cane and Hand in Neutral  - 2 x daily - 7 x weekly - 1 sets - 5-10 reps - 5-10 sec  hold - Standing Shoulder Extension with Dowel  - 2 x daily - 7 x weekly - 1 sets - 5-10 reps - 5-10 sec  hold - Standing Shoulder Internal Rotation AAROM with Dowel  - 2 x daily -  7 x weekly - 1 sets - 5 reps - 10 sec  hold - Standing Shoulder and Trunk Flexion at Table  - 2 x daily - 7 x weekly - 1 sets - 5 reps - 5 sec  hold - Standing Infraspinatus/Teres Minor Release with Ball at [employer]  - 2 x daily - 7 x weekly - Standing Pectoral Release with Ball at [employer]  - 3-4 x daily - 7 x weekly - Standing Shoulder Row Reactive Isometric  - 1 x daily - 7 x weekly - 1 sets - 10 reps - 5 sec  hold - External Rotation Reactive Isometrics with Flex Bar  - 1 x daily - 7 x weekly - 1-2 sets - 10 reps - 5 sec  hold - Shoulder Internal Rotation Reactive Isometrics  - 1 x daily - 7 x weekly - 1 sets - 10 reps - 5 sec   hold - Supine Shoulder Flexion with Dowel  - 1 x daily - 7 x weekly - 1 sets - 5-10 reps - 3 sec  hold  Patient Education - Scientist, physiological  Patient Education - Office Posture  ASSESSMENT:  CLINICAL IMPRESSION: Patient returns reporting no significant change in R shoulder pain. She has worked on some of the exercises at home and plans to order a pulley for home. Reviewed and progressed exercises with focus on ROM and initiated isometric strengthening. Added manual work and PROM to [employer]. Patient tolerated treatment well.   EVAL: Patient is a 53 y.o. female who was seen today for physical therapy evaluation and treatment for R adhesive capsulitis which has been present for the past 2-3 months with no known injury. She reports some improvement following injection a couple of weeks ago. Patient presents with R shoulder pain and limited ROM. She has poor posture and alignment; limited ROM/mobility R > L shoulder; weakness; pain with functional activities. Patient will benefit from PT to address problems identified.    OBJECTIVE IMPAIRMENTS: decreased activity tolerance, decreased mobility, decreased ROM, decreased strength, impaired UE functional use, improper body mechanics, postural dysfunction, and pain.    GOALS: Goals reviewed with patient? Yes  SHORT TERM GOALS: Target date: 04/05/2024  Independent in initial HEP  Baseline:  Goal status: INITIAL  2.  Increase AROM R shoulder by 5-10 degrees allowing patient to reach up to shampoo hair and put hair in a ponytail  Baseline:  Goal status: INITIAL  3.  Patient reports and demonatrates proper posture and alignment for sitting at desk/computer and in recliner with modifications  Baseline:  Goal status: INITIAL   LONG TERM GOALS: Target date: 05/03/2024  Improve posture and alignment with patient to demonstrate increased activation of middle and lower trap with increased strength to 5/5  Baseline:  Goal status: INITIAL  2.   Decrease pain R shoulder to 0/10 at rest and no greater than 2-3/10 with elevation of R UE  Baseline:  Goal status: INITIAL  3.  Increase AROM R shoulder to equal to AROM L shoulder in order for patient to use R UE for all functional activities  Baseline:  Goal status: INITIAL  4.  Patient reports ability to use R UE for functional activities overhead with no limitations such as reaching into a cabinet or closet shelf  Baseline:  Goal status: INITIAL  5.  Independent in HEP including aquatic program as indicated  Baseline:  Goal status: INITIAL  6.  Improve DASH by 10-15 points  Baseline: DASH: 55/100 Goal status: INITIAL  PLAN:  PT FREQUENCY: 2x/week  PT DURATION: 8 weeks  PLANNED INTERVENTIONS: 97164- PT Re-evaluation, 97110-Therapeutic exercises, 97530- Therapeutic activity, 97112- Neuromuscular re-education, 97535- Self Care, 57846- Manual therapy, [phone]- Aquatic Therapy, [phone]- Ionotophoresis 4mg /ml Dexamethasone , Taping, Dry Needling, Joint mobilization, Cryotherapy, and Moist heat  PLAN FOR NEXT SESSION: review and progress exercises; continue spine care and ergonomic education; manual work and modalities    Caedence Snowden P Tiarna Koppen, PT 03/16/2024, 8:00 AM

## 2024-03-21 ENCOUNTER — Encounter: Payer: Self-pay | Admitting: Rehabilitative and Restorative Service Providers"

## 2024-03-21 ENCOUNTER — Ambulatory Visit: Attending: Orthopedic Surgery | Admitting: Rehabilitative and Restorative Service Providers"

## 2024-03-21 DIAGNOSIS — M6281 Muscle weakness (generalized): Secondary | ICD-10-CM | POA: Diagnosis present

## 2024-03-21 DIAGNOSIS — R29898 Other symptoms and signs involving the musculoskeletal system: Secondary | ICD-10-CM | POA: Diagnosis present

## 2024-03-21 DIAGNOSIS — M25511 Pain in right shoulder: Secondary | ICD-10-CM | POA: Insufficient documentation

## 2024-03-21 DIAGNOSIS — R293 Abnormal posture: Secondary | ICD-10-CM | POA: Insufficient documentation

## 2024-03-21 NOTE — Therapy (Addendum)
 OUTPATIENT PHYSICAL THERAPY SHOULDER EVALUATION PHYSICAL THERAPY DISCHARGE SUMMARY  Visits from Start of Care: 3  Current functional level related to goals / functional outcomes: See progress note for discharge status    Remaining deficits: Unknown    Education / Equipment: HEP    Patient agrees to discharge. Patient goals were partially met. Patient is being discharged due to not returning since the last visit. Brynlei Klausner P. Ina PT, MPH 06/28/24 3:26 PM     Patient Name: Denise Gay MRN: [id number] DOB:[birth year], 53 y.o., female Today's Date: 03/21/2024  END OF SESSION:  PT End of Session - 03/21/24 0719     Visit Number 3    Number of Visits 16    Date for PT Re-Evaluation 05/03/24    Authorization Type [employer] no copay    Authorization Time Period year    Authorization - Visit Number 3    PT Start Time 0715    PT Stop Time 0800    PT Time Calculation (min) 45 min    Activity Tolerance Patient tolerated treatment well             Past Medical History:  Diagnosis Date   Asthma    as a child   Diabetes mellitus without complication (HCC)    was before gastric bypass but patient is not taking any medications for that   Gastric bypass status for obesity    History of staph infection    from previous lumbar back surgery   Hypertension    Mononucleosis    Sleep apnea    before gastric bypass no longer wearing CPAP   Spinal stenosis of lumbar region 01/18/2016   Past Surgical History:  Procedure Laterality Date   ANTERIOR CERVICAL DECOMP/DISCECTOMY FUSION N/A 05/13/2017   Procedure: ANTERIOR CERVICAL DECOMPRESSION FUSION, CERVICAL 3-4, CERVICAL 4-5 WITH INSTRUMENTATION AND ALLOGRAFT; HARDWARE REMOVAL AT CERVICAL 5-6, CERVICAL 6-7.;  Surgeon: Beuford Anes, MD;  Location: [facility] OR;  Service: Orthopedics;  Laterality: N/A;  ANTERIOR CERVICAL DECOMPRESSION FUSION, CERVICAL 3-4, CERVICAL 4-5 WITH INSTRUMENTATION AND ALLOGRAFT; HARDWARE REMOVAL AT CERVICAL 5-6, CERVICAL  6-   CERVICAL LAMINECTOMY     CHOLECYSTECTOMY  08/2020   DIAGNOSTIC LAPAROSCOPY     GASTRIC BYPASS  08/2011   lumber surgery     REDUCTION MAMMAPLASTY Bilateral    TRANSFORAMINAL LUMBAR INTERBODY FUSION (TLIF) WITH PEDICLE SCREW FIXATION 1 LEVEL Right 03/07/2021   Procedure: RIGHT-SIDED LUMBAR 4- LUMBAR 5 DECOMPRESSION AND TRANSFORAMINAL LUMBAR INTERBODY FUSION WITH INSTRUMENTATION AND ALLOGRAFT;  Surgeon: Beuford Anes, MD;  Location: [facility] OR;  Service: Orthopedics;  Laterality: Right;   Patient Active Problem List   Diagnosis Date Noted   COVID-19 10/24/2022   Acute generalized abdominal pain 04/11/2022   Trochanteric bursitis, right hip 11/30/2020   Bipolar affective disorder, currently depressed, moderate (HCC) 07/27/2020   Allergic rhinitis 02/03/2020   Hot flashes due to menopause 09/05/2019   Postmenopausal hormone therapy 09/05/2019   Otitis media 09/01/2016   Obesity 03/10/2016   Spinal stenosis of lumbar region 01/18/2016   Progressive focal motor weakness 01/15/2016   H/O C3-C7 Fusion 04/30/2015   Annual physical exam 04/30/2015   History of Roux-en-Y gastric bypass 11/28/2014   Essential hypertension, benign 11/28/2014    PCP: Dr Debby JINNY Daniel  REFERRING PROVIDER: Dr Debby JINNY Daniel  REFERRING DIAG: cervical radiculopathy  THERAPY DIAG:  Acute pain of right shoulder  Other symptoms and signs involving the musculoskeletal system  Abnormal posture  Muscle weakness (generalized)  Rationale for  Evaluation and Treatment: Rehabilitation  ONSET DATE: 11/21/23  SUBJECTIVE:                                                                                                                                                                                                         SUBJECTIVE STATEMENT: Patient reports that the shoulder is about the same. She has worked on some of her exercises and ordered a pulley - it should be here today or tomorrow. Uses  medication to help with sleep - a strong antiinflammatory.   EVAL: Patient reports that she can't lift her R arm. She has no known injury. Pain started ~ 2-3 months ago. She did get a cortisone shot a couple of weeks ago so it is some better.  Hand dominance: Left  PERTINENT HISTORY:  Cervical fusion C3-7 03/11/16, 05/13/17; cervical stenosis C4/5; R sided lumbar L4/5 decompression and transforminal lumbar interbody fusion with allograft; gastric bypass; anxiety    PAIN:  Are you having pain? Yes: NPRS scale: 5/10; best in past week 3/10; worst 7/10 Pain location: R shoulder  Pain description: throbbing  Aggravating factors: stretching out to side; lifting arm overhead  Relieving factors: not moving arm; injection; meds   PRECAUTIONS: None  WEIGHT BEARING RESTRICTIONS: No  FALLS:  Has patient fallen in last 6 months? 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  DIAGNOSTIC FINDINGS:  CT Scan: IMPRESSION:  1. Anterior cervical fusion from C3 to C7 which is new at the C3-C4 and C4-C5 level since the previous exam. 2. Resection of disc protrusions of the upper cervical spine since the previous exam with continued uncovertebral osteophytes and spurs causing moderate to severe right and moderate left neural foraminal narrowing at C4-C5 with decreased canal  narrowing. 3. No other significant change with mild neural foraminal narrowing and residual endplate osteophytes/spurs most prominently left centrally at C6-C7 and approaching the anterior cord.   PATIENT SURVEYS:  DASH: 55/100  SENSATION: Tingling R hand -  prior to frozen shoulder; from cervical surgery   POSTURE: rounded shoulders, forward head, and increased thoracic kyphosis  PALPATION: Muscular tightness R > L pecs; upper trap; leveator; cervical musculature; thoracic paraspinals Pain  with PA mobs mid thoracic spine    CERVICAL ROM:   Active ROM A/PROM (deg) eval  Flexion 46  Extension 32  Right lateral flexion 32  Left lateral flexion 26  Right rotation 48 tight  Left rotation 52 tight   (Blank rows = not tested)  UPPER EXTREMITY ROM:  Active ROM Right eval Left eval  Shoulder flexion 94 137  Shoulder extension 40 41  Shoulder abduction 95 pain 145  Shoulder adduction    Shoulder extension    Shoulder internal rotation Thumb to waist Thumb T10  Shoulder external rotation 29 53  Elbow flexion    Elbow extension    Wrist flexion    Wrist extension    Wrist ulnar deviation    Wrist radial deviation    Wrist pronation    Wrist supination     (Blank rows = not tested)  UPPER EXTREMITY MMT:  MMT Right eval Left eval  Shoulder flexion 4+ 4+  Shoulder extension 5 5  Shoulder abduction 4 4  Shoulder adduction    Shoulder extension    Shoulder internal rotation 4+ 5  Shoulder external rotation 4 5  Middle trapezius 4 4  Lower trapezius 4 4  Elbow flexion    Elbow extension    Wrist flexion    Wrist extension    Wrist ulnar deviation    Wrist radial deviation    Wrist pronation    Wrist supination    Grip strength     (Blank rows = not tested)   [facility] Adult PT Treatment:                                                DATE: 03/21/24 Therapeutic Exercise: Sitting Pulley flexion 10 sec x 10 Pulley scaption 10 sec x 10  Shoulder horizontal ab/adduction at ~ 80 deg abduction x 15  Standing  Doorway stretch 3 positions 30 sec x 3 each  Supine  Shoulder flexion with dowel 5 sec x 10  Manual Therapy: STM R shoulder girdle PROM R shoulder flexion, scaption, IR/ER in scaption; extension; horizontal  abduction Long arm distraction  Neuromuscular re-ed: Scapular retraction in standing - noodle along spine  Supine T - prolonged stretch ~ 1-2 minutes  Therapeutic Activity: Standing ER with cane noodle along spine elbow flexion at 90 deg and at side 10 sec x 10  Shoulder IR with stretch out strap x 15-20 sec x 5  Row green TB 5 sec x 10  ER green TB 5 sec x 10  IR green TB 5 sec x 10  Supine  Self Care: Encouraged consistent HEP   [facility] Adult PT Treatment:                                                DATE: 03/16/24 Therapeutic Exercise: Sitting Pulley flexion 10 sec x 10 Pulley scaption 10 sec x 10  Shoulder horizontal ab/adduction at ~ 80 deg abduction x 15  Standing  Supine  Shoulder flexion with dowel 5 sec x 10  Manual Therapy: STM R shoulder girdle PROM R shoulder flexion, scaption, IR/ER in scaption; extension; horizontal abduction Long arm distraction  Neuromuscular re-ed: Scapular retraction in standing - noodle along  spine  Supine T - prolonged stretch ~ 1-2 minutes  Therapeutic Activity: Standing ER noodle along spine elbow flexion at 90 deg and at side 10 sec x 10  Shoulder extension with cane 10 sec x 10  Shoulder IR pulling cane across buttocks 10 sec x 10  Isometric step back green TB 5 sec x 10  Isometric step out ER green TB 5 sec x 10  Isometric step out IR green TB 5 sec x 10  Supine  Self Care: Encouraged purchase of pulley for home                                                                                                                                 PATIENT EDUCATION:  Education details: POC; HEP  Person educated: Patient Education method: Programmer, multimedia, Facilities manager, Actor cues, Verbal cues, and Handouts Education comprehension: verbalized understanding, returned demonstration, verbal cues required, tactile cues required, and needs further education  HOME EXERCISE PROGRAM: Access Code: [email or web address] URL:  [email or web address] Date: 03/21/2024 Prepared by: Yandell Mcjunkins  Exercises - Seated Shoulder Flexion AAROM with Pulley Behind  - 2 x daily - 7 x weekly - 1 sets - 10 reps - 10 sec  hold - Seated Shoulder Scaption AAROM with Pulley at Side  - 2 x daily - 7 x weekly - 1 sets - 10 reps - 10sec  hold - Seated Scapular Retraction  - 2 x daily - 7 x weekly - 1-2 sets - 10 reps - 10 sec  hold - Seated Shoulder External Rotation AAROM with Cane and Hand in Neutral  - 2 x daily - 7 x weekly - 1 sets - 5-10 reps - 5-10 sec  hold - Standing Shoulder Extension with Dowel  - 2 x daily - 7 x weekly - 1 sets - 5-10 reps - 5-10 sec  hold - Standing Shoulder Internal Rotation AAROM with Dowel  - 2 x daily - 7 x weekly - 1 sets - 5 reps - 10 sec  hold - Standing Shoulder and Trunk Flexion at Table  - 2 x daily - 7 x weekly - 1 sets - 5 reps - 5 sec  hold - Standing Infraspinatus/Teres Minor Release with Ball at [employer]  - 2 x daily - 7 x weekly - Standing Pectoral Release with Ball at [employer]  - 3-4 x daily - 7 x weekly - Standing Shoulder Row Reactive Isometric  - 1 x daily - 7 x weekly - 1 sets - 10 reps - 5 sec  hold - External Rotation Reactive Isometrics with Flex Bar  - 1 x daily - 7 x weekly - 1-2 sets - 10 reps - 5 sec  hold - Shoulder Internal Rotation Reactive Isometrics  - 1 x daily - 7 x weekly - 1 sets - 10 reps - 5 sec  hold - Supine Shoulder Flexion with Dowel  - 1 x daily - 7 x weekly -  1 sets - 5-10 reps - 3 sec  hold - Standing Bilateral Low Shoulder Row with Anchored Resistance  - 2 x daily - 7 x weekly - 1-3 sets - 10 reps - 2-3 sec  hold - Shoulder External Rotation with Anchored Resistance  - 2 x daily - 7 x weekly - 1-2 sets - 10 reps - 3 sec  hold - Shoulder Internal Rotation with Resistance  - 2 x daily - 7 x weekly - 2 sets - 10 reps - 3 sec  hold - Standing Shoulder Internal Rotation Stretch with Towel  - 2 x daily - 7 x weekly - 1 sets - 5 reps - 15-20 sec  hold  Patient  Education - Scientist, physiological  Patient Education - Scientist, physiological  Patient Education - Office Posture  ASSESSMENT:  CLINICAL IMPRESSION: Patient continues to report no significant change in R shoulder pain. She has worked on some of the exercises at home and ordered a pulley for home. Continued with exercises with focus on ROM and initiated isotonic strengthening. Continued manual work and PROM to improve ROM R shoulder.     EVAL: Patient is a 53 y.o. female who was seen today for physical therapy evaluation and treatment for R adhesive capsulitis which has been present for the past 2-3 months with no known injury. She reports some improvement following injection a couple of weeks ago. Patient presents with R shoulder pain and limited ROM. She has poor posture and alignment; limited ROM/mobility R > L shoulder; weakness; pain with functional activities. Patient will benefit from PT to address problems identified.    OBJECTIVE IMPAIRMENTS: decreased activity tolerance, decreased mobility, decreased ROM, decreased strength, impaired UE functional use, improper body mechanics, postural dysfunction, and pain.    GOALS: Goals reviewed with patient? Yes  SHORT TERM GOALS: Target date: 04/05/2024  Independent in initial HEP  Baseline:  Goal status: INITIAL  2.  Increase AROM R shoulder by 5-10 degrees allowing patient to reach up to shampoo hair and put hair in a ponytail  Baseline:  Goal status: INITIAL  3.  Patient reports and demonatrates proper posture and alignment for sitting at desk/computer and in recliner with modifications  Baseline:  Goal status: INITIAL   LONG TERM GOALS: Target date: 05/03/2024  Improve posture and alignment with patient to demonstrate increased activation of middle and lower trap with increased strength to 5/5  Baseline:  Goal status: INITIAL  2.  Decrease pain R shoulder to 0/10 at rest and no greater than 2-3/10 with elevation of R UE  Baseline:   Goal status: INITIAL  3.  Increase AROM R shoulder to equal to AROM L shoulder in order for patient to use R UE for all functional activities  Baseline:  Goal status: INITIAL  4.  Patient reports ability to use R UE for functional activities overhead with no limitations such as reaching into a cabinet or closet shelf  Baseline:  Goal status: INITIAL  5.  Independent in HEP including aquatic program as indicated  Baseline:  Goal status: INITIAL  6.  Improve DASH by 10-15 points  Baseline: DASH: 55/100 Goal status: INITIAL   PLAN:  PT FREQUENCY: 2x/week  PT DURATION: 8 weeks  PLANNED INTERVENTIONS: 97164- PT Re-evaluation, 97110-Therapeutic exercises, 97530- Therapeutic activity, [id number]- Neuromuscular re-education, 97535- Self Care, [id number]- Manual therapy, [id number]- Aquatic Therapy, [phone]- Ionotophoresis 4mg /ml Dexamethasone , Taping, Dry Needling, Joint mobilization, Cryotherapy, and Moist heat  PLAN FOR NEXT SESSION: review and progress exercises; continue  spine care and ergonomic education; manual work and modalities    Gwendlyon Zumbro P Jameeka Marcy, PT 03/21/2024, 7:20 AM

## 2024-03-22 ENCOUNTER — Encounter: Admitting: Rehabilitative and Restorative Service Providers"

## 2024-03-23 ENCOUNTER — Encounter: Admitting: Rehabilitative and Restorative Service Providers"

## 2024-03-28 ENCOUNTER — Encounter: Admitting: Rehabilitative and Restorative Service Providers"

## 2024-03-30 ENCOUNTER — Encounter: Admitting: Rehabilitative and Restorative Service Providers"

## 2024-04-04 ENCOUNTER — Encounter: Admitting: Rehabilitative and Restorative Service Providers"

## 2024-04-06 ENCOUNTER — Encounter: Admitting: Rehabilitative and Restorative Service Providers"

## 2024-04-07 ENCOUNTER — Other Ambulatory Visit: Payer: Self-pay | Admitting: Sports Medicine

## 2024-04-07 DIAGNOSIS — Z9889 Other specified postprocedural states: Secondary | ICD-10-CM

## 2024-04-08 ENCOUNTER — Other Ambulatory Visit: Payer: Self-pay | Admitting: Sports Medicine

## 2024-04-08 DIAGNOSIS — Z1231 Encounter for screening mammogram for malignant neoplasm of breast: Secondary | ICD-10-CM

## 2024-04-25 ENCOUNTER — Encounter (INDEPENDENT_AMBULATORY_CARE_PROVIDER_SITE_OTHER): Admitting: Sports Medicine

## 2024-04-25 DIAGNOSIS — R739 Hyperglycemia, unspecified: Secondary | ICD-10-CM

## 2024-04-25 DIAGNOSIS — E559 Vitamin D deficiency, unspecified: Secondary | ICD-10-CM

## 2024-04-25 DIAGNOSIS — F3132 Bipolar disorder, current episode depressed, moderate: Secondary | ICD-10-CM

## 2024-04-26 NOTE — Telephone Encounter (Signed)

## 2024-04-28 ENCOUNTER — Ambulatory Visit

## 2024-04-28 DIAGNOSIS — Z1231 Encounter for screening mammogram for malignant neoplasm of breast: Secondary | ICD-10-CM | POA: Diagnosis not present

## 2024-05-03 ENCOUNTER — Ambulatory Visit: Payer: Self-pay | Admitting: Sports Medicine

## 2024-05-13 NOTE — Addendum Note (Signed)
 Addended by: CURTIS DEBBY PARAS on: 05/13/2024 02:16 PM   Modules accepted: Orders

## 2024-05-13 NOTE — Telephone Encounter (Signed)

## 2024-05-22 ENCOUNTER — Other Ambulatory Visit: Payer: Self-pay | Admitting: Sports Medicine

## 2024-05-22 DIAGNOSIS — I10 Essential (primary) hypertension: Secondary | ICD-10-CM

## 2024-05-22 DIAGNOSIS — F3132 Bipolar disorder, current episode depressed, moderate: Secondary | ICD-10-CM

## 2024-05-22 DIAGNOSIS — Z9889 Other specified postprocedural states: Secondary | ICD-10-CM

## 2024-05-25 ENCOUNTER — Ambulatory Visit: Payer: Self-pay | Admitting: Sports Medicine

## 2024-05-25 ENCOUNTER — Telehealth: Payer: Self-pay

## 2024-05-25 LAB — CBC
Hematocrit: 38.9 % (ref 34.0–46.6)
Hemoglobin: 12.3 g/dL (ref 11.1–15.9)
MCH: 29.8 pg (ref 26.6–33.0)
MCHC: 31.6 g/dL (ref 31.5–35.7)
MCV: 94 fL (ref 79–97)
Platelets: 174 x10E3/uL (ref 150–450)
RBC: 4.13 x10E6/uL (ref 3.77–5.28)
RDW: 12.8 % (ref 11.7–15.4)
WBC: 6.5 x10E3/uL (ref 3.4–10.8)

## 2024-05-25 LAB — VITAMIN D 25 HYDROXY (VIT D DEFICIENCY, FRACTURES): Vit D, 25-Hydroxy: 34.9 ng/mL (ref 30.0–100.0)

## 2024-05-25 LAB — LIPID PANEL
Chol/HDL Ratio: 3.6 ratio (ref 0.0–4.4)
Cholesterol, Total: 211 mg/dL — ABNORMAL HIGH (ref 100–199)
HDL: 59 mg/dL (ref >39)
LDL Chol Calc (NIH): 124 mg/dL — ABNORMAL HIGH (ref 0–99)
Triglycerides: 158 mg/dL — ABNORMAL HIGH (ref 0–149)
VLDL Cholesterol Cal: 28 mg/dL (ref 5–40)

## 2024-05-25 LAB — COMPREHENSIVE METABOLIC PANEL WITH GFR
ALT: 16 IU/L (ref 0–32)
AST: 17 IU/L (ref 0–40)
Albumin: 4.1 g/dL (ref 3.8–4.9)
Alkaline Phosphatase: 88 IU/L (ref 44–121)
BUN/Creatinine Ratio: 29 — ABNORMAL HIGH (ref 9–23)
BUN: 19 mg/dL (ref 6–24)
Bilirubin Total: 0.3 mg/dL (ref 0.0–1.2)
CO2: 20 mmol/L (ref 20–29)
Calcium: 9.6 mg/dL (ref 8.7–10.2)
Chloride: 106 mmol/L (ref 96–106)
Creatinine, Ser: 0.66 mg/dL (ref 0.57–1.00)
Globulin, Total: 2.9 g/dL (ref 1.5–4.5)
Glucose: 90 mg/dL (ref 70–99)
Potassium: 4.7 mmol/L (ref 3.5–5.2)
Sodium: 141 mmol/L (ref 134–144)
Total Protein: 7 g/dL (ref 6.0–8.5)
eGFR: 104 mL/min/1.73 (ref >59)

## 2024-05-25 LAB — HEMOGLOBIN A1C
Est. average glucose Bld gHb Est-mCnc: 114 mg/dL
Hgb A1c MFr Bld: 5.6 % (ref 4.8–5.6)

## 2024-05-25 LAB — TSH: TSH: 1.69 u[IU]/mL (ref 0.450–4.500)

## 2024-05-25 NOTE — Telephone Encounter (Signed)
 Low-cholesterol pamphlet sent.

## 2024-06-14 ENCOUNTER — Encounter: Admitting: Sports Medicine

## 2024-06-15 ENCOUNTER — Ambulatory Visit (INDEPENDENT_AMBULATORY_CARE_PROVIDER_SITE_OTHER): Admitting: Urgent Care

## 2024-06-15 ENCOUNTER — Encounter: Payer: Self-pay | Admitting: Urgent Care

## 2024-06-15 VITALS — BP 108/72 | HR 77 | Resp 17 | Ht 66.0 in | Wt 205.5 lb

## 2024-06-15 DIAGNOSIS — E559 Vitamin D deficiency, unspecified: Secondary | ICD-10-CM

## 2024-06-15 DIAGNOSIS — G43909 Migraine, unspecified, not intractable, without status migrainosus: Secondary | ICD-10-CM

## 2024-06-15 DIAGNOSIS — R739 Hyperglycemia, unspecified: Secondary | ICD-10-CM

## 2024-06-15 DIAGNOSIS — F3132 Bipolar disorder, current episode depressed, moderate: Secondary | ICD-10-CM | POA: Diagnosis not present

## 2024-06-15 DIAGNOSIS — I10 Essential (primary) hypertension: Secondary | ICD-10-CM | POA: Diagnosis not present

## 2024-06-15 DIAGNOSIS — Z6833 Body mass index (BMI) 33.0-33.9, adult: Secondary | ICD-10-CM

## 2024-06-15 DIAGNOSIS — Z0000 Encounter for general adult medical examination without abnormal findings: Secondary | ICD-10-CM

## 2024-06-15 MED ORDER — TOPIRAMATE 100 MG PO TABS: 100.0000 mg | ORAL_TABLET | Freq: Two times a day (BID) | ORAL | 1 refills | Status: AC

## 2024-06-15 MED ORDER — ESCITALOPRAM OXALATE 20 MG PO TABS: 20.0000 mg | ORAL_TABLET | Freq: Every day | ORAL | 3 refills | Status: AC

## 2024-06-15 MED ORDER — LOSARTAN POTASSIUM 100 MG PO TABS: 100.0000 mg | ORAL_TABLET | Freq: Every day | ORAL | 3 refills | Status: AC

## 2024-06-15 NOTE — Patient Instructions (Signed)
 We completed your annual physical today. Your cholesterol is mildly elevated - focus on increased physical activity and decreasing cholesterol in your diet. The Mediterranean diet can help with this.  Please look into joining a gym that has a pool as water exercises can help with movement and decreasing pain.  For the topamax , increase slowly. Start with 100mg  in the morning and 50mg  at night for two weeks. After this, increase to 100mg  twice daily.  Follow up in 6 months, sooner as needed.

## 2024-06-15 NOTE — Progress Notes (Signed)
 Complete physical exam  Patient: Denise Gay   DOB: April 17, 1971   52 y.o. Female  MRN: [id number]  Subjective:    Chief Complaint  Patient presents with   Annual Exam    Denise Gay is a 53 y.o. female who presents today for a complete physical exam. She reports consuming a general diet. The patient does not participate in regular exercise at present. She generally feels poorly. She reports sleeping fairly well. She does not have additional problems to discuss today.   Discussed the use of AI scribe software for clinical note transcription with the patient, who gave verbal consent to proceed.  History of Present Illness   Denise Gay is a 53 year old female who presents for follow-up on elevated cholesterol levels.  Recent lab results revealed elevated cholesterol levels with a total cholesterol of 211 mg/dL and LDL cholesterol of 124 mg/dL. Her A1c level, previously in the prediabetic range three years ago, is now normal at 5.6%. Thyroid and vitamin D  levels are normal.  She is on losartan  for blood pressure management. She also takes trazodone and Geodon , prescribed by her previous provider. She does not smoke and is not diabetic. Her diet includes fruits and vegetables, particularly in the summer, and she avoids fast food. She primarily drinks water and does not consume sweet beverages.  Physical activity is limited due to multiple back surgeries, affecting her ability to walk. She occasionally uses a recumbent bike but describes herself as 'kind of lazy'. She experiences sluggishness, attributing it to pain and limited movement. She tries to walk with her husband but not very far. She reports sleeping well on average, though sometimes has trouble falling asleep.  For back pain, she takes Celebrex  daily and uses methocarbamol  and tramadol  as needed. She has previously been on gabapentin  and Lyrica  but stopped due to weight gain concerns. She has a history of frozen shoulder  and uses a pulley at home for exercises. She is on Topamax  for headaches associated with neck pain, which she reports is effective. No weight change since starting Topamax .  She follows up with an eye doctor annually and has regular dental check-ups with no current issues. She completed a Cologuard test in November of last year, which was negative. She also follows up with a gynecologist as needed.      Most recent fall risk assessment:    08/18/2023    9:01 AM  Fall Risk   Falls in the past year? 0  Number falls in past yr: 0  Injury with Fall? 0  Risk for fall due to : No Fall Risks  Follow up Falls evaluation completed     Most recent depression screenings:    06/15/2024    8:00 AM 12/24/2021    9:31 AM  PHQ 2/9 Scores  PHQ - 2 Score 0 4  PHQ- 9 Score 0 16    Vision:Within last year and Dental: No current dental problems and Receives regular dental care  Patient Active Problem List   Diagnosis Date Noted   COVID-19 10/24/2022   Acute generalized abdominal pain 04/11/2022   Trochanteric bursitis, right hip 11/30/2020   Bipolar affective disorder, currently depressed, moderate (HCC) 07/27/2020   Allergic rhinitis 02/03/2020   Hot flashes due to menopause 09/05/2019   Postmenopausal hormone therapy 09/05/2019   Otitis media 09/01/2016   Obesity 03/10/2016   Spinal stenosis of lumbar region 01/18/2016   Progressive focal motor weakness 01/15/2016   H/O C3-C7  Fusion 04/30/2015   Annual physical exam 04/30/2015   History of Roux-en-Y gastric bypass 11/28/2014   Essential hypertension, benign 11/28/2014   Past Medical History:  Diagnosis Date   Asthma    as a child   Diabetes mellitus without complication (HCC)    was before gastric bypass but patient is not taking any medications for that   Gastric bypass status for obesity    History of staph infection    from previous lumbar back surgery   Hypertension    Mononucleosis    Sleep apnea    before gastric bypass no  longer wearing CPAP   Spinal stenosis of lumbar region 01/18/2016   Past Surgical History:  Procedure Laterality Date   ANTERIOR CERVICAL DECOMP/DISCECTOMY FUSION N/A 05/13/2017   Procedure: ANTERIOR CERVICAL DECOMPRESSION FUSION, CERVICAL 3-4, CERVICAL 4-5 WITH INSTRUMENTATION AND ALLOGRAFT; HARDWARE REMOVAL AT CERVICAL 5-6, CERVICAL 6-7.;  Surgeon: Beuford Anes, MD;  Location: [facility] OR;  Service: Orthopedics;  Laterality: N/A;  ANTERIOR CERVICAL DECOMPRESSION FUSION, CERVICAL 3-4, CERVICAL 4-5 WITH INSTRUMENTATION AND ALLOGRAFT; HARDWARE REMOVAL AT CERVICAL 5-6, CERVICAL 6-   CERVICAL LAMINECTOMY     CHOLECYSTECTOMY  08/2020   DIAGNOSTIC LAPAROSCOPY     GASTRIC BYPASS  08/2011   lumber surgery     REDUCTION MAMMAPLASTY Bilateral    TRANSFORAMINAL LUMBAR INTERBODY FUSION (TLIF) WITH PEDICLE SCREW FIXATION 1 LEVEL Right 03/07/2021   Procedure: RIGHT-SIDED LUMBAR 4- LUMBAR 5 DECOMPRESSION AND TRANSFORAMINAL LUMBAR INTERBODY FUSION WITH INSTRUMENTATION AND ALLOGRAFT;  Surgeon: Beuford Anes, MD;  Location: [facility] OR;  Service: Orthopedics;  Laterality: Right;   Social History   Tobacco Use   Smoking status: Former    Current packs/day: 0.00    Types: Cigarettes    Quit date: 12/18/2005    Years since quitting: 18.5   Smokeless tobacco: Never  Vaping Use   Vaping status: Never Used  Substance Use Topics   Alcohol use: No    Alcohol/week: 0.0 standard drinks of alcohol   Drug use: No      Patient Care Team: Lowella Benton CROME, [place] as PCP - General (Physician Assistant) Gillie Duncans, MD as Consulting Physician (Neurosurgery)   Outpatient Medications Prior to Visit  Medication Sig   Biotin 5000 MCG SUBL Place 5,000 mcg under the tongue daily.   celecoxib  (CELEBREX ) 200 MG capsule TAKE 1 TO 2 TABLETS BY MOUTH DAILY AS NEEDED FOR PAIN   cetirizine (ZYRTEC) 10 MG tablet Take 10 mg by mouth daily.   fluticasone  (FLONASE ) 50 MCG/ACT nasal spray USE ONE SPRAY IN EACH NOSTRIL TWICE A DAY    methocarbamol  (ROBAXIN ) 500 MG tablet TAKE ONE TABLET BY MOUTH 2 TIMES DAILY.   Multiple Vitamins-Minerals (MULTIVITAMIN WITH MINERALS) tablet Take 1 tablet by mouth daily.   mupirocin  ointment (BACTROBAN ) 2 % Apply 1 application topically 2 (two) times daily.   omeprazole (PRILOSEC) 20 MG capsule Take 20 mg by mouth daily.   prazosin  (MINIPRESS ) 2 MG capsule Take 2 mg by mouth at bedtime.   Simethicone  180 MG CAPS Take 1 capsule (180 mg total) by mouth 4 (four) times daily as needed (gas/flatulance/bloating).   traMADol  (ULTRAM ) 50 MG tablet Take 1 tablet (50 mg total) by mouth every 8 (eight) hours as needed for moderate pain (pain score 4-6).   traZODone (DESYREL) 100 MG tablet Take 100 mg by mouth at bedtime as needed.   ziprasidone  (GEODON ) 20 MG capsule Take 40 mg by mouth 2 (two) times daily.   [DISCONTINUED] benzonatate  (TESSALON )  100 MG capsule Take 1 capsule (100 mg total) by mouth 3 (three) times daily as needed for cough.   [DISCONTINUED] escitalopram  (LEXAPRO ) 20 MG tablet TAKE 1 TABLET BY MOUTH EVERY DAY   [DISCONTINUED] gabapentin  (NEURONTIN ) 600 MG tablet Take 1 tablet (600 mg total) by mouth at bedtime. (Patient not taking: Reported on 11/15/2020)   [DISCONTINUED] losartan  (COZAAR ) 100 MG tablet TAKE 1 TABLET BY MOUTH EVERY DAY   [DISCONTINUED] pregabalin  (LYRICA ) 100 MG capsule Take 1 capsule (100 mg total) by mouth 3 (three) times daily.   [DISCONTINUED] topiramate  (TOPAMAX ) 50 MG tablet TAKE 1 TABLET BY MOUTH TWICE A DAY   No facility-administered medications prior to visit.    ROS Complete 12 point ROS performed with all pertinent positives listed in HPI      Objective:     BP 108/72   Pulse 77   Resp 17   Ht 5' 6 (1.676 m)   Wt 205 lb 8 oz (93.2 kg)   SpO2 97%   BMI 33.17 kg/m  BP Readings from Last 3 Encounters:  06/15/24 108/72  10/20/23 (!) 150/85  09/30/23 130/82   Wt Readings from Last 3 Encounters:  06/15/24 205 lb 8 oz (93.2 kg)  08/18/23 199 lb  (90.3 kg)  04/07/23 196 lb (88.9 kg)      Physical Exam Vitals and nursing note reviewed.  Constitutional:      General: She is not in acute distress.    Appearance: Normal appearance. She is not ill-appearing, toxic-appearing or diaphoretic.  HENT:     Head: Normocephalic and atraumatic.     Right Ear: Tympanic membrane, ear canal and external ear normal. There is no impacted cerumen.     Left Ear: Tympanic membrane, ear canal and external ear normal. There is no impacted cerumen.     Nose: Nose normal.     Mouth/Throat:     Mouth: Mucous membranes are moist.     Pharynx: Oropharynx is clear. No oropharyngeal exudate or posterior oropharyngeal erythema.  Eyes:     General: No scleral icterus.       Right eye: No discharge.        Left eye: No discharge.     Extraocular Movements: Extraocular movements intact.     Pupils: Pupils are equal, round, and reactive to light.  Neck:     Thyroid: No thyroid mass, thyromegaly or thyroid tenderness.  Cardiovascular:     Rate and Rhythm: Normal rate and regular rhythm.     Pulses: Normal pulses.     Heart sounds: No murmur heard. Pulmonary:     Effort: Pulmonary effort is normal. No respiratory distress.     Breath sounds: Normal breath sounds. No stridor. No wheezing or rhonchi.  Abdominal:     General: Abdomen is flat. Bowel sounds are normal. There is no distension.     Palpations: Abdomen is soft. There is no mass.     Tenderness: There is no abdominal tenderness. There is no guarding.  Musculoskeletal:     Cervical back: Normal range of motion and neck supple. No rigidity or tenderness.     Right lower leg: No edema.     Left lower leg: No edema.  Lymphadenopathy:     Cervical: No cervical adenopathy.  Skin:    General: Skin is warm and dry.     Coloration: Skin is not jaundiced.     Findings: No bruising, erythema or rash.  Neurological:     General:  No focal deficit present.     Mental Status: She is alert and oriented to  person, place, and time.     Sensory: No sensory deficit.     Motor: No weakness.  Psychiatric:        Mood and Affect: Mood normal.        Behavior: Behavior normal.      No results found for any visits on 06/15/24.     Assessment & Plan:    Routine Health Maintenance and Physical Exam  Immunization History  Administered Date(s) Administered   Hepb-cpg 03/31/2024, 05/06/2024   Influenza, Seasonal, Injecte, Preservative Fre 07/21/2016   Influenza,inj,Quad PF,6+ Mos 07/21/2016, 06/09/2019, 07/23/2020   Influenza-Unspecified 06/21/2019, 08/10/2021, 07/17/2023   Tdap 01/19/2017, 03/31/2024   Zoster Recombinant(Shingrix) 10/10/2021, 12/24/2021, 03/31/2024    Health Maintenance  Topic Date Due   COVID-19 Vaccine (1 - 2024-25 season) Never done   INFLUENZA VACCINE  05/20/2024   Pneumococcal Vaccine: 50+ Years (1 of 1 - PCV) 06/15/2025 (Originally 07/17/2019)   Hepatitis C Screening  06/15/2025 (Originally 07/17/1989)   MAMMOGRAM  04/28/2026   Cervical Cancer Screening (HPV/Pap Cotest)  08/17/2028   Colonoscopy  09/06/2033   DTaP/Tdap/Td (3 - Td or Tdap) 03/31/2034   Hepatitis B Vaccines 19-59 Average Risk  Completed   HIV Screening  Completed   Zoster Vaccines- Shingrix  Completed   HPV VACCINES  Aged Out   Meningococcal B Vaccine  Aged Out    Discussed health benefits of physical activity, and encouraged her to engage in regular exercise appropriate for her age and condition.  Problem List Items Addressed This Visit     Bipolar affective disorder, currently depressed, moderate (HCC)   Relevant Medications   escitalopram  (LEXAPRO ) 20 MG tablet   Essential hypertension, benign   Relevant Medications   losartan  (COZAAR ) 100 MG tablet   Other Visit Diagnoses       Routine adult health maintenance    -  Primary     Hyperglycemia         Vitamin D  deficiency         BMI 33.0-33.9,adult       Relevant Medications   topiramate  (TOPAMAX ) 100 MG tablet     Migraine  syndrome       Relevant Medications   escitalopram  (LEXAPRO ) 20 MG tablet   losartan  (COZAAR ) 100 MG tablet   topiramate  (TOPAMAX ) 100 MG tablet      Return in about 6 months (around 12/16/2024). Assessment and Plan    Adult wellness exam Completed today Vaccinations currently deferred. Mammo, pap, colonoscopy all UTD  Hyperlipidemia Mildly elevated cholesterol levels with no indication for statin therapy based on ASCVD risk score. (1.7%) - Encourage low cholesterol diet. - Consider fish oil supplementation.  Chronic back pain Chronic back pain managed with Celebrex , methocarbamol , and tramadol . Gabapentin  and Lyrica  discontinued due to weight gain. Aquatic physical therapy discussed. - Continue Celebrex  daily. - Use methocarbamol  as needed. - Use tramadol  as needed. - Consider aquatic physical therapy if insurance covers it.  Obesity Obesity with limited physical activity due to back pain. Discussed dietary habits and exercise limitations. - Encourage use of recumbent bike in small increments. - Consider aquatic physical therapy. - will increase topamax  to 200mg  daily to see if this helps with mild weight loss  Hypertension Hypertension well-controlled with losartan . - Continue losartan . - Refill losartan  for 90 days with three refills.  Chronic headache and chronic neck pain Chronic headaches managed with Topamax . Discussed  increasing dosage for potential weight loss benefits. - Increase Topamax  to 100 mg twice daily after tapering. - Refill Topamax  with new dosage instructions.  Frozen shoulder Frozen shoulder previously managed with physical therapy. Uses tramadol  for pain. - Use tramadol  as needed for shoulder pain.          Benton LITTIE Gave, PA

## 2024-06-21 ENCOUNTER — Encounter: Payer: Self-pay | Admitting: Sports Medicine

## 2024-06-24 ENCOUNTER — Other Ambulatory Visit: Payer: Self-pay

## 2024-06-24 DIAGNOSIS — Z9889 Other specified postprocedural states: Secondary | ICD-10-CM

## 2024-06-24 MED ORDER — METHOCARBAMOL 500 MG PO TABS: 500.0000 mg | ORAL_TABLET | Freq: Four times a day (QID) | ORAL | 2 refills | Status: DC | PRN

## 2024-06-24 NOTE — Telephone Encounter (Signed)
 Last fill: 05/23/24 #[id number]

## 2024-11-10 ENCOUNTER — Other Ambulatory Visit: Payer: Self-pay | Admitting: Urgent Care

## 2024-11-10 DIAGNOSIS — Z9889 Other specified postprocedural states: Secondary | ICD-10-CM

## 2024-11-23 ENCOUNTER — Encounter: Payer: Self-pay | Admitting: Urgent Care

## 2024-11-23 MED ORDER — OMEPRAZOLE 20 MG PO CPDR: 20.0000 mg | DELAYED_RELEASE_CAPSULE | Freq: Every day | ORAL | 1 refills | Status: AC

## 2024-11-23 NOTE — Telephone Encounter (Signed)
 Patient requesting rx rf of Omeprazole  20mg   Last written by historical provider  Last OV 06/15/2024 Upcoming appt 12/16/2024

## 2024-12-16 ENCOUNTER — Ambulatory Visit: Admitting: Urgent Care
